# Patient Record
Sex: Male | Born: 1940 | Race: White | Hispanic: No | Marital: Married | Smoking: Former smoker
Health system: Southern US, Community
[De-identification: ages and names within clinical notes are randomized; demographics above are authoritative.]

## PROBLEM LIST (undated history)

## (undated) DIAGNOSIS — M1990 Unspecified osteoarthritis, unspecified site: Secondary | ICD-10-CM

## (undated) DIAGNOSIS — T7840XA Allergy, unspecified, initial encounter: Secondary | ICD-10-CM

## (undated) DIAGNOSIS — R3129 Other microscopic hematuria: Secondary | ICD-10-CM

## (undated) DIAGNOSIS — Z87438 Personal history of other diseases of male genital organs: Secondary | ICD-10-CM

## (undated) DIAGNOSIS — K219 Gastro-esophageal reflux disease without esophagitis: Secondary | ICD-10-CM

## (undated) DIAGNOSIS — Z8719 Personal history of other diseases of the digestive system: Secondary | ICD-10-CM

## (undated) DIAGNOSIS — G4730 Sleep apnea, unspecified: Secondary | ICD-10-CM

## (undated) DIAGNOSIS — I491 Atrial premature depolarization: Secondary | ICD-10-CM

## (undated) DIAGNOSIS — Z83719 Family history of colon polyps, unspecified: Secondary | ICD-10-CM

## (undated) DIAGNOSIS — I493 Ventricular premature depolarization: Secondary | ICD-10-CM

## (undated) DIAGNOSIS — Z87442 Personal history of urinary calculi: Secondary | ICD-10-CM

## (undated) DIAGNOSIS — N400 Enlarged prostate without lower urinary tract symptoms: Secondary | ICD-10-CM

## (undated) DIAGNOSIS — H269 Unspecified cataract: Secondary | ICD-10-CM

## (undated) DIAGNOSIS — Z973 Presence of spectacles and contact lenses: Secondary | ICD-10-CM

## (undated) DIAGNOSIS — I10 Essential (primary) hypertension: Secondary | ICD-10-CM

## (undated) DIAGNOSIS — Z8371 Family history of colonic polyps: Secondary | ICD-10-CM

## (undated) DIAGNOSIS — N200 Calculus of kidney: Secondary | ICD-10-CM

## (undated) DIAGNOSIS — G4733 Obstructive sleep apnea (adult) (pediatric): Secondary | ICD-10-CM

## (undated) HISTORY — PX: BACK SURGERY: SHX140

## (undated) HISTORY — DX: Atrial premature depolarization: I49.1

## (undated) HISTORY — PX: TRACHEOSTOMY: SUR1362

## (undated) HISTORY — DX: Family history of colon polyps, unspecified: Z83.719

## (undated) HISTORY — DX: Family history of colonic polyps: Z83.71

## (undated) HISTORY — DX: Essential (primary) hypertension: I10

## (undated) HISTORY — DX: Allergy, unspecified, initial encounter: T78.40XA

## (undated) HISTORY — DX: Obstructive sleep apnea (adult) (pediatric): G47.33

## (undated) HISTORY — DX: Unspecified cataract: H26.9

## (undated) HISTORY — DX: Ventricular premature depolarization: I49.3

## (undated) HISTORY — DX: Sleep apnea, unspecified: G47.30

## (undated) HISTORY — PX: TONSILLECTOMY: SUR1361

---

## 1994-04-04 HISTORY — PX: ANTERIOR CERVICAL DECOMP/DISCECTOMY FUSION: SHX1161

## 1998-07-17 ENCOUNTER — Ambulatory Visit (HOSPITAL_COMMUNITY): Admission: RE | Admit: 1998-07-17 | Discharge: 1998-07-17 | Payer: Self-pay | Admitting: *Deleted

## 2001-04-04 HISTORY — PX: WRIST GANGLION EXCISION: SUR520

## 2002-09-09 ENCOUNTER — Emergency Department (HOSPITAL_COMMUNITY): Admission: EM | Admit: 2002-09-09 | Discharge: 2002-09-09 | Payer: Self-pay | Admitting: Emergency Medicine

## 2002-09-09 ENCOUNTER — Encounter: Payer: Self-pay | Admitting: Emergency Medicine

## 2002-11-19 ENCOUNTER — Encounter: Admission: RE | Admit: 2002-11-19 | Discharge: 2003-02-17 | Payer: Self-pay | Admitting: Orthopedic Surgery

## 2003-12-30 ENCOUNTER — Encounter: Admission: RE | Admit: 2003-12-30 | Discharge: 2003-12-30 | Payer: Self-pay | Admitting: Family Medicine

## 2004-02-04 ENCOUNTER — Inpatient Hospital Stay (HOSPITAL_COMMUNITY): Admission: AD | Admit: 2004-02-04 | Discharge: 2004-02-05 | Payer: Self-pay | Admitting: Neurosurgery

## 2004-02-04 HISTORY — PX: CERVICAL LAMINECTOMY: SHX94

## 2004-06-07 ENCOUNTER — Ambulatory Visit: Payer: Self-pay | Admitting: Family Medicine

## 2004-10-27 ENCOUNTER — Ambulatory Visit: Payer: Self-pay | Admitting: Family Medicine

## 2004-11-08 ENCOUNTER — Ambulatory Visit: Payer: Self-pay | Admitting: Family Medicine

## 2004-11-17 ENCOUNTER — Encounter: Admission: RE | Admit: 2004-11-17 | Discharge: 2004-11-17 | Payer: Self-pay | Admitting: Family Medicine

## 2004-11-23 ENCOUNTER — Ambulatory Visit: Payer: Self-pay | Admitting: Gastroenterology

## 2004-12-03 ENCOUNTER — Encounter (INDEPENDENT_AMBULATORY_CARE_PROVIDER_SITE_OTHER): Payer: Self-pay | Admitting: *Deleted

## 2004-12-03 ENCOUNTER — Ambulatory Visit: Payer: Self-pay | Admitting: Gastroenterology

## 2005-03-01 ENCOUNTER — Ambulatory Visit: Payer: Self-pay | Admitting: Family Medicine

## 2005-04-04 HISTORY — PX: OTHER SURGICAL HISTORY: SHX169

## 2005-11-09 ENCOUNTER — Ambulatory Visit: Payer: Self-pay | Admitting: Family Medicine

## 2005-12-27 ENCOUNTER — Ambulatory Visit: Payer: Self-pay | Admitting: Family Medicine

## 2006-12-27 ENCOUNTER — Telehealth: Payer: Self-pay | Admitting: Family Medicine

## 2006-12-29 ENCOUNTER — Encounter: Payer: Self-pay | Admitting: Family Medicine

## 2007-08-20 ENCOUNTER — Encounter: Payer: Self-pay | Admitting: Family Medicine

## 2007-08-21 ENCOUNTER — Encounter: Payer: Self-pay | Admitting: Family Medicine

## 2007-08-28 ENCOUNTER — Ambulatory Visit: Payer: Self-pay | Admitting: Family Medicine

## 2007-08-28 DIAGNOSIS — Z86010 Personal history of colonic polyps: Secondary | ICD-10-CM

## 2007-08-28 DIAGNOSIS — N410 Acute prostatitis: Secondary | ICD-10-CM

## 2007-08-28 DIAGNOSIS — Z87442 Personal history of urinary calculi: Secondary | ICD-10-CM

## 2007-08-28 LAB — CONVERTED CEMR LAB
Bilirubin Urine: NEGATIVE
Blood in Urine, dipstick: NEGATIVE
Glucose, Urine, Semiquant: NEGATIVE
Ketones, urine, test strip: NEGATIVE
Nitrite: NEGATIVE
Protein, U semiquant: NEGATIVE
Specific Gravity, Urine: <1.005
Urobilinogen, UA: 0.2
WBC Urine, dipstick: NEGATIVE
pH: 5.5

## 2007-09-03 ENCOUNTER — Ambulatory Visit: Payer: Self-pay | Admitting: Family Medicine

## 2007-09-03 LAB — CONVERTED CEMR LAB
Bilirubin Urine: NEGATIVE
Glucose, Urine, Semiquant: NEGATIVE
Ketones, urine, test strip: NEGATIVE
Nitrite: NEGATIVE
Protein, U semiquant: NEGATIVE
Specific Gravity, Urine: <1.005
Urobilinogen, UA: 0.2
WBC Urine, dipstick: NEGATIVE
pH: 6

## 2007-09-04 ENCOUNTER — Encounter: Payer: Self-pay | Admitting: Family Medicine

## 2008-01-01 ENCOUNTER — Encounter: Payer: Self-pay | Admitting: Family Medicine

## 2009-01-08 ENCOUNTER — Ambulatory Visit: Payer: Self-pay | Admitting: Family Medicine

## 2009-03-02 ENCOUNTER — Ambulatory Visit: Payer: Self-pay | Admitting: Family Medicine

## 2009-03-02 DIAGNOSIS — N401 Enlarged prostate with lower urinary tract symptoms: Secondary | ICD-10-CM

## 2009-03-02 DIAGNOSIS — N138 Other obstructive and reflux uropathy: Secondary | ICD-10-CM | POA: Insufficient documentation

## 2009-03-02 DIAGNOSIS — K625 Hemorrhage of anus and rectum: Secondary | ICD-10-CM

## 2009-03-04 LAB — CONVERTED CEMR LAB: PSA: 1.26 ng/mL (ref 0.10–4.00)

## 2009-03-12 ENCOUNTER — Encounter (INDEPENDENT_AMBULATORY_CARE_PROVIDER_SITE_OTHER): Payer: Self-pay | Admitting: *Deleted

## 2009-04-10 ENCOUNTER — Ambulatory Visit: Payer: Self-pay | Admitting: Gastroenterology

## 2009-04-10 LAB — CONVERTED CEMR LAB
Basophils Absolute: 0 10*3/uL (ref 0.0–0.1)
Basophils Relative: 0.7 % (ref 0.0–3.0)
Eosinophils Absolute: 0.2 10*3/uL (ref 0.0–0.7)
Eosinophils Relative: 5.4 % — ABNORMAL HIGH (ref 0.0–5.0)
HCT: 48.7 % (ref 39.0–52.0)
Hemoglobin: 16 g/dL (ref 13.0–17.0)
Lymphocytes Relative: 21.6 % (ref 12.0–46.0)
Lymphs Abs: 0.9 10*3/uL (ref 0.7–4.0)
MCHC: 32.7 g/dL (ref 30.0–36.0)
MCV: 94.3 fL (ref 78.0–100.0)
Monocytes Absolute: 0.4 10*3/uL (ref 0.1–1.0)
Monocytes Relative: 10.7 % (ref 3.0–12.0)
Neutro Abs: 2.7 10*3/uL (ref 1.4–7.7)
Neutrophils Relative %: 61.6 % (ref 43.0–77.0)
Platelets: 157 10*3/uL (ref 150.0–400.0)
RBC: 5.17 M/uL (ref 4.22–5.81)
RDW: 12.1 % (ref 11.5–14.6)
WBC: 4.2 10*3/uL — ABNORMAL LOW (ref 4.5–10.5)

## 2009-04-15 ENCOUNTER — Ambulatory Visit: Payer: Self-pay | Admitting: Gastroenterology

## 2009-04-15 LAB — HM COLONOSCOPY

## 2009-04-16 ENCOUNTER — Encounter: Payer: Self-pay | Admitting: Gastroenterology

## 2009-04-21 ENCOUNTER — Encounter: Payer: Self-pay | Admitting: Gastroenterology

## 2009-04-24 ENCOUNTER — Ambulatory Visit: Payer: Self-pay | Admitting: Family Medicine

## 2009-04-24 DIAGNOSIS — K5190 Ulcerative colitis, unspecified, without complications: Secondary | ICD-10-CM | POA: Insufficient documentation

## 2009-04-24 DIAGNOSIS — M1990 Unspecified osteoarthritis, unspecified site: Secondary | ICD-10-CM

## 2009-05-22 ENCOUNTER — Ambulatory Visit: Payer: Self-pay | Admitting: Gastroenterology

## 2009-07-09 ENCOUNTER — Encounter (INDEPENDENT_AMBULATORY_CARE_PROVIDER_SITE_OTHER): Payer: Self-pay | Admitting: *Deleted

## 2009-08-10 ENCOUNTER — Ambulatory Visit: Payer: Self-pay | Admitting: Family Medicine

## 2009-08-10 DIAGNOSIS — J019 Acute sinusitis, unspecified: Secondary | ICD-10-CM

## 2009-08-26 ENCOUNTER — Ambulatory Visit: Payer: Self-pay | Admitting: Gastroenterology

## 2010-05-02 LAB — CONVERTED CEMR LAB
ALT: 22 units/L (ref 0–53)
AST: 20 units/L (ref 0–37)
Albumin: 4.3 g/dL (ref 3.5–5.2)
Alkaline Phosphatase: 72 units/L (ref 39–117)
BUN: 22 mg/dL (ref 6–23)
Basophils Absolute: 0 10*3/uL (ref 0.0–0.1)
Basophils Relative: 0.3 % (ref 0.0–3.0)
Bilirubin, Direct: 0.2 mg/dL (ref 0.0–0.3)
CO2: 32 meq/L (ref 19–32)
Calcium: 9.5 mg/dL (ref 8.4–10.5)
Chloride: 105 meq/L (ref 96–112)
Cholesterol: 185 mg/dL (ref 0–200)
Creatinine, Ser: 0.8 mg/dL (ref 0.4–1.5)
Eosinophils Absolute: 0.2 10*3/uL (ref 0.0–0.7)
Eosinophils Relative: 5.2 % — ABNORMAL HIGH (ref 0.0–5.0)
GFR calc non Af Amer: 101.96 mL/min (ref >60)
Glucose, Bld: 85 mg/dL (ref 70–99)
HCT: 47.6 % (ref 39.0–52.0)
HDL: 40.4 mg/dL (ref >39.00)
Hemoglobin: 15.7 g/dL (ref 13.0–17.0)
LDL Cholesterol: 133 mg/dL — ABNORMAL HIGH (ref 0–99)
Lymphocytes Relative: 21.3 % (ref 12.0–46.0)
Lymphs Abs: 0.9 10*3/uL (ref 0.7–4.0)
MCHC: 33.1 g/dL (ref 30.0–36.0)
MCV: 95 fL (ref 78.0–100.0)
Monocytes Absolute: 0.4 10*3/uL (ref 0.1–1.0)
Monocytes Relative: 10.6 % (ref 3.0–12.0)
Neutro Abs: 2.5 10*3/uL (ref 1.4–7.7)
Neutrophils Relative %: 62.6 % (ref 43.0–77.0)
Platelets: 158 10*3/uL (ref 150.0–400.0)
Potassium: 4 meq/L (ref 3.5–5.1)
RBC: 5.01 M/uL (ref 4.22–5.81)
RDW: 12 % (ref 11.5–14.6)
Sodium: 142 meq/L (ref 135–145)
TSH: 0.96 microintl units/mL (ref 0.35–5.50)
Total Bilirubin: 1.1 mg/dL (ref 0.3–1.2)
Total CHOL/HDL Ratio: 5
Total Protein: 7 g/dL (ref 6.0–8.3)
Triglycerides: 59 mg/dL (ref 0.0–149.0)
VLDL: 11.8 mg/dL (ref 0.0–40.0)
WBC: 4 10*3/uL — ABNORMAL LOW (ref 4.5–10.5)

## 2010-05-04 NOTE — Procedures (Signed)
Summary: Colonoscopy  Patient: Alex Weiss Note: All result statuses are Final unless otherwise noted.  Tests: (1) Colonoscopy (COL)   COL Colonoscopy           DONE     [facility]     [address]     [place], [place]  [address]           COLONOSCOPY PROCEDURE REPORT           PATIENT:  Alex Weiss  MR#:  [id number]     BIRTHDATE:  November 27, 1940, 68 yrs. old  GENDER:  male     ENDOSCOPIST:  Rachael Fee, MD           PROCEDURE DATE:  04/15/2009     PROCEDURE:  Colonoscopy with biopsy     ASA CLASS:  Class II     INDICATIONS:  recent change in bowel habits, intermitten bleeding     and urgency; brother had colon cancer; had 2006 colonoscopy     without polyps, cancers           MEDICATIONS:   Fentanyl 50 mcg IV, Versed 7 mg IV           DESCRIPTION OF PROCEDURE:   After the risks benefits and     alternatives of the procedure were thoroughly explained, informed     consent was obtained.  Digital rectal exam was performed and     revealed no rectal masses.   The LB [id number] endoscope     was introduced through the anus and advanced to the terminal ileum     which was intubated for a short distance, without limitations.     The quality of the prep was good, using MoviPrep.  The instrument     was then slowly withdrawn as the colon was fully examined.     <<PROCEDUREIMAGES>>           FINDINGS: There was moderate inflammation from anus to 30cm from     anus at which point there was a fairly distinct transition to     normal appearing mucosa. In the affected area, the mucosa was     erythematous, friable, granula appearing. There were no ulcers     (see image7). Biopsies were taken and sent to pathology  Mild     diverticulosis was found sigmoid to descending colon segments.     The terminal ileum appeared normal (see image5).  This was     otherwise a normal examination of the colon (see image4 and     image3).   Retroflexed views in the rectum revealed  no     abnormalities.    The scope was then withdrawn from the patient     and the procedure completed.           COMPLICATIONS:  None           ENDOSCOPIC IMPRESSION:     1) Moderate appearing colitis from anus to 30cm from anus.     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  Biopsies were taken.     2) Mild diverticulosis in the sigmoid to descending colon     segments.     3) Normal terminal ileum     4) Otherwise normal examination           RECOMMENDATIONS:     Given your significant family history of colon cancer, you     should have a repeat colonoscopy in 5 years  Dr. Christella Hartigan has called in Rowasa enemas, you should use one enema     at bedtime every night.     Dr. Christella Hartigan office will get in touch with you about a return     office visit in 4-5 weeks to see how the new medicine is helping.                 REPEAT EXAM:  5 years           ______________________________     Rachael Fee, MD           cc: Gershon Crane, MD           n.     Rosalie Doctor:   Rachael Fee at 04/15/2009 02:20 PM           [name], [id number]  Note: An exclamation mark (!) indicates a result that was not dispersed into the flowsheet. Document Creation Date: 04/15/2009 2:20 PM _______________________________________________________________________  (1) Order result status: Final Collection or observation date-time: 04/15/2009 14:11 Requested date-time:  Receipt date-time:  Reported date-time:  Referring Physician:   Ordering Physician: Rob Bunting [phone]) Specimen Source:  Source: Launa Grill Order Number: [id number] Lab site:   Appended Document: Colonoscopy     Procedures Next Due Date:    Colonoscopy: 04/2014

## 2010-05-04 NOTE — Assessment & Plan Note (Signed)
Summary: EMP//SLM   Vital Signs:  Patient profile:   70 year old male Height:      73 inches Weight:      218 pounds Temp:     97.8 degrees F oral Pulse rate:   74 / minute BP sitting:   134 / 90  (left arm) Cuff size:   large  Vitals Entered By: Alfred Levins, CMA (April 24, 2009 9:00 AM) CC: cpx, fasting   History of Present Illness: 70 yr old male for cpx. He recently had a colonoscopy for bloody stools which showed chronic colitis with no polyps, and hewas started by Dr. Christella Hartigan on nightly Rowasa enemas. These have been succesful, and he feels fine. He had a normal PSA on 03-02-09. Still walks for exercise.  Current Medications (verified): 1)  Sulindac 200 Mg  Tabs (Sulindac) .... Once Daily 2)  Skelaxin 800 Mg Tabs (Metaxalone) .... Take 1/2 Tablet By Mouth As Needed 3)  Folic Acid 1 Mg Tabs (Folic Acid) .... Once Daily 4)  Multivitamins  Tabs (Multiple Vitamin) .... Once Daily 5)  Aspir-Low 81 Mg Tbec (Aspirin) .... Once Daily 6)  Moviprep 100 Gm  Solr (Peg-Kcl-Nacl-Nasulf-Na Asc-C) .... As Per Prep Instructions. 7)  Rowasa 4 Gm  Kit (Mesalamine-Cleanser) .... Take One Enema Nightly, At Bedtime  Allergies (verified): 1)  ! Pcn 2)  ! * Ivp Dye  Past History:  Past Medical History: Nephrolithiasis, hx of Colonic polyps, hx of Chickenpox ulcerative colitis, sees Dr. Wendall Papa  Past Surgical History: Colonoscopy 04-15-09 per Dr. Christella Hartigan, showed chronic colitis with no polyps, repeat in 5 yrs Tendon repair right  wrist C4 & C5 fusion Tracheostomy Tonsillectomy  Family History: Reviewed history from 04/10/2009 and no changes required. Family History of Arthritis Family History of CAD Male 1st degree relative <50 Family History Depression Family History Hypertension Family History Lung cancer Family History of Stroke M 1st degree relative <50 Son committed suicide brother had colon cancer in his late 84s  Social History: Reviewed history from 04/10/2009 and  no changes required. Married Alcohol use-yes Drug use-no Former smoker   Review of Systems  The patient denies anorexia, fever, weight loss, weight gain, vision loss, decreased hearing, hoarseness, chest pain, syncope, dyspnea on exertion, peripheral edema, prolonged cough, headaches, hemoptysis, abdominal pain, melena, hematochezia, severe indigestion/heartburn, hematuria, incontinence, genital sores, muscle weakness, suspicious skin lesions, transient blindness, difficulty walking, depression, unusual weight change, abnormal bleeding, enlarged lymph nodes, angioedema, breast masses, and testicular masses.    Physical Exam  General:  Well-developed,well-nourished,in no acute distress; alert,appropriate and cooperative throughout examination Head:  Normocephalic and atraumatic without obvious abnormalities. No apparent alopecia or balding. Eyes:  No corneal or conjunctival inflammation noted. EOMI. Perrla. Funduscopic exam benign, without hemorrhages, exudates or papilledema. Vision grossly normal. Ears:  External ear exam shows no significant lesions or deformities.  Otoscopic examination reveals clear canals, tympanic membranes are intact bilaterally without bulging, retraction, inflammation or discharge. Hearing is grossly normal bilaterally. Nose:  External nasal examination shows no deformity or inflammation. Nasal mucosa are pink and moist without lesions or exudates. Mouth:  Oral mucosa and oropharynx without lesions or exudates.  Teeth in good repair. Neck:  No deformities, masses, or tenderness noted. Chest Wall:  No deformities, masses, tenderness or gynecomastia noted. Lungs:  Normal respiratory effort, chest expands symmetrically. Lungs are clear to auscultation, no crackles or wheezes. Heart:  Normal rate and regular rhythm. S1 and S2 normal without gallop, murmur, click, rub or other extra  sounds. EKG normal Abdomen:  Bowel sounds positive,abdomen soft and non-tender without  masses, organomegaly or hernias noted. Genitalia:  Testes bilaterally descended without nodularity, tenderness or masses. No scrotal masses or lesions. No penis lesions or urethral discharge. Msk:  No deformity or scoliosis noted of thoracic or lumbar spine.   Pulses:  R and L carotid,radial,femoral,dorsalis pedis and posterior tibial pulses are full and equal bilaterally Extremities:  No clubbing, cyanosis, edema, or deformity noted with normal full range of motion of all joints.   Neurologic:  No cranial nerve deficits noted. Station and gait are normal. Plantar reflexes are down-going bilaterally. DTRs are symmetrical throughout. Sensory, motor and coordinative functions appear intact. Skin:  Intact without suspicious lesions or rashes Cervical Nodes:  No lymphadenopathy noted Axillary Nodes:  No palpable lymphadenopathy Inguinal Nodes:  No significant adenopathy Psych:  Cognition and judgment appear intact. Alert and cooperative with normal attention span and concentration. No apparent delusions, illusions, hallucinations   Impression & Recommendations:  Problem # 1:  RECTAL BLEEDING (ICD-569.3)  Problem # 2:  NEPHROLITHIASIS, HX OF (ICD-V13.01)  Problem # 3:  COLITIS, ULCERATIVE (ICD-556.9)  Orders: Venipuncture ([address]) TLB-Lipid Panel (80061-LIPID) TLB-BMP (Basic Metabolic Panel-BMET) (80048-METABOL) TLB-CBC Platelet - w/Differential (85025-CBCD) TLB-Hepatic/Liver Function Pnl (80076-HEPATIC) TLB-TSH (Thyroid Stimulating Hormone) (84443-TSH) UA Dipstick w/o Micro (automated)  (81003)  Problem # 4:  DEGENERATIVE JOINT DISEASE (ICD-715.90)  His updated medication list for this problem includes:    Sulindac 200 Mg Tabs (Sulindac) ..... Once daily    Aspir-low 81 Mg Tbec (Aspirin) ..... Once daily  Problem # 5:  FAMILY HISTORY OF CAD MALE 1ST DEGREE RELATIVE <50 (ICD-V17.3)  Orders: EKG w/ Interpretation (93000)  Complete Medication List: 1)  Sulindac 200 Mg Tabs  (Sulindac) .... Once daily 2)  Skelaxin 800 Mg Tabs (Metaxalone) .Marland Kitchen.. 1 q 6 hours as needed spasm 3)  Folic Acid 1 Mg Tabs (Folic acid) .... Once daily 4)  Multivitamins Tabs (Multiple vitamin) .... Once daily 5)  Aspir-low 81 Mg Tbec (Aspirin) .... Once daily 6)  Rowasa 4 Gm Kit (Mesalamine-cleanser) .... Take one enema nightly, at bedtime 7)  Famotidine 20 Mg Tabs (Famotidine) .... Once daily  Patient Instructions: 1)  get labs Prescriptions: SKELAXIN 800 MG TABS (METAXALONE) 1 q 6 hours as needed spasm  #120 x 11   Entered and Authorized by:   Nelwyn Salisbury MD   Signed by:   Nelwyn Salisbury MD on 04/24/2009   Method used:   Print then Give to Patient   RxID:   [id number]    Appended Document: EMP//SLM     Allergies: 1)  ! Pcn 2)  ! * Ivp Dye   Complete Medication List: 1)  Sulindac 200 Mg Tabs (Sulindac) .... Once daily 2)  Skelaxin 800 Mg Tabs (Metaxalone) .Marland Kitchen.. 1 q 6 hours as needed spasm 3)  Folic Acid 1 Mg Tabs (Folic acid) .... Once daily 4)  Multivitamins Tabs (Multiple vitamin) .... Once daily 5)  Aspir-low 81 Mg Tbec (Aspirin) .... Once daily 6)  Rowasa 4 Gm Kit (Mesalamine-cleanser) .... Take one enema nightly, at bedtime 7)  Famotidine 20 Mg Tabs (Famotidine) .... Once daily   Laboratory Results   Urine Tests  Date/Time Received: April 24, 2009   Routine Urinalysis   Color: yellow Appearance: Clear Glucose: negative   (Normal Range: Negative) Bilirubin: negative   (Normal Range: Negative) Ketone: negative   (Normal Range: Negative) Spec. Gravity: 1.020   (Normal Range: 1.003-1.035) Blood: negative   (  Normal Range: Negative) pH: 7.0   (Normal Range: 5.0-8.0) Protein: negative   (Normal Range: Negative) Urobilinogen: 0.2   (Normal Range: 0-1) Nitrite: negative   (Normal Range: Negative) Leukocyte Esterace: negative   (Normal Range: Negative)    Comments: Kern Reap CMA Duncan Dull)  April 24, 2009 12:29 PM

## 2010-05-04 NOTE — Miscellaneous (Signed)
Summary: rx  Clinical Lists Changes  Medications: Added new medication of ROWASA 4 GM  KIT (MESALAMINE-CLEANSER) take one enema nightly, at bedtime - Signed Rx of ROWASA 4 GM  KIT (MESALAMINE-CLEANSER) take one enema nightly, at bedtime;  #30 x 3;  Signed;  Entered by: Rachael Fee MD;  Authorized by: Rachael Fee MD;  Method used: Electronically to [id number]*, [address] [place], [place], [place]  [address], Ph: [phone], Fax: [phone]    Prescriptions: ROWASA 4 GM  KIT (MESALAMINE-CLEANSER) take one enema nightly, at bedtime  #30 x 3   Entered and Authorized by:   Rachael Fee MD   Signed by:   Rachael Fee MD on 04/15/2009   Method used:   Electronically to        [facility] [phone]* (retail)       [address]       [place], [place]  [address]       Ph: [phone]       Fax: [phone]   RxID:   [id number]

## 2010-05-04 NOTE — Assessment & Plan Note (Signed)
  Review of gastrointestinal problems: 1. Left sided colitis (30cm) colonoscopy with Dr. Christella Hartigan 04/2009, Normal terminal ileum (Biopsies confirmed chronic, active disease).  Symptoms of bleeding, mild urgency were well-controlled on mesalamine enemas. 2. Family history of colon cancer, brother in his 64s; No polyps on 2011 colonoscopy, next colonoscopy 2016 3. Diverticulosis    History of Present Illness Visit Type: Follow-up Visit Primary GI MD: Rob Bunting MD Primary Provider: Gershon Crane, MD Requesting Provider: na Chief Complaint: 4-5 week f/u History of Present Illness:     since colonoscopy last month, has been doing mesalmine enemas nightly.  Bleeding has stopped.  NO urgency.  He feels fine.           Current Medications (verified): 1)  Sulindac 200 Mg  Tabs (Sulindac) .... Once Daily 2)  Skelaxin 800 Mg Tabs (Metaxalone) .Marland Kitchen.. 1 Q 6 Hours As Needed Spasm 3)  Folic Acid 1 Mg Tabs (Folic Acid) .... Once Daily 4)  Multivitamins  Tabs (Multiple Vitamin) .... Once Daily 5)  Aspir-Low 81 Mg Tbec (Aspirin) .... Once Daily 6)  Rowasa 4 Gm  Kit (Mesalamine-Cleanser) .... Take One Enema Nightly, At Bedtime 7)  Famotidine 20 Mg Tabs (Famotidine) .... Once Daily  Allergies (verified): 1)  ! Pcn 2)  ! * Ivp Dye  Vital Signs:  Patient profile:   70 year old male Height:      73 inches Weight:      220 pounds BMI:     29.13 Pulse rate:   70 / minute Pulse rhythm:   regular BP sitting:   120 / 64  (left arm)  Vitals Entered By: Chales Abrahams CMA Duncan Dull) (May 22, 2009 1:43 PM)  Physical Exam  Additional Exam:  Constitutional: generally well appearing Psychiatric: alert and oriented times 3 Abdomen: soft, non-tender, non-distended, normal bowel sounds    Impression & Recommendations:  Problem # 1:  Left-sided colitis his colitis symptoms are under good control on mesalamine enemas, once nightly. He will continue nightly enemas for another 2 weeks and then try to  back down to every other night for about 2 months. He will return to see me in 3 months. He knows to call sooner if he is troubles.  Patient Instructions: 1)  Continue nightly enemas for another 2 weeks, then decrease to every other night for 2 months. 2)  Return to see Dr. Christella Hartigan in 3 months, sooner if needed. 3)  A copy of this information will be sent to Dr. Clent Ridges. 4)  The medication list was reviewed and reconciled.  All changed / newly prescribed medications were explained.  A complete medication list was provided to the patient / caregiver.

## 2010-05-04 NOTE — Assessment & Plan Note (Signed)
  Review of gastrointestinal problems: 1. Left sided colitis (30cm) colonoscopy with Dr. Christella Hartigan 04/2009, Normal terminal ileum (Biopsies confirmed chronic, active disease).  Symptoms of bleeding, mild urgency were well-controlled on mesalamine enemas.  May, 2011 stopped enemas after tapering to every other day without any problem. 2. Family history of colon cancer, brother in his 14s; No polyps on 2011 colonoscopy, next colonoscopy 2016 3. Diverticulosis    History of Present Illness Visit Type: Follow-up Visit Primary GI MD: Rob Bunting MD Primary Provider: Gershon Crane, MD Requesting Provider: na Chief Complaint: No bleeding History of Present Illness:     very pleasant 70 year old man whom I last saw about 3 months ago. Since then he did every other day enemas and stopped 2-3 weeks ago and has had NO problems at all.  No bleeding, no urgency, no diarrhea.  Feels very good.           Current Medications (verified): 1)  Sulindac 200 Mg  Tabs (Sulindac) .... Once Daily 2)  Skelaxin 800 Mg Tabs (Metaxalone) .Marland Kitchen.. 1 Q 6 Hours As Needed Spasm 3)  Folic Acid 1 Mg Tabs (Folic Acid) .... Once Daily 4)  Multivitamins  Tabs (Multiple Vitamin) .... Once Daily 5)  Aspir-Low 81 Mg Tbec (Aspirin) .... Once Daily 6)  Famotidine 20 Mg Tabs (Famotidine) .... Once Daily  Allergies (verified): 1)  ! Pcn 2)  ! * Ivp Dye  Vital Signs:  Patient profile:   70 year old male Height:      73 inches Weight:      220 pounds BMI:     29.13 Pulse rate:   64 / minute Pulse rhythm:   regular BP sitting:   146 / 78  (left arm) Cuff size:   regular  Vitals Entered By: June McMurray CMA Duncan Dull) (Aug 26, 2009 8:39 AM)  Physical Exam  Additional Exam:  Constitutional: generally well appearing Psychiatric: alert and oriented times 3 Abdomen: soft, non-tender, non-distended, normal bowel sounds    Impression & Recommendations:  Problem # 1:  mild  proctitis his symptoms have been well-controlled  on mesalamine topically. He was able to taper off of the enemas and has been off for about 2-3 weeks now without any recurrence of symptoms. He will call me if he has any return of symptoms at that point I would likely restart him on nightly mesalamine enemas.  Patient Instructions: 1)  [facility] [place] (ccfa) is a very good resource. 2)  Call Dr. Christella Hartigan' office if you have flare of bleeding, problems.   3)  A copy of this information will be sent to Dr. Clent Ridges. 4)  The medication list was reviewed and reconciled.  All changed / newly prescribed medications were explained.  A complete medication list was provided to the patient / caregiver.

## 2010-05-04 NOTE — Letter (Signed)
Summary: Results Letter  [facility]  [address] [place], [place] [address]   Phone: [phone]  Fax: [phone]        April 21, 2009 MRN: [id number]    [facility] [address] [place], [place]  [address]    Dear [name],   The colon biopsies done on your recent colonoscopy confirmed chronic inflammation in your colon.  I hope that you have noticed an improvement since starting the enemas and look forward to seeing you in the office in follow up in the next few weeks.  Given your family history of colon cancer you will need a repeat colonoscopy in 5 years.  We will put your information in our reminder system and contact you around that time.       Sincerely,  Rachael Fee MD  This letter has been electronically signed by your physician.  Appended Document: Results Letter Letter mailed 1.21.11

## 2010-05-04 NOTE — Letter (Signed)
Summary: [facility] Instructions  [facility]  [address] [place], [place] [address]   Phone: [phone]  Fax: [phone]       QUINTARIUS FERNS    17-Sep-1954    MRN: [id number]        Procedure Day /Date:04/15/09  WED     Arrival Time:1 pm     Procedure Time:2 pm     Location of Procedure:                     X  [facility] ( Outpatient Registration)   PREPARATION FOR COLONOSCOPY WITH MOVIPREP   Starting 5 days prior to your procedure 04/10/09 do not eat nuts, seeds, popcorn, corn, beans, peas,  salads, or any raw vegetables.  Do not take any fiber supplements (e.g. Metamucil, Citrucel, and Benefiber).  THE DAY BEFORE YOUR PROCEDURE         DATE: 04/14/09  DAY: TUE  1.  Drink clear liquids the entire day-NO SOLID FOOD  2.  Do not drink anything colored red or purple.  Avoid juices with pulp.  No orange juice.  3.  Drink at least 64 oz. (8 glasses) of fluid/clear liquids during the day to prevent dehydration and help the prep work efficiently.  CLEAR LIQUIDS INCLUDE: Water Jello Ice Popsicles Tea (sugar ok, no milk/cream) Powdered fruit flavored drinks Coffee (sugar ok, no milk/cream) Gatorade Juice: apple, white grape, white cranberry  Lemonade Clear bullion, consomm, broth Carbonated beverages (any kind) Strained chicken noodle soup Hard Candy                             4.  In the morning, mix first dose of MoviPrep solution:    Empty 1 Pouch A and 1 Pouch B into the disposable container    Add lukewarm drinking water to the top line of the container. Mix to dissolve    Refrigerate (mixed solution should be used within 24 hrs)  5.  Begin drinking the prep at 5:00 p.m. The MoviPrep container is divided by 4 marks.   Every 15 minutes drink the solution down to the next mark (approximately 8 oz) until the full liter is complete.   6.  Follow completed prep with 16 oz of clear liquid of your choice (Nothing red or purple).  Continue to  drink clear liquids until bedtime.  7.  Before going to bed, mix second dose of MoviPrep solution:    Empty 1 Pouch A and 1 Pouch B into the disposable container    Add lukewarm drinking water to the top line of the container. Mix to dissolve    Refrigerate  THE DAY OF YOUR PROCEDURE      DATE: 04/15/09 DAY: WED  Beginning at 9 a.m. (5 hours before procedure):         1. Every 15 minutes, drink the solution down to the next mark (approx 8 oz) until the full liter is complete.  2. Follow completed prep with 16 oz. of clear liquid of your choice.    3. You may drink clear liquids until 12 noon (2 HOURS BEFORE PROCEDURE).   MEDICATION INSTRUCTIONS  Unless otherwise instructed, you should take regular prescription medications with a small sip of water   as early as possible the morning of your procedure.  Diabetic patients - see separate instructions.           OTHER INSTRUCTIONS  You will need a responsible adult at least 70 years of age to accompany you and drive you home.   This person must remain in the waiting room during your procedure.  Wear loose fitting clothing that is easily removed.  Leave jewelry and other valuables at home.  However, you may wish to bring a book to read or  an iPod/MP3 player to listen to music as you wait for your procedure to start.  Remove all body piercing jewelry and leave at home.  Total time from sign-in until discharge is approximately 2-3 hours.  You should go home directly after your procedure and rest.  You can resume normal activities the  day after your procedure.  The day of your procedure you should not:   Drive   Make legal decisions   Operate machinery   Drink alcohol   Return to work  You will receive specific instructions about eating, activities and medications before you leave.    The above instructions have been reviewed and explained to me by   _______________________    I fully understand and can  verbalize these instructions _____________________________ Date _________  Appended Document: Moviprep Instructions instructions were corrected to say [facility] not [facility]

## 2010-05-04 NOTE — Assessment & Plan Note (Signed)
History of Present Illness Visit Type: Initial Consult Primary GI MD: Rob Bunting MD Primary Provider: Gershon Crane, MD Requesting Provider: Gershon Crane, MD Chief Complaint: rectal bleed History of Present Illness:     very pleasant 70 year old man who is brother had colon cancer in his late 78s. I performed a colonoscopy for this patient for half years ago, internal hemorrhoids, diverticulosis was noted but no polyps or cancers. He was scheduled for a recall colonoscopy to be done at this summer.  he Will have intrmittent rectal bleeding, uses Prep H with good relief.  Since october (was lifting alot for moving his family)  for a while was having blood in stools, blood on TP, urgency to move bowels.  Went on for 2 months.   Symptoms resolved but came back slightly this past week.he has not had blood counts checked, does not appear to be anemic clinically however.             Current Medications (verified): 1)  Sulindac 200 Mg  Tabs (Sulindac) .... Once Daily 2)  Skelaxin 800 Mg Tabs (Metaxalone) .... Take 1/2 Tablet By Mouth As Needed 3)  Folic Acid 1 Mg Tabs (Folic Acid) .... Once Daily 4)  Multivitamins  Tabs (Multiple Vitamin) .... Once Daily 5)  Aspir-Low 81 Mg Tbec (Aspirin) .... Once Daily  Allergies: 1)  ! Pcn 2)  ! * Ivp Dye  Family History: Family History of Arthritis Family History of CAD Male 1st degree relative <50 Family History Depression Family History Hypertension Family History Lung cancer Family History of Stroke M 1st degree relative <50 Son committed suicide brother had colon cancer in his late 88s  Social History: Married Alcohol use-yes Drug use-no Former smoker   Vital Signs:  Patient profile:   70 year old male Height:      75 inches Weight:      219.25 pounds BMI:     27.50 Pulse rate:   80 / minute Pulse rhythm:   regular BP sitting:   142 / 86  (left arm) Cuff size:   regular  Vitals Entered By: June McMurray CMA Duncan Dull)  (April 10, 2009 9:39 AM)  Physical Exam  Additional Exam:  Constitutional: generally well appearing Psychiatric: alert and oriented times 3 Abdomen: soft, non-tender, non-distended, normal bowel sounds     Impression & Recommendations:  Problem # 1:  Intermittent rectal bleeding, family history of colon cancer his bleeding this past October occurred around the time a lot of heavy lifting and I suspect was anorectal, probably related to known internal hemorrhoids. His symptoms of deftly quieted down. We will get a CBC today to make sure he is not anemic however he does not appear to be so clinically. He was due for recall colonoscopy for family history of colon cancer in the next 6-7 months and we will do that for him now instead.  Other Orders: TLB-CBC Platelet - w/Differential (85025-[id number])  Patient Instructions: 1)  You will be scheduled to have a colonoscopy. 2)  You will get lab test(s) done today (cbc). 3)  A copy of this information will be sent to Dr. Clent Ridges. 4)  The medication list was reviewed and reconciled.  All changed / newly prescribed medications were explained.  A complete medication list was provided to the patient / caregiver.  Appended Document: Orders Update/colon    Clinical Lists Changes  Medications: Added new medication of MOVIPREP 100 GM  SOLR (PEG-KCL-NACL-NASULF-NA ASC-C) As per prep instructions. - Signed  Rx of MOVIPREP 100 GM  SOLR (PEG-KCL-NACL-NASULF-NA ASC-C) As per prep instructions.;  #1 x 0;  Signed;  Entered by: Chales Abrahams CMA (AAMA);  Authorized by: Rachael Fee MD;  Method used: Electronically to [id number]*, [address], [place], [place], [place]  [address], Ph: [phone], Fax: [phone] Orders: Added new Test order of Colonoscopy (Colon) - Signed    Prescriptions: MOVIPREP 100 GM  SOLR (PEG-KCL-NACL-NASULF-NA ASC-C) As per prep instructions.  #1 x 0   Entered by:   Chales Abrahams CMA (AAMA)   Authorized by:    Rachael Fee MD   Signed by:   Chales Abrahams CMA (AAMA) on 04/10/2009   Method used:   Electronically to        [facility] [phone]* (retail)       [address]       [place], [place]  [address]       Ph: [phone]       Fax: [phone]   RxID:   [id number]

## 2010-05-04 NOTE — Assessment & Plan Note (Signed)
Summary: head and chest congestion/sore throat/cjr   Vital Signs:  Patient profile:   70 year old male Weight:      216 pounds BMI:     28.60 Temp:     98.7 degrees F oral BP sitting:   126 / 86  (left arm) Cuff size:   regular  Vitals Entered By: Raechel Ache, RN (Aug 10, 2009 10:58 AM) CC: C/o head and chest congestion, sore throat, cough x 5 days   History of Present Illness: Here for 5 days of sinus pressure, HA, PND, ST, and a dry cough. No fever.   Allergies: 1)  ! Pcn 2)  ! * Ivp Dye  Past History:  Past Medical History: Reviewed history from 04/24/2009 and no changes required. Nephrolithiasis, hx of Colonic polyps, hx of Chickenpox ulcerative colitis, sees Dr. Wendall Papa  Review of Systems  The patient denies anorexia, fever, weight loss, weight gain, vision loss, decreased hearing, hoarseness, chest pain, syncope, dyspnea on exertion, peripheral edema, hemoptysis, abdominal pain, melena, hematochezia, severe indigestion/heartburn, hematuria, incontinence, genital sores, muscle weakness, suspicious skin lesions, transient blindness, difficulty walking, depression, unusual weight change, abnormal bleeding, enlarged lymph nodes, angioedema, breast masses, and testicular masses.    Physical Exam  General:  Well-developed,well-nourished,in no acute distress; alert,appropriate and cooperative throughout examination Head:  Normocephalic and atraumatic without obvious abnormalities. No apparent alopecia or balding. Eyes:  No corneal or conjunctival inflammation noted. EOMI. Perrla. Funduscopic exam benign, without hemorrhages, exudates or papilledema. Vision grossly normal. Ears:  External ear exam shows no significant lesions or deformities.  Otoscopic examination reveals clear canals, tympanic membranes are intact bilaterally without bulging, retraction, inflammation or discharge. Hearing is grossly normal bilaterally. Nose:  External nasal examination shows no  deformity or inflammation. Nasal mucosa are pink and moist without lesions or exudates. Mouth:  Oral mucosa and oropharynx without lesions or exudates.  Teeth in good repair. Neck:  No deformities, masses, or tenderness noted. Lungs:  Normal respiratory effort, chest expands symmetrically. Lungs are clear to auscultation, no crackles or wheezes.   Impression & Recommendations:  Problem # 1:  ACUTE SINUSITIS, UNSPECIFIED (ICD-461.9)  His updated medication list for this problem includes:    Zithromax Z-pak 250 Mg Tabs (Azithromycin) .Marland Kitchen... As directed  Orders: Prescription Created Electronically [phone])  Complete Medication List: 1)  Sulindac 200 Mg Tabs (Sulindac) .... Once daily 2)  Skelaxin 800 Mg Tabs (Metaxalone) .Marland Kitchen.. 1 q 6 hours as needed spasm 3)  Folic Acid 1 Mg Tabs (Folic acid) .... Once daily 4)  Multivitamins Tabs (Multiple vitamin) .... Once daily 5)  Aspir-low 81 Mg Tbec (Aspirin) .... Once daily 6)  Rowasa 4 Gm Kit (Mesalamine-cleanser) .... Take one enema nightly, at bedtime 7)  Famotidine 20 Mg Tabs (Famotidine) .... Once daily 8)  Zithromax Z-pak 250 Mg Tabs (Azithromycin) .... As directed  Patient Instructions: 1)  Please schedule a follow-up appointment as needed .  Prescriptions: ZITHROMAX Z-PAK 250 MG TABS (AZITHROMYCIN) as directed  #1 x 0   Entered and Authorized by:   Nelwyn Salisbury MD   Signed by:   Nelwyn Salisbury MD on 08/10/2009   Method used:   Electronically to        [facility] [phone]* (retail)       [address]       [place], [place]  [address]       Ph: [phone]  Fax: [phone]   RxID:   [id number]

## 2010-05-04 NOTE — Letter (Signed)
Summary: Appt Reminder 2  [facility]  [address] [place], [place] [address]   Phone: [phone]  Fax: [phone]        April 16, 2009 MRN: [id number]    [facility] [address] [place], [place]  [address]    Dear Mr. Alex Weiss,   You have a return appointment with Dr. Christella Hartigan on 05/22/09 at 1:45pm.  Please remember to bring a complete list of the medicines you are taking, your insurance card and your co-pay.  If you have to cancel or reschedule this appointment, please call before 5:00 pm the evening before to avoid a cancellation fee.  If you have any questions or concerns, please call [phone].    Sincerely,    Chales Abrahams CMA (AAMA)  Appended Document: Appt Reminder 2 letter mailed

## 2010-05-04 NOTE — Letter (Signed)
Summary: Office Visit Letter  [facility]  [address] [place], [place] [address]   Phone: [phone]  Fax: [phone]      July 09, 2009 MRN: [id number]   Alex Weiss [address] [place], [place]  [address]   Dear Mr. MAGALLANES,   According to our records, it is time for you to schedule a follow-up office visit with [place] in the month of May 2011.   At your convenience, please call [phone] (option #2)to schedule an office visit. If you have any questions, concerns, or feel that this letter is in error, we would appreciate your call.   Sincerely,  Rachael Fee, M.D.  [facility] [phone]

## 2010-08-20 NOTE — Op Note (Signed)
Alex Weiss, Alex Weiss NO.:  [id number]   MEDICAL RECORD NO.:  [id number]          PATIENT TYPE:  OIB   LOCATION:  2899                         FACILITY:  [facility]   PHYSICIAN:  Donalee Citrin, M.D.        DATE OF BIRTH:  27-Dec-1940   DATE OF PROCEDURE:  02/04/2004  DATE OF DISCHARGE:                                 OPERATIVE REPORT   PREOPERATIVE DIAGNOSIS:  C8 and T1 radiculopathy, right.   POSTOPERATIVE DIAGNOSIS:  C8 and T1 radiculopathy, right.   OPERATION PERFORMED:  C7-T1 laminectomy and foraminotomy with right C8 nerve  root and T1-2 laminotomy and foraminotomy, T1 nerve root.   SURGEON:  Donalee Citrin, M.D.   ASSISTANT:  Tia Alert, MD   ANESTHESIA:  General endotracheal.   INDICATIONS FOR PROCEDURE:  The patient is a very pleasant 70 year old  gentleman who has had longstanding progressive neck pain with right hand  weakness and numbness and progressive loss of dexterity.  Preoperative  imaging showed previous spinal fusion from C4 down to C7 with broad based  disk bulge and severe foraminal stenosis, C8 nerve root on the right as well  as soft tissue density and possible disk material with compression of the  right T1 nerve root.  These were all felt to be consistent with his clinical  syndrome, progressive worsening weakness and loss of dexterity of the right  hand and based on failure of conservative treatment and progressive  weakness, the patient was recommended decompression.  I extensively went  over the risks and benefits of surgery with him.  He understands and agreed  to proceed forward.   DESCRIPTION OF PROCEDURE:  The patient was brought to the operating room was  induced under general anesthesia, positioned prone in pins with the neck in  slight flexion.  The back side of the neck was prepped and draped in the  usual sterile fashion.  A preoperative x-ray localized the C7 spinous  process.  After infiltration of 10 mL of lidocaine with  epinephrine, a  midline incision was made subperiosteal dissection was carried out lamina of  C6, C7 and T1.  Intraoperative x-ray confirmed localization of C6-7 disk  space.  Laminotomy was begun inferior to this and one space below the  inferior aspect of the lamina of C7, superior aspect of lamina of T1, medial  aspect of facet complex was removed. The C8 nerve root was identified.  Then  the operating microscope was draped and brought onto the field for  microscopic illumination.  The remainder of the C8 nerve root was identified  and there was noted to be a marked amount of soft tissue compression with  degenerative facet complex compressing the proximal aspects of the C8 nerve  root.  This was all underbitten with a 1 mm Kerrison punch and the C8 nerve  root was decompressed out its neural foramen.  The titanium nerve hook was  used to explore underneath the C8 nerve root.  There was no palpable  ruptured disk material.  In the middle of the soft tissue overlying  the  proximal aspect, superior aspect of the C8 nerve root was all underbitten  creating a complete decompression of the C8 nerve root which was explored  with a __________ dissector and noted to be free and clear.  Then dissection  of  the T1 laminectomy was continued inferiorly to just below the T1 pedicle  and T1 nerve root was identified.  This was again neurovascularly  decompressed out its neural foramen.  The T1 nerve root was noted to be  markedly inflamed and stenotic.  The nerve hook was used to explore  underneath the T1 nerve root.  There was no disk material appreciated.  The  neural foramina was widely patent and the nerve root had been unroofed, so  it was felt this was adequate.  Again, both neural foramina re-explored and  noted to be adequately decompressed.  Then the wound was copiously irrigated  and meticulous hemostasis was maintained.  Gelfoam was overlaid atop the  dura.  The muscle and fascia were  reapproximated with 0 interrupted Vicryl.  Subcutaneous tissue closed with 2-0 interrupted Vicryl and the skin was  closed with a running 4-0 subcuticular.  Benzoin and Steri-Strips applied.  The patient was then transferred to the recovery room in stable condition.  At the end of the case, sponge, needle and instrument counts were correct.       GC/MEDQ  D:  02/04/2004  T:  02/04/2004  Job:  [id number]

## 2010-08-20 NOTE — Assessment & Plan Note (Signed)
[facility]                              [place] OFFICE NOTE   NAME:Alex Weiss, Alex Weiss                        MRN:          [id number]  DATE:11/09/2005                            DOB:          1940/12/15    This is a 70 year old gentleman here for complete physical exam.  He feels  fine and has no complaints at all.  His arthritis remains under good  control.  He remains active.  We did a physical about a year ago and noted  an elevated blood pressure. At that time I intended to start him on Lotrel  but, in fact, he never took it at all.  Instead he continued to watch his  pressures frequently at home and says that his blood pressures are quite  stable generally with systolics in the 120s and diastolics in the 70s.  Of  note, several years ago, we referred him to urology for work-up for  hematuria.  After normal CT scan, normal cystoscopy, etc., his problem was  felt to be benign.  He has had no signs of it since.  He also had a  colonoscopy no December 03, 2004 per Dr. Wendall Papa for rectal bleeding.  This was remarkable only for some hemorrhoids and some diverticulosis.  Again the patient has no bowel complaints and has seen no blood in his stool  for the past year.  Otherwise for details of his past medical history,  family history, social history, habits, etc., I refer you to our last  physical note dated November 08, 2004.   ALLERGIES:  PENICILLIN AND IVP DYE.   CURRENT MEDICATIONS:  1.  Clinoril 200 mg per day.  2.  Aspirin 81 mg per day.  3.  Multivitamins daily.  4.  Skelaxin 400 mg b.i.d. as needed for muscle stiffness.   OBJECTIVE:  VITAL SIGNS:  Height 6 feet 3 inches.  Weight 220. Blood  pressure 138/80, pulse 60 and regular.  GENERAL:  Appears to be healthy.  SKIN:  Free of significant lesions.  HEENT:  Eyes clear.  Ears clear.  Oropharynx normal.  NECK:  Supple without lymphadenopathy or masses.  LUNGS:  Clear.  CARDIAC:  Rate and  rhythm regular without gallops, murmurs, rubs.  Distal  pulses are full.  EKG within normal limits.  ABDOMEN:  Soft.  Normal bowel sounds, nontender, no masses.  GENITALIA:  Normal male.  RECTAL:  No mass or tenderness.  Prostate is smooth and mildly enlarged.  Stool Hemoccult negative.  EXTREMITIES:  No clubbing, cyanosis or edema.  NEUROLOGIC:  Grossly intact.   ASSESSMENT/PLAN:  Problem #1.  Complete physical.  He is fasting so we will  check the usual laboratories.  Problem #2.  History of rectal bleeding apparently stable.  Problem #3.  History of hematuria.  Will check a urinalysis today.  Problem #4.  Hypertension, stable off medications.  Problem #5.  Degenerative joint disease, stable.  Tera Mater. Clent Ridges, MD   SAF/MedQ  DD:  11/09/2005  DT:  11/09/2005  Job #:  [id number]

## 2011-02-09 ENCOUNTER — Encounter: Payer: Self-pay | Admitting: Family Medicine

## 2011-02-09 ENCOUNTER — Ambulatory Visit (INDEPENDENT_AMBULATORY_CARE_PROVIDER_SITE_OTHER): Payer: Medicare Other | Admitting: Family Medicine

## 2011-02-09 VITALS — BP 128/80 | HR 83 | Temp 98.7°F | Wt 217.0 lb

## 2011-02-09 DIAGNOSIS — K5289 Other specified noninfective gastroenteritis and colitis: Secondary | ICD-10-CM

## 2011-02-09 DIAGNOSIS — K529 Noninfective gastroenteritis and colitis, unspecified: Secondary | ICD-10-CM

## 2011-02-09 DIAGNOSIS — M542 Cervicalgia: Secondary | ICD-10-CM

## 2011-02-09 DIAGNOSIS — M1990 Unspecified osteoarthritis, unspecified site: Secondary | ICD-10-CM

## 2011-02-09 DIAGNOSIS — K219 Gastro-esophageal reflux disease without esophagitis: Secondary | ICD-10-CM

## 2011-02-09 DIAGNOSIS — Z23 Encounter for immunization: Secondary | ICD-10-CM

## 2011-02-09 MED ORDER — METAXALONE 800 MG PO TABS: 800.0000 mg | ORAL_TABLET | Freq: Four times a day (QID) | ORAL | Status: DC

## 2011-02-09 MED ORDER — SULINDAC 200 MG PO TABS: 200.0000 mg | ORAL_TABLET | Freq: Every day | ORAL | Status: DC

## 2011-02-09 NOTE — Progress Notes (Signed)
Addended by: Aniceto Boss A on: 02/09/2011 05:07 PM   Modules accepted: Orders

## 2011-02-09 NOTE — Progress Notes (Signed)
  Subjective:    Patient ID: Alex Weiss, male    DOB: February 19, 1941, 70 y.o.   MRN: [id number]  HPI Here for refills for his DJD and chronic neck pain. This still bothers him daily but he deals with it fairly well. His GERD and his colitis are well controlled.    Review of Systems  Constitutional: Negative.   Respiratory: Negative.   Cardiovascular: Negative.   Musculoskeletal: Positive for arthralgias.       Objective:   Physical Exam  Constitutional: He appears well-developed and well-nourished.  Neck: Normal range of motion. Neck supple. No thyromegaly present.  Cardiovascular: Normal rate, regular rhythm, normal heart sounds and intact distal pulses.   Pulmonary/Chest: Effort normal and breath sounds normal.  Lymphadenopathy:    He has no cervical adenopathy.          Assessment & Plan:  Refilled meds. He will set up a cpx soon

## 2011-02-21 ENCOUNTER — Ambulatory Visit (INDEPENDENT_AMBULATORY_CARE_PROVIDER_SITE_OTHER): Payer: Medicare Other | Admitting: Family Medicine

## 2011-02-21 ENCOUNTER — Encounter: Payer: Self-pay | Admitting: Family Medicine

## 2011-02-21 VITALS — BP 132/88 | HR 87 | Temp 98.0°F | Ht 73.0 in | Wt 218.0 lb

## 2011-02-21 DIAGNOSIS — Z136 Encounter for screening for cardiovascular disorders: Secondary | ICD-10-CM

## 2011-02-21 DIAGNOSIS — Z0000 Encounter for general adult medical examination without abnormal findings: Secondary | ICD-10-CM

## 2011-02-21 DIAGNOSIS — M1990 Unspecified osteoarthritis, unspecified site: Secondary | ICD-10-CM

## 2011-02-21 DIAGNOSIS — E785 Hyperlipidemia, unspecified: Secondary | ICD-10-CM

## 2011-02-21 DIAGNOSIS — N401 Enlarged prostate with lower urinary tract symptoms: Secondary | ICD-10-CM

## 2011-02-21 DIAGNOSIS — R635 Abnormal weight gain: Secondary | ICD-10-CM

## 2011-02-21 DIAGNOSIS — N138 Other obstructive and reflux uropathy: Secondary | ICD-10-CM

## 2011-02-21 LAB — CBC WITH DIFFERENTIAL/PLATELET
Basophils Absolute: 0 10*3/uL (ref 0.0–0.1)
Basophils Relative: 0.5 % (ref 0.0–3.0)
Eosinophils Absolute: 0.2 10*3/uL (ref 0.0–0.7)
Eosinophils Relative: 4.8 % (ref 0.0–5.0)
HCT: 49.5 % (ref 39.0–52.0)
Hemoglobin: 17 g/dL (ref 13.0–17.0)
Lymphocytes Relative: 20.4 % (ref 12.0–46.0)
Lymphs Abs: 0.9 10*3/uL (ref 0.7–4.0)
MCHC: 34.3 g/dL (ref 30.0–36.0)
MCV: 95.3 fl (ref 78.0–100.0)
Monocytes Absolute: 0.5 10*3/uL (ref 0.1–1.0)
Monocytes Relative: 11.1 % (ref 3.0–12.0)
Neutro Abs: 2.9 10*3/uL (ref 1.4–7.7)
Neutrophils Relative %: 63.2 % (ref 43.0–77.0)
Platelets: 146 10*3/uL — ABNORMAL LOW (ref 150.0–400.0)
RBC: 5.19 Mil/uL (ref 4.22–5.81)
RDW: 12.7 % (ref 11.5–14.6)
WBC: 4.5 10*3/uL (ref 4.5–10.5)

## 2011-02-21 LAB — POCT URINALYSIS DIPSTICK
Bilirubin, UA: NEGATIVE
Glucose, UA: NEGATIVE
Ketones, UA: NEGATIVE
Leukocytes, UA: NEGATIVE
Nitrite, UA: NEGATIVE
Protein, UA: NEGATIVE
Spec Grav, UA: 1.02
Urobilinogen, UA: 0.2
pH, UA: 6

## 2011-02-21 LAB — BASIC METABOLIC PANEL
BUN: 28 mg/dL — ABNORMAL HIGH (ref 6–23)
CO2: 30 mEq/L (ref 19–32)
Calcium: 9.7 mg/dL (ref 8.4–10.5)
Chloride: 103 mEq/L (ref 96–112)
Creatinine, Ser: 1 mg/dL (ref 0.4–1.5)
GFR: 82.18 mL/min (ref >60.00)
Glucose, Bld: 104 mg/dL — ABNORMAL HIGH (ref 70–99)
Potassium: 5.4 mEq/L — ABNORMAL HIGH (ref 3.5–5.1)
Sodium: 141 mEq/L (ref 135–145)

## 2011-02-21 LAB — PSA: PSA: 1.33 ng/mL (ref 0.10–4.00)

## 2011-02-21 LAB — LIPID PANEL
Cholesterol: 199 mg/dL (ref 0–200)
HDL: 49.1 mg/dL (ref >39.00)
LDL Cholesterol: 133 mg/dL — ABNORMAL HIGH (ref 0–99)
Total CHOL/HDL Ratio: 4
Triglycerides: 87 mg/dL (ref 0.0–149.0)
VLDL: 17.4 mg/dL (ref 0.0–40.0)

## 2011-02-21 LAB — HEPATIC FUNCTION PANEL
ALT: 26 U/L (ref 0–53)
AST: 24 U/L (ref 0–37)
Albumin: 4.4 g/dL (ref 3.5–5.2)
Alkaline Phosphatase: 70 U/L (ref 39–117)
Bilirubin, Direct: 0.2 mg/dL (ref 0.0–0.3)
Total Bilirubin: 1.1 mg/dL (ref 0.3–1.2)
Total Protein: 7.1 g/dL (ref 6.0–8.3)

## 2011-02-21 LAB — TSH: TSH: 0.95 u[IU]/mL (ref 0.35–5.50)

## 2011-02-21 NOTE — Progress Notes (Signed)
  Subjective:    Patient ID: Alex Weiss, male    DOB: 1940/07/18, 70 y.o.   MRN: [id number]  HPI 70 yr old male for a cpx. He feels well and has no concerns. He walks for exercise. No SOB or chest pains. BMs and urinations are normal.   Review of Systems  Constitutional: Negative.   HENT: Negative.   Eyes: Negative.   Respiratory: Negative.   Cardiovascular: Negative.   Gastrointestinal: Negative.   Genitourinary: Negative.   Musculoskeletal: Negative.   Skin: Negative.   Neurological: Negative.   Hematological: Negative.   Psychiatric/Behavioral: Negative.        Objective:   Physical Exam  Constitutional: He is oriented to person, place, and time. He appears well-developed and well-nourished. No distress.  HENT:  Head: Normocephalic and atraumatic.  Right Ear: External ear normal.  Left Ear: External ear normal.  Nose: Nose normal.  Mouth/Throat: Oropharynx is clear and moist. No oropharyngeal exudate.  Eyes: Conjunctivae and EOM are normal. Pupils are equal, round, and reactive to light. Right eye exhibits no discharge. Left eye exhibits no discharge. No scleral icterus.  Neck: Neck supple. No JVD present. No tracheal deviation present. No thyromegaly present.  Cardiovascular: Normal rate, regular rhythm, normal heart sounds and intact distal pulses.  Exam reveals no gallop and no friction rub.   No murmur heard.      EKG normal   Pulmonary/Chest: Effort normal and breath sounds normal. No respiratory distress. He has no wheezes. He has no rales. He exhibits no tenderness.  Abdominal: Soft. Bowel sounds are normal. He exhibits no distension and no mass. There is no tenderness. There is no rebound and no guarding.  Genitourinary: Rectum normal, prostate normal and penis normal. Guaiac negative stool. No penile tenderness.  Musculoskeletal: Normal range of motion. He exhibits no edema and no tenderness.  Lymphadenopathy:    He has no cervical adenopathy.  Neurological:  He is alert and oriented to person, place, and time. He has normal reflexes. No cranial nerve deficit. He exhibits normal muscle tone. Coordination normal.  Skin: Skin is warm and dry. No rash noted. He is not diaphoretic. No erythema. No pallor.  Psychiatric: He has a normal mood and affect. His behavior is normal. Judgment and thought content normal.          Assessment & Plan:  Well exam. Get fasting labs today

## 2011-02-23 NOTE — Progress Notes (Signed)
Quick Note:  Spoke with pt- informed lab results and dr. Claris Che instruction's for fat restrictions ______

## 2011-03-03 ENCOUNTER — Telehealth: Payer: Self-pay | Admitting: Family Medicine

## 2011-03-03 NOTE — Telephone Encounter (Signed)
Pt called and is req the lab results from cpx, be mailed to his home. Pls call pt and notify when this is done.

## 2011-03-04 NOTE — Telephone Encounter (Signed)
I put a copy of labs in mail today and pt is aware.

## 2011-05-25 ENCOUNTER — Ambulatory Visit (INDEPENDENT_AMBULATORY_CARE_PROVIDER_SITE_OTHER): Payer: Medicare Other | Admitting: Family Medicine

## 2011-05-25 ENCOUNTER — Encounter: Payer: Self-pay | Admitting: Family Medicine

## 2011-05-25 VITALS — BP 148/90 | HR 81 | Temp 98.4°F | Wt 224.0 lb

## 2011-05-25 DIAGNOSIS — J40 Bronchitis, not specified as acute or chronic: Secondary | ICD-10-CM | POA: Diagnosis not present

## 2011-05-25 MED ORDER — AZITHROMYCIN 250 MG PO TABS: ORAL_TABLET | ORAL | Status: AC

## 2011-05-25 NOTE — Progress Notes (Signed)
  Subjective:    Patient ID: Alex Weiss, male    DOB: 15-Feb-1941, 71 y.o.   MRN: [id number]  HPI Here with 5 days of stuffy head, PND, ST, chest congestion and a dry cough. On Delsym.   Review of Systems  Constitutional: Negative.   HENT: Positive for congestion, postnasal drip and sinus pressure.   Eyes: Negative.   Respiratory: Positive for cough.        Objective:   Physical Exam  Constitutional: He appears well-developed and well-nourished.  HENT:  Right Ear: External ear normal.  Left Ear: External ear normal.  Nose: Nose normal.  Mouth/Throat: Oropharynx is clear and moist. No oropharyngeal exudate.  Eyes: Conjunctivae are normal.  Pulmonary/Chest: Effort normal and breath sounds normal. No respiratory distress. He has no wheezes. He has no rales.  Lymphadenopathy:    He has no cervical adenopathy.          Assessment & Plan:  Add Mucinex prn

## 2011-07-18 DIAGNOSIS — H5200 Hypermetropia, unspecified eye: Secondary | ICD-10-CM | POA: Diagnosis not present

## 2011-07-18 DIAGNOSIS — H52229 Regular astigmatism, unspecified eye: Secondary | ICD-10-CM | POA: Diagnosis not present

## 2011-07-18 DIAGNOSIS — H2510 Age-related nuclear cataract, unspecified eye: Secondary | ICD-10-CM | POA: Diagnosis not present

## 2011-07-18 DIAGNOSIS — H524 Presbyopia: Secondary | ICD-10-CM | POA: Diagnosis not present

## 2011-08-02 DIAGNOSIS — L738 Other specified follicular disorders: Secondary | ICD-10-CM | POA: Diagnosis not present

## 2011-08-02 DIAGNOSIS — L821 Other seborrheic keratosis: Secondary | ICD-10-CM | POA: Diagnosis not present

## 2011-09-08 ENCOUNTER — Telehealth: Payer: Self-pay | Admitting: Family Medicine

## 2011-09-08 MED ORDER — SULINDAC 200 MG PO TABS: 200.0000 mg | ORAL_TABLET | Freq: Every day | ORAL | Status: DC

## 2011-09-08 NOTE — Telephone Encounter (Signed)
Pt requested a 90 day supply of Sulindac and send to [facility]. I sent script e[facility]scribe.

## 2011-10-28 ENCOUNTER — Encounter: Payer: Self-pay | Admitting: Family Medicine

## 2011-10-28 ENCOUNTER — Ambulatory Visit (INDEPENDENT_AMBULATORY_CARE_PROVIDER_SITE_OTHER): Payer: Medicare Other | Admitting: Family Medicine

## 2011-10-28 VITALS — BP 140/90 | HR 65 | Temp 97.9°F | Wt 215.0 lb

## 2011-10-28 DIAGNOSIS — R079 Chest pain, unspecified: Secondary | ICD-10-CM | POA: Diagnosis not present

## 2011-10-28 DIAGNOSIS — R350 Frequency of micturition: Secondary | ICD-10-CM | POA: Diagnosis not present

## 2011-10-28 LAB — POCT URINALYSIS DIPSTICK
Bilirubin, UA: NEGATIVE
Glucose, UA: NEGATIVE
Ketones, UA: NEGATIVE
Nitrite, UA: NEGATIVE
Protein, UA: NEGATIVE
Spec Grav, UA: 1.01
Urobilinogen, UA: 0.2
pH, UA: 7

## 2011-10-28 MED ORDER — CIPROFLOXACIN HCL 500 MG PO TABS: 500.0000 mg | ORAL_TABLET | Freq: Two times a day (BID) | ORAL | Status: AC

## 2011-10-28 NOTE — Progress Notes (Signed)
  Subjective:    Patient ID: Alex Weiss, male    DOB: March 15, 1941, 71 y.o.   MRN: [id number]  HPI Here for 2 things. First he has had 2 weeks of pressure to urinate and a very slow stream. No fever or pain. Also he mentions 2 months of occasional chest pains when he exerts himself, such as when he mows his grass. This is mild and there are no associated symptoms (no SOB or sweats or nausea). Then this discomfort resolves quickly when he rests. He feels fine today.    Review of Systems  Constitutional: Negative.   Respiratory: Negative.   Cardiovascular: Positive for chest pain. Negative for palpitations and leg swelling.  Genitourinary: Positive for urgency, frequency and difficulty urinating. Negative for dysuria, hematuria, flank pain and testicular pain.       Objective:   Physical Exam  Constitutional: He appears well-developed and well-nourished.  Cardiovascular: Normal rate, regular rhythm, normal heart sounds and intact distal pulses.        EKG normal   Pulmonary/Chest: Effort normal and breath sounds normal.  Abdominal: Soft. Bowel sounds are normal. He exhibits no distension and no mass. There is no tenderness. There is no rebound and no guarding.          Assessment & Plan:  Treat the UTI with Cipro and drinking plenty of water. His exertional chest pain is worrisome for angina, so we will set up a stress test soon.

## 2011-11-03 ENCOUNTER — Ambulatory Visit (HOSPITAL_COMMUNITY): Payer: Medicare Other | Attending: Internal Medicine | Admitting: Radiology

## 2011-11-03 VITALS — BP 160/87 | Ht 75.0 in | Wt 213.0 lb

## 2011-11-03 DIAGNOSIS — R079 Chest pain, unspecified: Secondary | ICD-10-CM

## 2011-11-03 MED ORDER — TECHNETIUM TC 99M TETROFOSMIN IV KIT
11.0000 | PACK | Freq: Once | INTRAVENOUS | Status: AC | PRN
  Administered 2011-11-03: 11 via INTRAVENOUS

## 2011-11-03 MED ORDER — TECHNETIUM TC 99M TETROFOSMIN IV KIT
33.0000 | PACK | Freq: Once | INTRAVENOUS | Status: AC | PRN
  Administered 2011-11-03: 33 via INTRAVENOUS

## 2011-11-03 NOTE — Progress Notes (Signed)
[facility] SITE 3 NUCLEAR MED [address] [place] [address] [phone]  Cardiology Nuclear Med Study  Alex Weiss is a 71 y.o. male     MRN : [id number]     DOB: 28-May-1940  Procedure Date: 11/03/2011  Nuclear Med Background Indication for Stress Test:  Evaluation for Ischemia History:  Possible MPS at [facility] 10-12 yrs ago Cardiac Risk Factors: Lipids  Symptoms:  Chest Pain   Nuclear Pre-Procedure Caffeine/Decaff Intake:  None > 12 hrs NPO After: 10:00pm   Lungs:  clear O2 Sat: 96% on room air. IV 0.9% NS with Angio Cath:  20g  IV Site: R Antecubital x 1, tolerated well IV Started by:  Irean Hong, RN  Chest Size (in):  44XL Cup Size: n/a  Height: 6\' 3"  (1.905 m)  Weight:  213 lb (96.616 kg)  BMI:  Body mass index is 26.62 kg/(m^2). Tech Comments:  n/a    Nuclear Med Study 1 or 2 day study: 1 day  Stress Test Type:  Stress  Reading MD: Dietrich Pates, MD  Order Authorizing Provider:  Gershon Crane, MD  Resting Radionuclide: Technetium 34m Tetrofosmin  Resting Radionuclide Dose: 11.0 mCi   Stress Radionuclide:  Technetium 40m Tetrofosmin  Stress Radionuclide Dose: 33.0 mCi           Stress Protocol Rest HR: 52 Stress HR: 131  Rest BP: 160/87 Stress BP: 206/85  Exercise Time (min): 10:15 METS: 12.10   Predicted Max HR: 149 bpm % Max HR: 87.92 bpm Rate Pressure Product: 34742   Dose of Adenosine (mg):  n/a Dose of Lexiscan: n/a mg  Dose of Atropine (mg): n/a Dose of Dobutamine: n/a mcg/kg/min (at max HR)  Stress Test Technologist: Milana Na, EMT-P  Nuclear Technologist:  Domenic Polite, CNMT     Rest Procedure:  Myocardial perfusion imaging was performed at rest 45 minutes following the intravenous administration of Technetium 61m Tetrofosmin. Rest ECG: NSR - Normal EKG  Stress Procedure:  The patient performed treadmill exercise using a Bruce  Protocol for 10:15 minutes. The patient stopped due to fatigue and chest tightness.   There were non specific ST-T wave changes.  The patient had 5/10 chest tightness with exercise. Technetium 28m Tetrofosmin was injected at peak exercise and myocardial perfusion imaging was performed after a brief delay. Stress ECG: Non Specific changes  QPS Raw Data Images:  Images were motion corrected.  SOft tissue (subcutaneous fat, diaphragm) surround heart. Stress Images:  Normal homogeneous uptake in all areas of the myocardium. Rest Images:  Normal homogeneous uptake in all areas of the myocardium. Subtraction (SDS):  No evidence of ischemia. Transient Ischemic Dilatation (Normal <1.22):  0.89 Lung/Heart Ratio (Normal <0.45):  0.29  Quantitative Gated Spect Images QGS EDV:  106 ml QGS ESV:  43 ml  Impression Exercise Capacity:  Excellent exercise capacity. BP Response:  Normal blood pressure response. Clinical Symptoms:  Mild chest pain/dyspnea. ECG Impression:  No significant ST segment change suggestive of ischemia. Comparison with Prior Nuclear Study: no report to compare  Overall Impression:  Normal stress nuclear study.  LV Ejection Fraction: 59%.  LV Wall Motion:  NL LV Function; NL Wall Motion  Dietrich Pates

## 2011-11-07 ENCOUNTER — Telehealth: Payer: Self-pay | Admitting: Family Medicine

## 2011-11-07 NOTE — Telephone Encounter (Signed)
I spoke with pt and gave results.  

## 2011-11-07 NOTE — Progress Notes (Signed)
Quick Note:  I spoke with pt ______ 

## 2011-11-07 NOTE — Telephone Encounter (Signed)
See report

## 2011-11-07 NOTE — Telephone Encounter (Signed)
Pt called req results for stress test and test done re: heart. Pls call.

## 2011-11-11 NOTE — Progress Notes (Signed)
Quick Note:    noted  ______

## 2012-01-20 DIAGNOSIS — Z23 Encounter for immunization: Secondary | ICD-10-CM | POA: Diagnosis not present

## 2012-01-24 DIAGNOSIS — L723 Sebaceous cyst: Secondary | ICD-10-CM | POA: Diagnosis not present

## 2012-01-24 DIAGNOSIS — L738 Other specified follicular disorders: Secondary | ICD-10-CM | POA: Diagnosis not present

## 2012-04-12 DIAGNOSIS — H6120 Impacted cerumen, unspecified ear: Secondary | ICD-10-CM | POA: Diagnosis not present

## 2012-04-12 DIAGNOSIS — J019 Acute sinusitis, unspecified: Secondary | ICD-10-CM | POA: Diagnosis not present

## 2012-04-12 DIAGNOSIS — J45909 Unspecified asthma, uncomplicated: Secondary | ICD-10-CM | POA: Diagnosis not present

## 2012-04-13 ENCOUNTER — Ambulatory Visit (INDEPENDENT_AMBULATORY_CARE_PROVIDER_SITE_OTHER): Payer: Medicare Other | Admitting: Family Medicine

## 2012-04-13 ENCOUNTER — Encounter: Payer: Self-pay | Admitting: Family Medicine

## 2012-04-13 VITALS — BP 132/90 | HR 75 | Temp 97.6°F | Wt 224.0 lb

## 2012-04-13 DIAGNOSIS — H6690 Otitis media, unspecified, unspecified ear: Secondary | ICD-10-CM

## 2012-04-13 DIAGNOSIS — J019 Acute sinusitis, unspecified: Secondary | ICD-10-CM

## 2012-04-13 MED ORDER — LEVOFLOXACIN 500 MG PO TABS: 500.0000 mg | ORAL_TABLET | Freq: Every day | ORAL | Status: AC

## 2012-04-13 NOTE — Progress Notes (Signed)
  Subjective:    Patient ID: Alex Weiss, male    DOB: [birth year], 72 y.o.   MRN: [id number]  HPI Here for 10 days of sinus pressure, PND, a dry cough, and now pain in the right ear. No fevers. He was seen in an Urgent Care yesterday in [facility] and they told him he had a sinus infection and a wax buildup in the ear. They flushed the ear out with water and put him on a Zpack with a Prednisone taper. Today he feels worse with more ear pain.    Review of Systems  Constitutional: Negative.   HENT: Positive for ear pain and congestion. Negative for hearing loss and tinnitus.   Eyes: Negative.   Respiratory: Positive for cough.        Objective:   Physical Exam  Constitutional: He appears well-developed and well-nourished.  HENT:  Left Ear: External ear normal.  Nose: Nose normal.  Mouth/Throat: Oropharynx is clear and moist. No oropharyngeal exudate.  Eyes: Conjunctivae normal are normal.  Neck: No thyromegaly present.       The right TM is red and bulging   Pulmonary/Chest: Effort normal and breath sounds normal.  Lymphadenopathy:    He has no cervical adenopathy.          Assessment & Plan:  Switch to Levaquin for 10 days. Finish out the steroid taper. Recheck prn

## 2012-04-24 ENCOUNTER — Encounter: Payer: Self-pay | Admitting: Family Medicine

## 2012-04-24 ENCOUNTER — Ambulatory Visit (INDEPENDENT_AMBULATORY_CARE_PROVIDER_SITE_OTHER): Payer: Medicare Other | Admitting: Family Medicine

## 2012-04-24 VITALS — BP 136/88 | HR 77 | Temp 97.9°F | Wt 222.0 lb

## 2012-04-24 DIAGNOSIS — H6690 Otitis media, unspecified, unspecified ear: Secondary | ICD-10-CM

## 2012-04-24 NOTE — Progress Notes (Signed)
  Subjective:    Patient ID: Alex Weiss, male    DOB: 26-Aug-1940, 72 y.o.   MRN: [id number]  HPI Here to recheck the right ear. He was here 10 days ago for an otitis media, with the TM looking red and bulging that day. He finished a course of Levaquin yesterday, and the pain has resolved. However his hearing is not yet back to normal.    Review of Systems  Constitutional: Negative.   HENT: Positive for hearing loss. Negative for ear pain, congestion and tinnitus.   Eyes: Negative.   Respiratory: Negative.        Objective:   Physical Exam  Constitutional: He appears well-developed and well-nourished.  HENT:  Left Ear: External ear normal.  Nose: Nose normal.  Mouth/Throat: Oropharynx is clear and moist.       Right TM is dull and has a small effusion behind it. No erythema   Eyes: Conjunctivae normal are normal. Pupils are equal, round, and reactive to light.  Lymphadenopathy:    He has no cervical adenopathy.          Assessment & Plan:  Partially resolved OM. The infection has been taken care of but he still has a small effusion. This will resolve over time. I suggested he try Mucinex D in the meantime.

## 2012-04-26 DIAGNOSIS — L0291 Cutaneous abscess, unspecified: Secondary | ICD-10-CM | POA: Diagnosis not present

## 2012-04-26 DIAGNOSIS — L738 Other specified follicular disorders: Secondary | ICD-10-CM | POA: Diagnosis not present

## 2012-04-26 DIAGNOSIS — L723 Sebaceous cyst: Secondary | ICD-10-CM | POA: Diagnosis not present

## 2012-04-26 DIAGNOSIS — L0390 Cellulitis, unspecified: Secondary | ICD-10-CM | POA: Diagnosis not present

## 2012-04-30 DIAGNOSIS — H9190 Unspecified hearing loss, unspecified ear: Secondary | ICD-10-CM | POA: Diagnosis not present

## 2012-04-30 DIAGNOSIS — H6980 Other specified disorders of Eustachian tube, unspecified ear: Secondary | ICD-10-CM | POA: Diagnosis not present

## 2012-04-30 DIAGNOSIS — H6500 Acute serous otitis media, unspecified ear: Secondary | ICD-10-CM | POA: Diagnosis not present

## 2012-05-02 DIAGNOSIS — H6980 Other specified disorders of Eustachian tube, unspecified ear: Secondary | ICD-10-CM | POA: Diagnosis not present

## 2012-05-02 DIAGNOSIS — H6500 Acute serous otitis media, unspecified ear: Secondary | ICD-10-CM | POA: Diagnosis not present

## 2012-05-28 DIAGNOSIS — J3089 Other allergic rhinitis: Secondary | ICD-10-CM | POA: Diagnosis not present

## 2012-05-28 DIAGNOSIS — H6980 Other specified disorders of Eustachian tube, unspecified ear: Secondary | ICD-10-CM | POA: Diagnosis not present

## 2012-06-25 ENCOUNTER — Other Ambulatory Visit: Payer: Self-pay | Admitting: Family Medicine

## 2012-07-13 ENCOUNTER — Ambulatory Visit (INDEPENDENT_AMBULATORY_CARE_PROVIDER_SITE_OTHER): Payer: Medicare Other | Admitting: Internal Medicine

## 2012-07-13 ENCOUNTER — Ambulatory Visit (INDEPENDENT_AMBULATORY_CARE_PROVIDER_SITE_OTHER)
Admission: RE | Admit: 2012-07-13 | Discharge: 2012-07-13 | Disposition: A | Discharge status: Home / Self Care | Payer: Medicare Other | Source: Ambulatory Visit | Attending: Internal Medicine | Admitting: Internal Medicine

## 2012-07-13 ENCOUNTER — Encounter: Payer: Self-pay | Admitting: Internal Medicine

## 2012-07-13 VITALS — BP 156/98 | Temp 100.6°F | Wt 218.0 lb

## 2012-07-13 DIAGNOSIS — R509 Fever, unspecified: Secondary | ICD-10-CM

## 2012-07-13 DIAGNOSIS — R5381 Other malaise: Secondary | ICD-10-CM | POA: Diagnosis not present

## 2012-07-13 DIAGNOSIS — R5383 Other fatigue: Secondary | ICD-10-CM | POA: Diagnosis not present

## 2012-07-13 LAB — BASIC METABOLIC PANEL
Chloride: 98 mEq/L (ref 96–112)
Creatinine, Ser: 0.9 mg/dL (ref 0.4–1.5)
Potassium: 3.7 mEq/L (ref 3.5–5.1)
Sodium: 132 mEq/L — ABNORMAL LOW (ref 135–145)

## 2012-07-13 LAB — CBC WITH DIFFERENTIAL/PLATELET
Basophils Relative: 0.4 % (ref 0.0–3.0)
Eosinophils Relative: 2.2 % (ref 0.0–5.0)
Hemoglobin: 14.4 g/dL (ref 13.0–17.0)
MCV: 92.5 fl (ref 78.0–100.0)
Monocytes Absolute: 0.7 10*3/uL (ref 0.1–1.0)
Neutro Abs: 6.9 10*3/uL (ref 1.4–7.7)
Neutrophils Relative %: 80.6 % — ABNORMAL HIGH (ref 43.0–77.0)
RBC: 4.53 Mil/uL (ref 4.22–5.81)
WBC: 8.5 10*3/uL (ref 4.5–10.5)

## 2012-07-13 LAB — POCT URINALYSIS DIPSTICK
Glucose, UA: NEGATIVE
Ketones, UA: NEGATIVE
Spec Grav, UA: 1.025

## 2012-07-13 MED ORDER — CEFUROXIME AXETIL 500 MG PO TABS: 500.0000 mg | ORAL_TABLET | Freq: Two times a day (BID) | ORAL | Status: AC

## 2012-07-13 NOTE — Progress Notes (Signed)
Subjective:    Patient ID: Alex Weiss, male    DOB: 08-27-1940, 72 y.o.   MRN: [id number]  HPI  72 year old white male with history of ulcerative colitis and BPH complains of not feeling well over last 1 week. Symptoms started approximately week ago on Monday. He started experiencing chills especially in the evening. This past weekend patient started feeling worse and stated that all day. He describes low-grade fever. He reports fevers somewhat higher in the afternoon. He denies any urinary symptoms. He has chronic issues with week stream and sense of incomplete bladder emptying. He denies dysuria. Patient also denies any abdominal complaints. No nausea vomiting constipation or diarrhea.  He had issues with ear infection in January. He required 2 rounds of antibiotics and he was later seen by ENT and had to undergo myringotomy (right ear).  He denies any recent procedures.  No travel history  Review of Systems Chills, low grade fever, negative for cough or sore throat    Past Medical History  Diagnosis Date  . Nephrolithiasis   . FH: colonic polyps   . Chicken pox   . Ulcerative colitis     sees Dr. Wendall Papa    History   Social History  . Marital Status: Married    Spouse Name: N/A    Number of Children: N/A  . Years of Education: N/A   Occupational History  . Not on file.   Social History Main Topics  . Smoking status: Never Smoker   . Smokeless tobacco: Never Used  . Alcohol Use: 2.5 oz/week    5 drink(s) per week  . Drug Use: No  . Sexually Active: Not on file   Other Topics Concern  . Not on file   Social History Narrative  . No narrative on file    Past Surgical History  Procedure Laterality Date  . Colonoscopy  04-15-09    per Dr. 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 Tendon repair right wrist    . C4-c5 fusion    . Tracheostomy    . Tonsillectomy      Family History  Problem Relation Age of Onset  . Arthritis Other    . Coronary artery disease Other   . Depression Other   . Hypertension Other   . Cancer Other     lung, colon  . Stroke Other   . Aortic dissection Brother   . Aortic stenosis Sister     Allergies  Allergen Reactions  . Ivp Dye (Iodinated Diagnostic Agents)     hives  . Penicillins     REACTION: hives    Current Outpatient Prescriptions on File Prior to Visit  Medication Sig Dispense Refill  . aspirin 81 MG tablet Take 81 mg by mouth daily.        . famotidine (PEPCID) 20 MG tablet Take 20 mg by mouth 2 (two) times daily.       . folic acid (FOLVITE) 1 MG tablet Take 1 mg by mouth daily.        . metaxalone (SKELAXIN) 800 MG tablet Take 400 mg by mouth daily as needed.       . Multiple Vitamin (MULTIVITAMIN) tablet Take 1 tablet by mouth daily.        . sulindac (CLINORIL) 200 MG tablet TAKE 1 TABLET BY MOUTH DAILY  90 tablet  1   No current facility-administered medications on file prior to visit.    BP  156/98  Temp(Src) 100.6 F (38.1 C) (Oral)  Wt 218 lb (98.884 kg)  BMI 27.25 kg/m2    Objective:   Physical Exam  Constitutional: He is oriented to person, place, and time. He appears well-developed and well-nourished.  HENT:  Head: Normocephalic and atraumatic.  Right Ear: External ear normal.  Mouth/Throat: Oropharynx is clear and moist.  Eyes: Conjunctivae and EOM are normal. Pupils are equal, round, and reactive to light.  No splinter hemorrhages  Neck: Neck supple.  No carotid bruit  Cardiovascular: Normal rate, regular rhythm and normal heart sounds.   No murmur heard. Pulmonary/Chest: Effort normal.  Slight coarse breath sounds left base  Abdominal: Soft. Bowel sounds are normal. He exhibits no mass. There is no tenderness.  Genitourinary: Rectum normal.  Enlarged prostate but no asymmetry or nodules, no prostate tenderness  Musculoskeletal: He exhibits no edema.  Lymphadenopathy:    He has no cervical adenopathy.  Neurological: He is alert and  oriented to person, place, and time. No cranial nerve deficit.  Skin: Skin is dry. No rash noted.  Psychiatric: He has a normal mood and affect. His behavior is normal.          Assessment & Plan:

## 2012-07-13 NOTE — Patient Instructions (Addendum)
Our office will contact you re: blood test and chest x ray results

## 2012-07-13 NOTE — Assessment & Plan Note (Addendum)
72 year old white male with fever of unknown origin. His chest x-ray is normal. His CBCD shows mild left shift. His UA is only notable microscopic hematuria. He does have history of BPH with incomplete bladder emptying. Treat empirically with cefuroxime 500 mg twice daily for 10 days. Followup in one week.  Patient advised to call office if symptoms persist or worsen.

## 2012-07-15 LAB — URINE CULTURE
Colony Count: NO GROWTH
Organism ID, Bacteria: NO GROWTH

## 2012-07-30 ENCOUNTER — Ambulatory Visit (INDEPENDENT_AMBULATORY_CARE_PROVIDER_SITE_OTHER): Payer: Medicare Other | Admitting: Family Medicine

## 2012-07-30 ENCOUNTER — Encounter: Payer: Self-pay | Admitting: Family Medicine

## 2012-07-30 VITALS — BP 140/80 | HR 74 | Temp 97.7°F | Wt 219.0 lb

## 2012-07-30 DIAGNOSIS — R319 Hematuria, unspecified: Secondary | ICD-10-CM | POA: Diagnosis not present

## 2012-07-30 LAB — POCT URINALYSIS DIPSTICK
Glucose, UA: NEGATIVE
Leukocytes, UA: NEGATIVE
Nitrite, UA: NEGATIVE
Urobilinogen, UA: 1

## 2012-07-30 NOTE — Progress Notes (Signed)
  Subjective:    Patient ID: Alex Weiss, male    DOB: 06-09-1940, 72 y.o.   MRN: [id number]  HPI Here to follow up after an apparent prostatitis. He was seen on 07-13-12 for chills and fevers to 100 degrees with no other obvious symptoms. His UA that day showed 1+ blood but no bacteria. His culture result was no growth. He was given 10 day of Ceftin and his symptoms went away. He has felt fine since then. Of note he had a workup for hematuria about 9 or 10 years ago that was unrevealing.    Review of Systems  Constitutional: Negative.   Respiratory: Negative.   Cardiovascular: Negative.   Gastrointestinal: Negative.   Genitourinary: Negative.         Objective:   Physical Exam  Constitutional: He appears well-developed and well-nourished.  Abdominal: Soft. Bowel sounds are normal. He exhibits no distension and no mass. There is no tenderness. There is no rebound and no guarding.          Assessment & Plan:  His prostatitis seems to have resolved. He will give another urine sample today to check for blood. If still present we will refer him back to Urology to evaluate. Of note he was a smoker for many years before he quit.

## 2012-07-31 NOTE — Addendum Note (Signed)
Addended by: Gershon Crane A on: 07/31/2012 01:08 PM   Modules accepted: Orders

## 2012-08-01 DIAGNOSIS — H2510 Age-related nuclear cataract, unspecified eye: Secondary | ICD-10-CM | POA: Diagnosis not present

## 2012-08-02 DIAGNOSIS — D2370 Other benign neoplasm of skin of unspecified lower limb, including hip: Secondary | ICD-10-CM | POA: Diagnosis not present

## 2012-08-02 DIAGNOSIS — L819 Disorder of pigmentation, unspecified: Secondary | ICD-10-CM | POA: Diagnosis not present

## 2012-08-02 DIAGNOSIS — D239 Other benign neoplasm of skin, unspecified: Secondary | ICD-10-CM | POA: Diagnosis not present

## 2012-08-02 DIAGNOSIS — L821 Other seborrheic keratosis: Secondary | ICD-10-CM | POA: Diagnosis not present

## 2012-08-02 DIAGNOSIS — L570 Actinic keratosis: Secondary | ICD-10-CM | POA: Diagnosis not present

## 2012-08-02 DIAGNOSIS — L738 Other specified follicular disorders: Secondary | ICD-10-CM | POA: Diagnosis not present

## 2012-08-02 DIAGNOSIS — D1801 Hemangioma of skin and subcutaneous tissue: Secondary | ICD-10-CM | POA: Diagnosis not present

## 2012-08-02 DIAGNOSIS — D2360 Other benign neoplasm of skin of unspecified upper limb, including shoulder: Secondary | ICD-10-CM | POA: Diagnosis not present

## 2012-08-16 DIAGNOSIS — R3129 Other microscopic hematuria: Secondary | ICD-10-CM | POA: Diagnosis not present

## 2012-08-28 DIAGNOSIS — N200 Calculus of kidney: Secondary | ICD-10-CM | POA: Diagnosis not present

## 2012-08-28 DIAGNOSIS — N400 Enlarged prostate without lower urinary tract symptoms: Secondary | ICD-10-CM | POA: Diagnosis not present

## 2012-08-28 DIAGNOSIS — R3129 Other microscopic hematuria: Secondary | ICD-10-CM | POA: Diagnosis not present

## 2012-08-29 DIAGNOSIS — R3129 Other microscopic hematuria: Secondary | ICD-10-CM | POA: Diagnosis not present

## 2012-08-31 ENCOUNTER — Other Ambulatory Visit: Payer: Self-pay | Admitting: Urology

## 2012-08-31 ENCOUNTER — Encounter (HOSPITAL_BASED_OUTPATIENT_CLINIC_OR_DEPARTMENT_OTHER): Payer: Self-pay | Admitting: *Deleted

## 2012-08-31 NOTE — Progress Notes (Signed)
NPO AFTER MN. ARRIVES AT 0830. NEEDS HG. WILL TAKE PEPCID AM OF SURG W/ SIP OF WATER.

## 2012-09-05 ENCOUNTER — Encounter (HOSPITAL_BASED_OUTPATIENT_CLINIC_OR_DEPARTMENT_OTHER): Payer: Self-pay | Admitting: Anesthesiology

## 2012-09-05 ENCOUNTER — Encounter (HOSPITAL_BASED_OUTPATIENT_CLINIC_OR_DEPARTMENT_OTHER)
Admission: RE | Disposition: A | Discharge status: Home / Self Care | Payer: Self-pay | Source: Ambulatory Visit | Attending: Urology

## 2012-09-05 ENCOUNTER — Ambulatory Visit (HOSPITAL_BASED_OUTPATIENT_CLINIC_OR_DEPARTMENT_OTHER): Payer: Medicare Other | Admitting: Anesthesiology

## 2012-09-05 ENCOUNTER — Ambulatory Visit (HOSPITAL_BASED_OUTPATIENT_CLINIC_OR_DEPARTMENT_OTHER)
Admission: RE | Admit: 2012-09-05 | Discharge: 2012-09-05 | Disposition: A | Discharge status: Home / Self Care | Payer: Medicare Other | Source: Ambulatory Visit | Attending: Urology | Admitting: Urology

## 2012-09-05 ENCOUNTER — Encounter (HOSPITAL_BASED_OUTPATIENT_CLINIC_OR_DEPARTMENT_OTHER): Payer: Self-pay | Admitting: *Deleted

## 2012-09-05 DIAGNOSIS — Z79899 Other long term (current) drug therapy: Secondary | ICD-10-CM | POA: Diagnosis not present

## 2012-09-05 DIAGNOSIS — Z7982 Long term (current) use of aspirin: Secondary | ICD-10-CM | POA: Diagnosis not present

## 2012-09-05 DIAGNOSIS — N362 Urethral caruncle: Secondary | ICD-10-CM | POA: Diagnosis not present

## 2012-09-05 DIAGNOSIS — K219 Gastro-esophageal reflux disease without esophagitis: Secondary | ICD-10-CM | POA: Diagnosis not present

## 2012-09-05 DIAGNOSIS — R3129 Other microscopic hematuria: Secondary | ICD-10-CM | POA: Diagnosis not present

## 2012-09-05 DIAGNOSIS — D4959 Neoplasm of unspecified behavior of other genitourinary organ: Secondary | ICD-10-CM

## 2012-09-05 HISTORY — DX: Personal history of other diseases of the digestive system: Z87.19

## 2012-09-05 HISTORY — PX: CYSTOSCOPY WITH BIOPSY: SHX5122

## 2012-09-05 HISTORY — DX: Gastro-esophageal reflux disease without esophagitis: K21.9

## 2012-09-05 HISTORY — DX: Presence of spectacles and contact lenses: Z97.3

## 2012-09-05 HISTORY — DX: Personal history of other diseases of male genital organs: Z87.438

## 2012-09-05 HISTORY — DX: Calculus of kidney: N20.0

## 2012-09-05 HISTORY — DX: Personal history of urinary calculi: Z87.442

## 2012-09-05 HISTORY — DX: Benign prostatic hyperplasia without lower urinary tract symptoms: N40.0

## 2012-09-05 HISTORY — DX: Other microscopic hematuria: R31.29

## 2012-09-05 HISTORY — DX: Unspecified osteoarthritis, unspecified site: M19.90

## 2012-09-05 LAB — POCT HEMOGLOBIN-HEMACUE: Hemoglobin: 16 g/dL (ref 13.0–17.0)

## 2012-09-05 SURGERY — CYSTOSCOPY, WITH BIOPSY
Anesthesia: General | Site: Bladder | Wound class: Clean Contaminated

## 2012-09-05 MED ORDER — LIDOCAINE HCL 2 % EX GEL
CUTANEOUS | Status: DC | PRN
  Administered 2012-09-05: 1

## 2012-09-05 MED ORDER — FENTANYL CITRATE 0.05 MG/ML IJ SOLN
INTRAMUSCULAR | Status: DC | PRN
  Administered 2012-09-05: 25 ug via INTRAVENOUS
  Administered 2012-09-05: 50 ug via INTRAVENOUS

## 2012-09-05 MED ORDER — STERILE WATER FOR IRRIGATION IR SOLN
Status: DC | PRN
  Administered 2012-09-05: 3000 mL

## 2012-09-05 MED ORDER — CIPROFLOXACIN IN D5W 400 MG/200ML IV SOLN
400.0000 mg | INTRAVENOUS | Status: AC
  Administered 2012-09-05: 400 mg via INTRAVENOUS
  Filled 2012-09-05: qty 200

## 2012-09-05 MED ORDER — HYDROCODONE-ACETAMINOPHEN 5-325 MG PO TABS: 1.0000 | ORAL_TABLET | ORAL | Status: DC | PRN

## 2012-09-05 MED ORDER — FENTANYL CITRATE 0.05 MG/ML IJ SOLN
25.0000 ug | INTRAMUSCULAR | Status: DC | PRN
  Filled 2012-09-05: qty 1

## 2012-09-05 MED ORDER — ONDANSETRON HCL 4 MG/2ML IJ SOLN
INTRAMUSCULAR | Status: DC | PRN
  Administered 2012-09-05: 4 mg via INTRAVENOUS

## 2012-09-05 MED ORDER — SENNOSIDES-DOCUSATE SODIUM 8.6-50 MG PO TABS: 1.0000 | ORAL_TABLET | Freq: Two times a day (BID) | ORAL | Status: DC

## 2012-09-05 MED ORDER — DEXAMETHASONE SODIUM PHOSPHATE 4 MG/ML IJ SOLN
INTRAMUSCULAR | Status: DC | PRN
  Administered 2012-09-05: 8 mg via INTRAVENOUS

## 2012-09-05 MED ORDER — PROMETHAZINE HCL 25 MG/ML IJ SOLN
6.2500 mg | INTRAMUSCULAR | Status: DC | PRN
  Filled 2012-09-05: qty 1

## 2012-09-05 MED ORDER — LACTATED RINGERS IV SOLN
INTRAVENOUS | Status: DC
  Filled 2012-09-05: qty 1000

## 2012-09-05 MED ORDER — LIDOCAINE HCL (CARDIAC) 20 MG/ML IV SOLN
INTRAVENOUS | Status: DC | PRN
  Administered 2012-09-05: 100 mg via INTRAVENOUS

## 2012-09-05 MED ORDER — LACTATED RINGERS IV SOLN
INTRAVENOUS | Status: DC
  Administered 2012-09-05 (×2): via INTRAVENOUS
  Filled 2012-09-05: qty 1000

## 2012-09-05 MED ORDER — PROPOFOL 10 MG/ML IV BOLUS
INTRAVENOUS | Status: DC | PRN
  Administered 2012-09-05: 300 mg via INTRAVENOUS

## 2012-09-05 MED ORDER — PHENAZOPYRIDINE HCL 100 MG PO TABS: 200.0000 mg | ORAL_TABLET | Freq: Three times a day (TID) | ORAL | Status: DC | PRN

## 2012-09-05 SURGICAL SUPPLY — 16 items
BAG DRAIN URO-CYSTO SKYTR STRL (DRAIN) ×2 IMPLANT
CANISTER SUCT LVC 12 LTR MEDI- (MISCELLANEOUS) IMPLANT
CATH FOLEY 2WAY SLVR  5CC 22FR (CATHETERS) ×1
CATH FOLEY 2WAY SLVR 5CC 22FR (CATHETERS) ×1 IMPLANT
CLOTH BEACON ORANGE TIMEOUT ST (SAFETY) ×2 IMPLANT
DRAPE CAMERA CLOSED 9X96 (DRAPES) ×2 IMPLANT
ELECT REM PT RETURN 9FT ADLT (ELECTROSURGICAL) ×2
ELECTRODE REM PT RTRN 9FT ADLT (ELECTROSURGICAL) ×1 IMPLANT
GLOVE BIO SURGEON STRL SZ7 (GLOVE) ×4 IMPLANT
GLOVE INDICATOR 7.5 STRL GRN (GLOVE) ×2 IMPLANT
GOWN STRL NON-REIN LRG LVL3 (GOWN DISPOSABLE) ×2 IMPLANT
GOWN STRL REIN XL XLG (GOWN DISPOSABLE) ×2 IMPLANT
NEEDLE HYPO 22GX1.5 SAFETY (NEEDLE) IMPLANT
NS IRRIG 500ML POUR BTL (IV SOLUTION) IMPLANT
PACK CYSTOSCOPY (CUSTOM PROCEDURE TRAY) ×2 IMPLANT
WATER STERILE IRR 3000ML UROMA (IV SOLUTION) ×2 IMPLANT

## 2012-09-05 NOTE — Op Note (Signed)
Urology Operative Report  Date of Procedure: 09/05/12  Surgeon: Natalia Leatherwood, MD Assistant:  None  Preoperative Diagnosis: Microscopic hematuria. Urethral lesion. Postoperative Diagnosis:  Same.   Procedure(s): Cystourethroscopy Prostatic urethral biopsy   Estimated blood loss: Minimal  Specimen: Prostatic urethral biopsy.  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 Negative bladder lesions.  History of present illness: 72 year old male presents today for prosthetic urethral lesion. This was discovered on cystoscopy for microscopic hematuria. He presents for biopsy today.   Procedure in detail: After informed consent was obtained, the patient was taken to the operating room. They were placed in the supine position. SCDs were turned on and in place. IV antibiotics were infused, and general anesthesia was induced. A timeout was performed in which the correct patient, surgical site, and procedure were identified and agreed upon by the team.  The patient was placed in a dorsolithotomy position, making sure to pad all pertinent neurovascular pressure points. The genitals were prepped and draped in the usual sterile fashion.  A rigid cystoscope was advanced through the urethra and into the bladder. The bladder was drained and then fully distended and evaluated in a systematic fashion with a 12 and 70 lens. The entire surface of the bladder was visualized and there were no bladder tumors noted. The scope was then withdrawn to the prostate urethra. There was very small frondular architecture to the mucosa of the prosthetic urethra closer to the verumontanum. A cold cup biopsy forcep was used to biopsy these sites. I made sure to stay proximal to the external urethral sphincter. I then gently cauterize the biopsy sites with the Bugbee electrode with the setting of coagulation at 20. Once again, I  was sure to maintain a proximal position to the external sphincter. I did note that the patient had a white ring of scar tissue in the bulbar urethra which was also present at the time of the cystoscopy. This is consistent with a stricture, but there was no significant narrowing.  I elected to place a Foley catheter while the patient woke up and in 10 to remove this in the recovery room help tamponade any bleeding from the prostate. I placed 10 cc of lidocaine jelly into the patient's urethra and then placed a 20 French Foley catheter with 10 cc of sterile water into the balloon.  This completed the procedure. He was placed back in a supine position, anesthesia was reversed, and he was taken to the PACU in a stable condition.  All counts were correct at the end of the case.

## 2012-09-05 NOTE — Anesthesia Preprocedure Evaluation (Signed)
Anesthesia Evaluation  Patient identified by MRN, date of birth, ID band Patient awake    Reviewed: Allergy & Precautions, H&P , NPO status , Patient's Chart, lab work & pertinent test results  Airway Mallampati: II TM Distance: >3 FB Neck ROM: Full    Dental  (+) Teeth Intact, Dental Advisory Given and Caps   Pulmonary neg pulmonary ROS, former smoker,  breath sounds clear to auscultation  Pulmonary exam normal       Cardiovascular negative cardio ROS  Rhythm:Regular Rate:Normal     Neuro/Psych negative neurological ROS  negative psych ROS   GI/Hepatic Neg liver ROS, PUD, GERD-  Medicated,  Endo/Other  negative endocrine ROS  Renal/GU Renal disease  negative genitourinary   Musculoskeletal negative musculoskeletal ROS (+)   Abdominal   Peds  Hematology negative hematology ROS (+)   Anesthesia Other Findings   Reproductive/Obstetrics                           Anesthesia Physical Anesthesia Plan  ASA: II  Anesthesia Plan: General   Post-op Pain Management:    Induction: Intravenous  Airway Management Planned: LMA  Additional Equipment:   Intra-op Plan:   Post-operative Plan: Extubation in OR  Informed Consent: I have reviewed the patients History and Physical, chart, labs and discussed the procedure including the risks, benefits and alternatives for the proposed anesthesia with the patient or authorized representative who has indicated his/her understanding and acceptance.   Dental advisory given  Plan Discussed with: CRNA  Anesthesia Plan Comments:         Anesthesia Quick Evaluation

## 2012-09-05 NOTE — H&P (Signed)
Urology History and Physical Exam  CC: Microscopic hematuria. Prostatic urethral lesion.  HPI:  72 year old male presents today for a prostate urethral lesion. This was associated with microscopic hematuria. It was discovered on office cystoscopy on 08/29/12. This is located in the prostatic urethra. It is papillary in nature. It was small, less than 1 cm in size. It was not actively bleeding. His CT of the abdomen and pelvis with and without IV contrast on 08/28/12. This was negative for any filling defects in his ureter or hydronephrosis or hydroureter bilaterally. We discussed management options of the area in his prostatic urethra. He is elected to proceed with cystoscopy, and prostatic urethral biopsy. We have discussed the risks, benefits, alternatives, and likelihood of achieving goals. UA 08/16/12 was negative for signs of infection.    PMH: Past Medical History  Diagnosis Date  . FH: colonic polyps   . History of nephrolithiasis     1962  . Hematuria, microscopic   . Arthritis   . GERD (gastroesophageal reflux disease)   . BPH (benign prostatic hypertrophy)   . Renal calculus, left     NON-OBSTRUCTING  . History of ulcerative colitis   . History of prostatitis   . Wears glasses     PSH: Past Surgical History  Procedure Laterality Date  . Colonoscopy  04-15-09    per Dr. 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 Tracheostomy  AGE 45 MONTHS OLD    STREP THROAT  . Cervical laminectomy  02-04-2004    C7 -- T2  . Tonsillectomy  AGE 7  . Anterior cervical decomp/discectomy fusion  1996    C3 --  C5  . Left quad tendon repair  2007  . Wrist ganglion excision Right 2003    Allergies: Allergies  Allergen Reactions  . Ivp Dye (Iodinated Diagnostic Agents) Hives  . Penicillins Hives    Medications: Prescriptions prior to admission  Medication Sig Dispense Refill  . aspirin 81 MG tablet Take 81 mg by mouth daily.        . famotidine (PEPCID) 20  MG tablet Take 20 mg by mouth 2 (two) times daily.       . folic acid (FOLVITE) 1 MG tablet Take 1 mg by mouth daily.        . Multiple Vitamin (MULTIVITAMIN) tablet Take 1 tablet by mouth daily.       . sulindac (CLINORIL) 200 MG tablet TAKE 1 TABLET BY MOUTH DAILY  90 tablet  1     Social History: History   Social History  . Marital Status: Married    Spouse Name: N/A    Number of Children: N/A  . Years of Education: N/A   Occupational History  . Not on file.   Social History Main Topics  . Smoking status: Former Smoker -- 2.00 packs/day for 17 years    Types: Cigarettes  . Smokeless tobacco: Never Used  . Alcohol Use: 12.6 oz/week    7 Glasses of wine, 14 Cans of beer per week     Comment: occ  . Drug Use: No  . Sexually Active: Not on file   Other Topics Concern  . Not on file   Social History Narrative  . No narrative on file    Family History: Family History  Problem Relation Age of Onset  . Arthritis Other   . Coronary artery disease Other   . Depression Other   . Hypertension Other   .  Cancer Other     lung, colon  . Stroke Other   . Aortic dissection Brother   . Aortic stenosis Sister     Review of Systems: Positive: None Negative: Fever, chest pain, or SOB.  A further 10 point review of systems was negative except what is listed in the HPI.  Physical Exam: Filed Vitals:   09/05/12 0813  BP: 186/91  Pulse: 62  Temp: 97.4 F (36.3 C)  Resp: 18    General: No acute distress.  Awake. Head:  Normocephalic.  Atraumatic. ENT:  EOMI.  Mucous membranes moist Neck:  Supple.  No lymphadenopathy. CV:  S1 present. S2 present. Regular rate. Pulmonary: Equal effort bilaterally.  Clear to auscultation bilaterally. Abdomen: Soft.  Non- tender to palpation. Skin:  Normal turgor.  No visible rash. Extremity: No gross deformity of bilateral upper extremities.  No gross deformity of    bilateral lower extremities. Neurologic: Alert. Appropriate  mood.    Studies:  No results found for this basename: HGB, WBC, PLT,  in the last 72 hours  No results found for this basename: NA, K, CL, CO2, BUN, CREATININE, CALCIUM, MAGNESIUM, GFRNONAA, GFRAA,  in the last 72 hours   No results found for this basename: PT, INR, APTT,  in the last 72 hours   No components found with this basename: ABG,     Assessment:  Microscopic hematuria. Prostatic urethral lesion.  Plan:  To OR for with cystoscopy, and prostatic urethral biopsy.

## 2012-09-05 NOTE — Transfer of Care (Signed)
Immediate Anesthesia Transfer of Care Note  Patient: Alex Weiss  Procedure(s) Performed: Procedure(s) (LRB): CYSTOSCOPY WITH BIOPSY (N/A)  Patient Location: PACU  Anesthesia Type: General  Level of Consciousness: awake, alert  and oriented  Airway & Oxygen Therapy: Patient Spontanous Breathing and Patient connected to face mask oxygen  Post-op Assessment: Report given to PACU RN and Post -op Vital signs reviewed and stable  Post vital signs: Reviewed and stable  Complications: No apparent anesthesia complications

## 2012-09-05 NOTE — Anesthesia Procedure Notes (Signed)
Procedure Name: LMA Insertion Date/Time: 09/05/2012 10:07 AM Performed by: Norva Pavlov Pre-anesthesia Checklist: Patient identified, Emergency Drugs available, Suction available and Patient being monitored Patient Re-evaluated:Patient Re-evaluated prior to inductionOxygen Delivery Method: Circle System Utilized Preoxygenation: Pre-oxygenation with 100% oxygen Intubation Type: IV induction Ventilation: Mask ventilation without difficulty LMA: LMA inserted LMA Size: 5.0 Number of attempts: 1 Airway Equipment and Method: bite block Placement Confirmation: positive ETCO2 Tube secured with: Tape Dental Injury: Teeth and Oropharynx as per pre-operative assessment

## 2012-09-05 NOTE — Anesthesia Postprocedure Evaluation (Signed)
Anesthesia Post Note  Patient: Alex Weiss  Procedure(s) Performed: Procedure(s) (LRB): CYSTOSCOPY WITH BIOPSY (N/A)  Anesthesia type: General  Patient location: PACU  Post pain: Pain level controlled  Post assessment: Post-op Vital signs reviewed  Last Vitals:  Filed Vitals:   09/05/12 1130  BP: 143/79  Pulse: 56  Temp:   Resp: 16    Post vital signs: Reviewed  Level of consciousness: sedated  Complications: No apparent anesthesia complications

## 2012-09-10 ENCOUNTER — Encounter (HOSPITAL_BASED_OUTPATIENT_CLINIC_OR_DEPARTMENT_OTHER): Payer: Self-pay | Admitting: Urology

## 2012-09-10 DIAGNOSIS — R3129 Other microscopic hematuria: Secondary | ICD-10-CM | POA: Diagnosis not present

## 2012-11-07 ENCOUNTER — Other Ambulatory Visit: Payer: Self-pay

## 2012-11-08 ENCOUNTER — Encounter: Payer: Self-pay | Admitting: Family Medicine

## 2012-11-16 ENCOUNTER — Ambulatory Visit (INDEPENDENT_AMBULATORY_CARE_PROVIDER_SITE_OTHER): Payer: Medicare Other | Admitting: Internal Medicine

## 2012-11-16 ENCOUNTER — Encounter: Payer: Self-pay | Admitting: Internal Medicine

## 2012-11-16 VITALS — BP 150/90 | HR 69 | Temp 98.1°F | Resp 20 | Wt 222.0 lb

## 2012-11-16 DIAGNOSIS — S2239XA Fracture of one rib, unspecified side, initial encounter for closed fracture: Secondary | ICD-10-CM | POA: Diagnosis not present

## 2012-11-16 DIAGNOSIS — S2231XA Fracture of one rib, right side, initial encounter for closed fracture: Secondary | ICD-10-CM

## 2012-11-16 MED ORDER — HYDROCODONE-ACETAMINOPHEN 7.5-300 MG PO TABS: 1.0000 | ORAL_TABLET | Freq: Four times a day (QID) | ORAL | Status: DC | PRN

## 2012-11-16 NOTE — Patient Instructions (Signed)
Call or return to clinic prn if these symptoms worsen or fail to improve as anticipated.   Take Aleve 200 mg twice daily for pain or swelling Rib Fracture Your caregiver has diagnosed you as having a rib fracture (a break). This can occur by a blow to the chest, by a fall against a hard object, or by violent coughing or sneezing. There may be one or many breaks. Rib fractures may heal on their own within 3 to 8 weeks. The longer healing period is usually associated with a continued cough or other aggravating activities. HOME CARE INSTRUCTIONS   Avoid strenuous activity. Be careful during activities and avoid bumping the injured rib. Activities that cause pain pull on the fracture site(s) and are best avoided if possible.  Eat a normal, well-balanced diet. Drink plenty of fluids to avoid constipation.  Take deep breaths several times a day to keep lungs free of infection. Try to cough several times a day, splinting the injured area with a pillow. This will help prevent pneumonia.  Do not wear a rib belt or binder. These restrict breathing which can lead to pneumonia.  Only take over-the-counter or prescription medicines for pain, discomfort, or fever as directed by your caregiver. SEEK MEDICAL CARE IF:  You develop a continual cough, associated with thick or bloody sputum. SEEK IMMEDIATE MEDICAL CARE IF:   You have a fever.  You have difficulty breathing.  You have nausea (feeling sick to your stomach), vomiting, or abdominal (belly) pain.  You have worsening pain, not controlled with medications. Document Released: 03/21/2005 Document Revised: 06/13/2011 Document Reviewed: 08/23/2006 [facility] Patient Information 2014 [place], [place].

## 2012-11-16 NOTE — Progress Notes (Signed)
Subjective:    Patient ID: Alex Weiss, male    DOB: Aug 29, 1940, 72 y.o.   MRN: [id number]  HPI  72 year old patient who was stable until 3 days ago. At that time he bent over a trash can to retrieve items and traumatized his right anterolateral chest wall. At that time he felt mild pain and a pop. Over the past 24-36 hours she has had increasing pain. Pain is quite severe and aggravated by deep inspiration and movement. No pulmonary symptoms. Last night he was forced to sleep in a recliner and was unable to comfortably sleep supine  Past Medical History  Diagnosis Date  . FH: colonic polyps   . History of nephrolithiasis     1962  . Hematuria, microscopic   . Arthritis   . GERD (gastroesophageal reflux disease)   . BPH (benign prostatic hypertrophy)   . Renal calculus, left     NON-OBSTRUCTING  . History of ulcerative colitis   . History of prostatitis   . Wears glasses     History   Social History  . Marital Status: Married    Spouse Name: N/A    Number of Children: N/A  . Years of Education: N/A   Occupational History  . Not on file.   Social History Main Topics  . Smoking status: Former Smoker -- 2.00 packs/day for 17 years    Types: Cigarettes  . Smokeless tobacco: Never Used  . Alcohol Use: 12.6 oz/week    7 Glasses of wine, 14 Cans of beer per week     Comment: occ  . Drug Use: No  . Sexual Activity: Not on file   Other Topics Concern  . Not on file   Social History Narrative  . No narrative on file    Past Surgical History  Procedure Laterality Date  . Colonoscopy  04-15-09    per Dr. 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 Tracheostomy  AGE 89 MONTHS OLD    STREP THROAT  . Cervical laminectomy  02-04-2004    C7 -- T2  . Tonsillectomy  AGE 27  . Anterior cervical decomp/discectomy fusion  1996    C3 --  C5  . Left quad tendon repair  2007  . Wrist ganglion excision Right 2003  . Cystoscopy with biopsy N/A 09/05/2012   Procedure: CYSTOSCOPY WITH BIOPSY;  Surgeon: Milford Cage, MD;  Location: [facility];  Service: Urology;  Laterality: N/A;    Family History  Problem Relation Age of Onset  . Arthritis Other   . Coronary artery disease Other   . Depression Other   . Hypertension Other   . Cancer Other     lung, colon  . Stroke Other   . Aortic dissection Brother   . Aortic stenosis Sister     Allergies  Allergen Reactions  . Ivp Dye [Iodinated Diagnostic Agents] Hives  . Penicillins Hives    Current Outpatient Prescriptions on File Prior to Visit  Medication Sig Dispense Refill  . famotidine (PEPCID) 20 MG tablet Take 20 mg by mouth 2 (two) times daily.       . folic acid (FOLVITE) 1 MG tablet Take 1 mg by mouth daily.        . sulindac (CLINORIL) 200 MG tablet TAKE 1 TABLET BY MOUTH DAILY  90 tablet  1   No current facility-administered medications on file prior to visit.    BP 150/90  Pulse  69  Temp(Src) 98.1 F (36.7 C) (Oral)  Resp 20  Wt 222 lb (100.699 kg)  BMI 27.75 kg/m2  SpO2 97%       Review of Systems  Constitutional: Negative for fever, chills, appetite change and fatigue.  HENT: Negative for hearing loss, ear pain, congestion, sore throat, trouble swallowing, neck stiffness, dental problem, voice change and tinnitus.   Eyes: Negative for pain, discharge and visual disturbance.  Respiratory: Negative for cough, chest tightness, wheezing and stridor.   Cardiovascular: Positive for chest pain. Negative for palpitations and leg swelling.  Gastrointestinal: Negative for nausea, vomiting, abdominal pain, diarrhea, constipation, blood in stool and abdominal distention.  Genitourinary: Negative for urgency, hematuria, flank pain, discharge, difficulty urinating and genital sores.  Musculoskeletal: Negative for myalgias, back pain, joint swelling, arthralgias and gait problem.  Skin: Negative for rash.  Neurological: Negative for dizziness, syncope,  speech difficulty, weakness, numbness and headaches.  Hematological: Negative for adenopathy. Does not bruise/bleed easily.  Psychiatric/Behavioral: Negative for behavioral problems and dysphoric mood. The patient is not nervous/anxious.        Objective:   Physical Exam  Constitutional: He is oriented to person, place, and time. He appears well-developed.  HENT:  Head: Normocephalic.  Right Ear: External ear normal.  Left Ear: External ear normal.  Eyes: Conjunctivae and EOM are normal.  Neck: Normal range of motion.  Cardiovascular: Normal rate and normal heart sounds.   Pulmonary/Chest: Breath sounds normal. He exhibits tenderness.  Point tenderness over the right lateral chest wall Symmetrical breath sounds No distress at rest  Abdominal: Bowel sounds are normal.  Musculoskeletal: Normal range of motion. He exhibits no edema and no tenderness.  Neurological: He is alert and oriented to person, place, and time.  Psychiatric: He has a normal mood and affect. His behavior is normal.          Assessment & Plan:   Probable fracture rib. We'll moderate his activities and treat with analgesics. Will call if unimproved or if he develops any shortness of breath Patient information concerning fracture to the ribs dispensed

## 2012-12-08 ENCOUNTER — Other Ambulatory Visit: Payer: Self-pay | Admitting: Family Medicine

## 2013-02-07 ENCOUNTER — Other Ambulatory Visit: Payer: Self-pay

## 2013-02-18 ENCOUNTER — Encounter: Payer: Self-pay | Admitting: Family Medicine

## 2013-02-18 ENCOUNTER — Ambulatory Visit (INDEPENDENT_AMBULATORY_CARE_PROVIDER_SITE_OTHER): Payer: Medicare Other | Admitting: Family Medicine

## 2013-02-18 VITALS — BP 134/80 | HR 73 | Temp 98.2°F | Wt 220.0 lb

## 2013-02-18 DIAGNOSIS — J209 Acute bronchitis, unspecified: Secondary | ICD-10-CM | POA: Diagnosis not present

## 2013-02-18 MED ORDER — TRIAMCINOLONE ACETONIDE 0.1 % EX CREA: 1.0000 "application " | TOPICAL_CREAM | Freq: Two times a day (BID) | CUTANEOUS | Status: DC

## 2013-02-18 MED ORDER — AZITHROMYCIN 250 MG PO TABS: ORAL_TABLET | ORAL | Status: DC

## 2013-02-18 NOTE — Progress Notes (Signed)
Pre visit review using our clinic review tool, if applicable. No additional management support is needed unless otherwise documented below in the visit note. 

## 2013-02-18 NOTE — Progress Notes (Signed)
  Subjective:    Patient ID: Alex Weiss, male    DOB: 1940/09/04, 72 y.o.   MRN: [id number]  HPI Here for 10 days of chest tightness and coughing up yellow sputum. No fever. On Tylenol Sinus and Robitussin DM.    Review of Systems  Constitutional: Negative.   HENT: Positive for congestion. Negative for sinus pressure.   Eyes: Negative.   Respiratory: Positive for cough and chest tightness. Negative for shortness of breath and wheezing.        Objective:   Physical Exam  Constitutional: He appears well-developed and well-nourished.  HENT:  Right Ear: External ear normal.  Left Ear: External ear normal.  Nose: Nose normal.  Mouth/Throat: Oropharynx is clear and moist.  Eyes: Conjunctivae are normal.  Pulmonary/Chest: Effort normal. No respiratory distress. He has no rales.  Soft wheezes and rhonchi   Lymphadenopathy:    He has no cervical adenopathy.          Assessment & Plan:  Drink fluids.

## 2013-03-04 DIAGNOSIS — N281 Cyst of kidney, acquired: Secondary | ICD-10-CM | POA: Diagnosis not present

## 2013-03-04 DIAGNOSIS — N400 Enlarged prostate without lower urinary tract symptoms: Secondary | ICD-10-CM | POA: Diagnosis not present

## 2013-03-04 DIAGNOSIS — N200 Calculus of kidney: Secondary | ICD-10-CM | POA: Diagnosis not present

## 2013-03-04 DIAGNOSIS — R3915 Urgency of urination: Secondary | ICD-10-CM | POA: Diagnosis not present

## 2013-05-01 ENCOUNTER — Encounter: Payer: Self-pay | Admitting: Family Medicine

## 2013-05-01 ENCOUNTER — Ambulatory Visit (INDEPENDENT_AMBULATORY_CARE_PROVIDER_SITE_OTHER): Payer: Medicare Other | Admitting: Family Medicine

## 2013-05-01 VITALS — BP 154/86 | HR 66 | Temp 97.9°F | Ht 75.0 in | Wt 222.0 lb

## 2013-05-01 DIAGNOSIS — Z9109 Other allergy status, other than to drugs and biological substances: Secondary | ICD-10-CM

## 2013-05-01 NOTE — Progress Notes (Signed)
Pre visit review using our clinic review tool, if applicable. No additional management support is needed unless otherwise documented below in the visit note. 

## 2013-05-01 NOTE — Progress Notes (Signed)
   Subjective:    Patient ID: Alex Weiss, male    DOB: July 18, 1940, 73 y.o.   MRN: [id number]  HPI Here to check his ears. He has frequent sinus congestion and he uses Mucinex D and Nasomex daily. [place] and he wants to make sure he does not have an ear infection. He has no pain or fever.    Review of Systems  Constitutional: Negative.   HENT: Positive for congestion and sinus pressure. Negative for ear discharge, ear pain, postnasal drip and rhinorrhea.   Eyes: Negative.   Respiratory: Negative.        Objective:   Physical Exam  Constitutional: He appears well-developed and well-nourished.  HENT:  Right Ear: External ear normal.  Left Ear: External ear normal.  Nose: Nose normal.  Mouth/Throat: Oropharynx is clear and moist.  Eyes: Conjunctivae are normal.  Lymphadenopathy:    He has no cervical adenopathy.          Assessment & Plan:  No signs of infection. Suggested he add an antihistamine to his daily regimen.

## 2013-05-03 ENCOUNTER — Other Ambulatory Visit: Payer: Self-pay | Admitting: Family Medicine

## 2013-05-27 ENCOUNTER — Ambulatory Visit (INDEPENDENT_AMBULATORY_CARE_PROVIDER_SITE_OTHER): Payer: Medicare Other | Admitting: Family Medicine

## 2013-05-27 ENCOUNTER — Encounter: Payer: Self-pay | Admitting: Family Medicine

## 2013-05-27 VITALS — BP 152/76 | HR 86 | Temp 98.4°F | Ht 75.0 in | Wt 222.0 lb

## 2013-05-27 DIAGNOSIS — J069 Acute upper respiratory infection, unspecified: Secondary | ICD-10-CM

## 2013-05-27 MED ORDER — OSELTAMIVIR PHOSPHATE 75 MG PO CAPS: 75.0000 mg | ORAL_CAPSULE | Freq: Two times a day (BID) | ORAL | Status: DC

## 2013-05-27 NOTE — Progress Notes (Signed)
Pre visit review using our clinic review tool, if applicable. No additional management support is needed unless otherwise documented below in the visit note. 

## 2013-05-27 NOTE — Progress Notes (Signed)
   Subjective:    Patient ID: Alex Weiss, male    DOB: 1940-10-18, 73 y.o.   MRN: [id number]  HPI Here with his wife for 5 days of stuffy head, PND, ST, aches, and a dry cough. His wife has had a fever but he has not.    Review of Systems  Constitutional: Negative.   HENT: Positive for congestion and postnasal drip. Negative for sinus pressure.   Eyes: Negative.   Respiratory: Positive for cough.        Objective:   Physical Exam  Constitutional: He appears well-developed and well-nourished.  HENT:  Right Ear: External ear normal.  Left Ear: External ear normal.  Nose: Nose normal.  Mouth/Throat: Oropharynx is clear and moist.  Eyes: Conjunctivae are normal.  Pulmonary/Chest: Effort normal and breath sounds normal.  Lymphadenopathy:    He has no cervical adenopathy.          Assessment & Plan:  Cover with Tamiflu. Recheck prn

## 2013-06-20 DIAGNOSIS — J069 Acute upper respiratory infection, unspecified: Secondary | ICD-10-CM | POA: Diagnosis not present

## 2013-07-09 ENCOUNTER — Encounter: Payer: Self-pay | Admitting: Family Medicine

## 2013-07-14 ENCOUNTER — Other Ambulatory Visit: Payer: Self-pay | Admitting: Family Medicine

## 2013-07-16 ENCOUNTER — Telehealth: Payer: Self-pay | Admitting: Family Medicine

## 2013-07-16 NOTE — Telephone Encounter (Signed)
Would like to know if I can use SDA slot for a patient with a sinus infection and tomorrow is his only day off

## 2013-07-16 NOTE — Telephone Encounter (Signed)
Okay to schedule

## 2013-07-17 ENCOUNTER — Ambulatory Visit (INDEPENDENT_AMBULATORY_CARE_PROVIDER_SITE_OTHER): Payer: Medicare Other | Admitting: Family Medicine

## 2013-07-17 ENCOUNTER — Encounter: Payer: Self-pay | Admitting: Family Medicine

## 2013-07-17 VITALS — BP 160/76 | HR 78 | Temp 98.0°F | Ht 75.0 in | Wt 222.0 lb

## 2013-07-17 DIAGNOSIS — J019 Acute sinusitis, unspecified: Secondary | ICD-10-CM

## 2013-07-17 MED ORDER — LEVOFLOXACIN 500 MG PO TABS: 500.0000 mg | ORAL_TABLET | Freq: Every day | ORAL | Status: AC

## 2013-07-17 NOTE — Progress Notes (Signed)
   Subjective:    Patient ID: Alex Weiss, male    DOB: 04-26-1940, 73 y.o.   MRN: [id number]  HPI Here for one week of sinus pressure, HA, PND, and ST. No cough. He was treated in March with a Zpack for a sinus infection. He seemed to get better for awhile but is having the same sx again.   Review of Systems  Constitutional: Negative.   HENT: Positive for congestion, postnasal drip and sinus pressure.   Eyes: Negative.   Respiratory: Negative.        Objective:   Physical Exam  Constitutional: He appears well-developed and well-nourished.  HENT:  Right Ear: External ear normal.  Left Ear: External ear normal.  Nose: Nose normal.  Mouth/Throat: Oropharynx is clear and moist.  Eyes: Conjunctivae are normal.  Pulmonary/Chest: Effort normal and breath sounds normal.  Lymphadenopathy:    He has no cervical adenopathy.          Assessment & Plan:  Treat with Levaquin.

## 2013-07-17 NOTE — Progress Notes (Signed)
Pre visit review using our clinic review tool, if applicable. No additional management support is needed unless otherwise documented below in the visit note. 

## 2013-07-18 NOTE — Telephone Encounter (Signed)
Pt has been scheduled.  °

## 2013-08-05 DIAGNOSIS — H18519 Endothelial corneal dystrophy, unspecified eye: Secondary | ICD-10-CM | POA: Diagnosis not present

## 2013-08-05 DIAGNOSIS — H5200 Hypermetropia, unspecified eye: Secondary | ICD-10-CM | POA: Diagnosis not present

## 2013-08-05 DIAGNOSIS — H2510 Age-related nuclear cataract, unspecified eye: Secondary | ICD-10-CM | POA: Diagnosis not present

## 2013-08-06 DIAGNOSIS — D1801 Hemangioma of skin and subcutaneous tissue: Secondary | ICD-10-CM | POA: Diagnosis not present

## 2013-08-06 DIAGNOSIS — D2370 Other benign neoplasm of skin of unspecified lower limb, including hip: Secondary | ICD-10-CM | POA: Diagnosis not present

## 2013-08-06 DIAGNOSIS — D239 Other benign neoplasm of skin, unspecified: Secondary | ICD-10-CM | POA: Diagnosis not present

## 2013-08-06 DIAGNOSIS — D2360 Other benign neoplasm of skin of unspecified upper limb, including shoulder: Secondary | ICD-10-CM | POA: Diagnosis not present

## 2013-08-06 DIAGNOSIS — L909 Atrophic disorder of skin, unspecified: Secondary | ICD-10-CM | POA: Diagnosis not present

## 2013-08-06 DIAGNOSIS — L821 Other seborrheic keratosis: Secondary | ICD-10-CM | POA: Diagnosis not present

## 2013-08-06 DIAGNOSIS — L819 Disorder of pigmentation, unspecified: Secondary | ICD-10-CM | POA: Diagnosis not present

## 2013-08-06 DIAGNOSIS — L570 Actinic keratosis: Secondary | ICD-10-CM | POA: Diagnosis not present

## 2013-08-06 DIAGNOSIS — L919 Hypertrophic disorder of the skin, unspecified: Secondary | ICD-10-CM | POA: Diagnosis not present

## 2013-08-15 ENCOUNTER — Ambulatory Visit (INDEPENDENT_AMBULATORY_CARE_PROVIDER_SITE_OTHER): Payer: Medicare Other | Admitting: Family Medicine

## 2013-08-15 ENCOUNTER — Encounter: Payer: Self-pay | Admitting: Family Medicine

## 2013-08-15 VITALS — BP 149/98 | HR 68 | Temp 98.4°F | Ht 75.0 in | Wt 213.0 lb

## 2013-08-15 DIAGNOSIS — Z23 Encounter for immunization: Secondary | ICD-10-CM | POA: Diagnosis not present

## 2013-08-15 DIAGNOSIS — R030 Elevated blood-pressure reading, without diagnosis of hypertension: Secondary | ICD-10-CM

## 2013-08-15 DIAGNOSIS — N401 Enlarged prostate with lower urinary tract symptoms: Secondary | ICD-10-CM | POA: Diagnosis not present

## 2013-08-15 DIAGNOSIS — N138 Other obstructive and reflux uropathy: Secondary | ICD-10-CM

## 2013-08-15 DIAGNOSIS — I10 Essential (primary) hypertension: Secondary | ICD-10-CM

## 2013-08-15 DIAGNOSIS — M1990 Unspecified osteoarthritis, unspecified site: Secondary | ICD-10-CM

## 2013-08-15 DIAGNOSIS — Z87442 Personal history of urinary calculi: Secondary | ICD-10-CM

## 2013-08-15 DIAGNOSIS — Z860100 Personal history of colon polyps, unspecified: Secondary | ICD-10-CM

## 2013-08-15 DIAGNOSIS — IMO0001 Reserved for inherently not codable concepts without codable children: Secondary | ICD-10-CM

## 2013-08-15 LAB — CBC WITH DIFFERENTIAL/PLATELET
BASOS PCT: 0.5 % (ref 0.0–3.0)
Basophils Absolute: 0 10*3/uL (ref 0.0–0.1)
EOS ABS: 0.2 10*3/uL (ref 0.0–0.7)
EOS PCT: 5.4 % — AB (ref 0.0–5.0)
HEMATOCRIT: 48.6 % (ref 39.0–52.0)
HEMOGLOBIN: 16.6 g/dL (ref 13.0–17.0)
Lymphocytes Relative: 25.6 % (ref 12.0–46.0)
Lymphs Abs: 1 10*3/uL (ref 0.7–4.0)
MCHC: 34.2 g/dL (ref 30.0–36.0)
MCV: 94.9 fl (ref 78.0–100.0)
MONO ABS: 0.4 10*3/uL (ref 0.1–1.0)
Monocytes Relative: 10.5 % (ref 3.0–12.0)
NEUTROS ABS: 2.3 10*3/uL (ref 1.4–7.7)
NEUTROS PCT: 58 % (ref 43.0–77.0)
Platelets: 141 10*3/uL — ABNORMAL LOW (ref 150.0–400.0)
RBC: 5.12 Mil/uL (ref 4.22–5.81)
RDW: 12.6 % (ref 11.5–15.5)
WBC: 4 10*3/uL (ref 4.0–10.5)

## 2013-08-15 LAB — HEPATIC FUNCTION PANEL
ALBUMIN: 4.4 g/dL (ref 3.5–5.2)
ALK PHOS: 64 U/L (ref 39–117)
ALT: 25 U/L (ref 0–53)
AST: 27 U/L (ref 0–37)
BILIRUBIN DIRECT: 0.3 mg/dL (ref 0.0–0.3)
Total Bilirubin: 1.5 mg/dL — ABNORMAL HIGH (ref 0.2–1.2)
Total Protein: 6.7 g/dL (ref 6.0–8.3)

## 2013-08-15 LAB — POCT URINALYSIS DIPSTICK
BILIRUBIN UA: NEGATIVE
GLUCOSE UA: NEGATIVE
KETONES UA: NEGATIVE
Leukocytes, UA: NEGATIVE
NITRITE UA: NEGATIVE
PH UA: 6
Protein, UA: NEGATIVE
SPEC GRAV UA: 1.02
Urobilinogen, UA: 0.2

## 2013-08-15 LAB — BASIC METABOLIC PANEL
BUN: 26 mg/dL — AB (ref 6–23)
CHLORIDE: 103 meq/L (ref 96–112)
CO2: 29 mEq/L (ref 19–32)
Calcium: 9.5 mg/dL (ref 8.4–10.5)
Creatinine, Ser: 0.8 mg/dL (ref 0.4–1.5)
GFR: 95.2 mL/min (ref >60.00)
Glucose, Bld: 81 mg/dL (ref 70–99)
POTASSIUM: 4.6 meq/L (ref 3.5–5.1)
SODIUM: 138 meq/L (ref 135–145)

## 2013-08-15 LAB — LIPID PANEL
CHOLESTEROL: 178 mg/dL (ref 0–200)
HDL: 53.1 mg/dL (ref >39.00)
LDL CALC: 117 mg/dL — AB (ref 0–99)
Total CHOL/HDL Ratio: 3
Triglycerides: 41 mg/dL (ref 0.0–149.0)
VLDL: 8.2 mg/dL (ref 0.0–40.0)

## 2013-08-15 LAB — PSA: PSA: 1.28 ng/mL (ref 0.10–4.00)

## 2013-08-15 LAB — TSH: TSH: 0.74 u[IU]/mL (ref 0.35–4.50)

## 2013-08-15 NOTE — Addendum Note (Signed)
Addended by: Aggie Hacker A on: 08/15/2013 12:02 PM   Modules accepted: Orders

## 2013-08-15 NOTE — Progress Notes (Signed)
Pre visit review using our clinic review tool, if applicable. No additional management support is needed unless otherwise documented below in the visit note. 

## 2013-08-15 NOTE — Progress Notes (Signed)
   Subjective:    Patient ID: Alex Weiss, male    DOB: 08/15/1940, 73 y.o.   MRN: [id number]  HPI 73 yr old male for a cpx. He feels well. He saw Dr. Jasmine December last December for a urologic evaluation since he had some hematuria. This showed only a few kidney stones and the hematuria was felt to be benign. He was told to follow up with them in one year. I noted that he has had elevated BPs every visit to our clinic for the past year or so. He does not check this at home, but his wife has a cuff they can use.    Review of Systems  Constitutional: Negative.   HENT: Negative.   Eyes: Negative.   Respiratory: Negative.   Cardiovascular: Negative.   Gastrointestinal: Negative.   Genitourinary: Negative.   Musculoskeletal: Negative.   Skin: Negative.   Neurological: Negative.   Psychiatric/Behavioral: Negative.        Objective:   Physical Exam  Constitutional: He is oriented to person, place, and time. He appears well-developed and well-nourished. No distress.  HENT:  Head: Normocephalic and atraumatic.  Right Ear: External ear normal.  Left Ear: External ear normal.  Nose: Nose normal.  Mouth/Throat: Oropharynx is clear and moist. No oropharyngeal exudate.  Eyes: Conjunctivae and EOM are normal. Pupils are equal, round, and reactive to light. Right eye exhibits no discharge. Left eye exhibits no discharge. No scleral icterus.  Neck: Neck supple. No JVD present. No tracheal deviation present. No thyromegaly present.  Cardiovascular: Normal rate, regular rhythm, normal heart sounds and intact distal pulses.  Exam reveals no gallop and no friction rub.   No murmur heard. EKG normal  Pulmonary/Chest: Effort normal and breath sounds normal. No respiratory distress. He has no wheezes. He has no rales. He exhibits no tenderness.  Abdominal: Soft. Bowel sounds are normal. He exhibits no distension and no mass. There is no tenderness. There is no rebound and no guarding.  Musculoskeletal:  Normal range of motion. He exhibits no edema and no tenderness.  Lymphadenopathy:    He has no cervical adenopathy.  Neurological: He is alert and oriented to person, place, and time. He has normal reflexes. No cranial nerve deficit. He exhibits normal muscle tone. Coordination normal.  Skin: Skin is warm and dry. No rash noted. He is not diaphoretic. No erythema. No pallor.  Psychiatric: He has a normal mood and affect. His behavior is normal. Judgment and thought content normal.          Assessment & Plan:  Well exam. Get fasting labs. He will check his BP at home every day for the next 2 weeks, and he will report the readings back to [place].

## 2013-08-15 NOTE — Addendum Note (Signed)
Addended by: Alysia Penna A on: 08/15/2013 11:13 AM   Modules accepted: Orders

## 2013-08-15 NOTE — Addendum Note (Signed)
Addended by: Aggie Hacker A on: 08/15/2013 12:05 PM   Modules accepted: Orders

## 2013-08-16 ENCOUNTER — Telehealth: Payer: Self-pay | Admitting: Family Medicine

## 2013-08-16 NOTE — Telephone Encounter (Signed)
Relevant patient education assigned to patient using Emmi. ° °

## 2013-09-25 ENCOUNTER — Other Ambulatory Visit: Payer: Self-pay | Admitting: Family Medicine

## 2013-10-31 ENCOUNTER — Telehealth: Payer: Self-pay | Admitting: Family Medicine

## 2013-10-31 NOTE — Telephone Encounter (Signed)
I left a voice message, Dr. [name] did review all recorded blood pressure readings and he noted that pt's readings are fine and no medication is needed. I sent paperwork to be scanned in computer and left the above message for pt.

## 2013-12-06 ENCOUNTER — Other Ambulatory Visit: Payer: Self-pay | Admitting: Family Medicine

## 2014-01-15 DIAGNOSIS — Z23 Encounter for immunization: Secondary | ICD-10-CM | POA: Diagnosis not present

## 2014-02-19 ENCOUNTER — Encounter: Payer: Self-pay | Admitting: Family Medicine

## 2014-02-19 ENCOUNTER — Ambulatory Visit (INDEPENDENT_AMBULATORY_CARE_PROVIDER_SITE_OTHER): Payer: Medicare Other | Admitting: Family Medicine

## 2014-02-19 VITALS — BP 147/79 | HR 63 | Temp 98.2°F | Ht 75.0 in | Wt 200.0 lb

## 2014-02-19 DIAGNOSIS — H8110 Benign paroxysmal vertigo, unspecified ear: Secondary | ICD-10-CM

## 2014-02-19 MED ORDER — MECLIZINE HCL 25 MG PO TABS: 25.0000 mg | ORAL_TABLET | ORAL | Status: DC | PRN

## 2014-02-19 NOTE — Progress Notes (Signed)
   Subjective:    Patient ID: Alex Weiss, male    DOB: Jun 17, 1940, 73 y.o.   MRN: [id number]  HPI Here for 3-4 weeks of intermittent vertigo with mild dizziness with moving the head around. No HA or sinus congestion. He takes Alavert daily.    Review of Systems  Constitutional: Negative.   HENT: Negative.   Eyes: Negative.   Respiratory: Negative.   Neurological: Positive for dizziness. Negative for tremors, seizures, syncope, facial asymmetry, speech difficulty, weakness, light-headedness, numbness and headaches.       Objective:   Physical Exam  Constitutional: He is oriented to person, place, and time. He appears well-developed and well-nourished.  HENT:  Right Ear: External ear normal.  Left Ear: External ear normal.  Nose: Nose normal.  Mouth/Throat: Oropharynx is clear and moist.  Eyes: Conjunctivae are normal. Pupils are equal, round, and reactive to light.  Neck: Neck supple. No thyromegaly present.  Lymphadenopathy:    He has no cervical adenopathy.  Neurological: He is alert and oriented to person, place, and time. No cranial nerve deficit. Coordination normal.          Assessment & Plan:  Continue the daily antihistamine. Add Mucinex 1200 mg bid. Use Meclizine prn. Recheck if not better in 2 weeks

## 2014-02-19 NOTE — Progress Notes (Signed)
Pre visit review using our clinic review tool, if applicable. No additional management support is needed unless otherwise documented below in the visit note. 

## 2014-03-06 DIAGNOSIS — R312 Other microscopic hematuria: Secondary | ICD-10-CM | POA: Diagnosis not present

## 2014-03-06 DIAGNOSIS — N200 Calculus of kidney: Secondary | ICD-10-CM | POA: Diagnosis not present

## 2014-03-06 DIAGNOSIS — N281 Cyst of kidney, acquired: Secondary | ICD-10-CM | POA: Diagnosis not present

## 2014-03-06 DIAGNOSIS — R3915 Urgency of urination: Secondary | ICD-10-CM | POA: Diagnosis not present

## 2014-03-17 DIAGNOSIS — R896 Abnormal cytological findings in specimens from other organs, systems and tissues: Secondary | ICD-10-CM | POA: Diagnosis not present

## 2014-03-17 DIAGNOSIS — N200 Calculus of kidney: Secondary | ICD-10-CM | POA: Diagnosis not present

## 2014-03-17 DIAGNOSIS — R312 Other microscopic hematuria: Secondary | ICD-10-CM | POA: Diagnosis not present

## 2014-03-17 DIAGNOSIS — N400 Enlarged prostate without lower urinary tract symptoms: Secondary | ICD-10-CM | POA: Diagnosis not present

## 2014-03-17 DIAGNOSIS — N401 Enlarged prostate with lower urinary tract symptoms: Secondary | ICD-10-CM | POA: Diagnosis not present

## 2014-03-25 ENCOUNTER — Telehealth: Payer: Self-pay | Admitting: Family Medicine

## 2014-03-25 NOTE — Telephone Encounter (Signed)
Pt saw dr Jeffie Pollock and had abnormal findings on CT SCAN . Pt states dr Jeffie Pollock sent a copy to dr fry to review. Pt said they saw something in lungs

## 2014-03-25 NOTE — Telephone Encounter (Signed)
I did receive a faxed report of the CT showing opacities in the right lower lobe. We will plan on getting a repeat non-contrasted chest CT in 3 months, so tell Lyle to call us back in early March 2016 to set this up

## 2014-03-26 NOTE — Telephone Encounter (Signed)
I spoke with pt  

## 2014-05-19 ENCOUNTER — Emergency Department (HOSPITAL_BASED_OUTPATIENT_CLINIC_OR_DEPARTMENT_OTHER)
Admission: EM | Admit: 2014-05-19 | Discharge: 2014-05-19 | Disposition: A | Discharge status: Home / Self Care | Payer: Medicare Other | Attending: Emergency Medicine | Admitting: Emergency Medicine

## 2014-05-19 ENCOUNTER — Telehealth: Payer: Self-pay | Admitting: Family Medicine

## 2014-05-19 ENCOUNTER — Encounter (HOSPITAL_BASED_OUTPATIENT_CLINIC_OR_DEPARTMENT_OTHER): Payer: Self-pay | Admitting: *Deleted

## 2014-05-19 ENCOUNTER — Emergency Department (HOSPITAL_BASED_OUTPATIENT_CLINIC_OR_DEPARTMENT_OTHER): Payer: Medicare Other

## 2014-05-19 DIAGNOSIS — R002 Palpitations: Secondary | ICD-10-CM | POA: Diagnosis not present

## 2014-05-19 DIAGNOSIS — R55 Syncope and collapse: Secondary | ICD-10-CM | POA: Diagnosis not present

## 2014-05-19 DIAGNOSIS — R079 Chest pain, unspecified: Secondary | ICD-10-CM | POA: Diagnosis present

## 2014-05-19 DIAGNOSIS — Z880 Allergy status to penicillin: Secondary | ICD-10-CM | POA: Insufficient documentation

## 2014-05-19 DIAGNOSIS — R0600 Dyspnea, unspecified: Secondary | ICD-10-CM | POA: Diagnosis not present

## 2014-05-19 DIAGNOSIS — Z87891 Personal history of nicotine dependence: Secondary | ICD-10-CM | POA: Insufficient documentation

## 2014-05-19 LAB — BASIC METABOLIC PANEL
ANION GAP: 2 — AB (ref 5–15)
BUN: 21 mg/dL (ref 6–23)
CALCIUM: 9 mg/dL (ref 8.4–10.5)
CHLORIDE: 106 mmol/L (ref 96–112)
CO2: 30 mmol/L (ref 19–32)
Creatinine, Ser: 0.76 mg/dL (ref 0.50–1.35)
GFR, EST NON AFRICAN AMERICAN: 88 mL/min — AB (ref >90)
Glucose, Bld: 106 mg/dL — ABNORMAL HIGH (ref 70–99)
Potassium: 4.7 mmol/L (ref 3.5–5.1)
Sodium: 138 mmol/L (ref 135–145)

## 2014-05-19 LAB — CBC
HEMATOCRIT: 46.8 % (ref 39.0–52.0)
Hemoglobin: 16.4 g/dL (ref 13.0–17.0)
MCH: 31.9 pg (ref 26.0–34.0)
MCHC: 35 g/dL (ref 30.0–36.0)
MCV: 91.1 fL (ref 78.0–100.0)
PLATELETS: 132 10*3/uL — AB (ref 150–400)
RBC: 5.14 MIL/uL (ref 4.22–5.81)
RDW: 12.8 % (ref 11.5–15.5)
WBC: 4.4 10*3/uL (ref 4.0–10.5)

## 2014-05-19 LAB — TROPONIN I
Troponin I: <0.03 ng/mL (ref <0.031)
Troponin I: <0.03 ng/mL (ref <0.031)

## 2014-05-19 NOTE — ED Notes (Signed)
Fluttering in his chest for 45 minutes. New onset. States last week he had a similar episode that lasted a minute. He feels sob today.

## 2014-05-19 NOTE — ED Notes (Signed)
Pt resting. No complaints at this time. Will continue to monitor.

## 2014-05-19 NOTE — Discharge Instructions (Signed)

## 2014-05-19 NOTE — Telephone Encounter (Signed)
FYI

## 2014-05-19 NOTE — ED Provider Notes (Signed)
Care assumed from Dr. Colin Rhein at shift change. Patient awaiting a second troponin which has returned negative. He will be discharged to home with when necessary return.  Veryl Speak, MD 05/19/14 [phone]

## 2014-05-19 NOTE — ED Notes (Signed)
2nd troponin drawn

## 2014-05-19 NOTE — ED Provider Notes (Signed)
CSN: [id number]     Arrival date & time 05/19/14  1243 History   First MD Initiated Contact with Patient 05/19/14 1251     Chief Complaint  Patient presents with  . Chest Pain     (Consider location/radiation/quality/duration/timing/severity/associated sxs/prior Treatment) Patient is a 74 y.o. male presenting with palpitations.  Palpitations Palpitations quality:  Irregular Onset quality:  Sudden Duration:  15 minutes Timing:  Constant Progression:  Resolved Chronicity:  Recurrent Context: not anxiety and not hyperventilation   Context comment:  Standing Relieved by:  Nothing Worsened by:  Nothing Ineffective treatments:  None tried Associated symptoms: near-syncope   Associated symptoms: no back pain, no chest pain, no chest pressure, no cough and no shortness of breath     History reviewed. No pertinent past medical history. Past Surgical History  Procedure Laterality Date  . Back surgery     No family history on file. History  Substance Use Topics  . Smoking status: Former Research scientist (life sciences)  . Smokeless tobacco: Not on file  . Alcohol Use: Yes    Review of Systems  Respiratory: Negative for cough and shortness of breath.   Cardiovascular: Positive for palpitations and near-syncope. Negative for chest pain.  Musculoskeletal: Negative for back pain.  All other systems reviewed and are negative.     Allergies  Ivp dye and Penicillins  Home Medications   Prior to Admission medications   Not on File   BP 135/75 mmHg  Pulse 54  Temp(Src) 97.8 F (36.6 C) (Oral)  Resp 17  Ht 6\' 3"  (1.905 m)  Wt 195 lb (88.451 kg)  BMI 24.37 kg/m2  SpO2 92% Physical Exam  Constitutional: He is oriented to person, place, and time. He appears well-developed and well-nourished.  HENT:  Head: Normocephalic and atraumatic.  Eyes: Conjunctivae and EOM are normal.  Neck: Normal range of motion. Neck supple.  Cardiovascular: Normal rate, regular rhythm and normal heart sounds.    Pulmonary/Chest: Effort normal and breath sounds normal. No respiratory distress.  Abdominal: He exhibits no distension. There is no tenderness. There is no rebound and no guarding.  Musculoskeletal: Normal range of motion.  Neurological: He is alert and oriented to person, place, and time.  Skin: Skin is warm and dry.  Vitals reviewed.   ED Course  Procedures (including critical care time) Labs Review Labs Reviewed  BASIC METABOLIC PANEL - Abnormal; Notable for the following:    Glucose, Bld 106 (*)    GFR calc non Af Amer 88 (*)    Anion gap 2 (*)    All other components within normal limits  CBC - Abnormal; Notable for the following:    Platelets 132 (*)    All other components within normal limits  TROPONIN I  TROPONIN I    Imaging Review Dg Chest Port 1 View  05/19/2014   CLINICAL DATA:  Difficulty breathing with fluttering sensation in chest  EXAM: PORTABLE CHEST - 1 VIEW  COMPARISON:  CT abdomen including lung bases March 17, 2014  FINDINGS: Currently lungs are clear. The ground-glass type opacity seen in the right base on recent chest CT is not appreciable on radiographic examination. Heart size and pulmonary vascularity are normal. No adenopathy. No bone lesions.  IMPRESSION: No edema or consolidation. The ground-glass type opacity seen in the right base on recent chest CT is not appreciable by radiography. As CT is more sensitive for such opacities than is radiography, the followup chest CT recommendation made in the report of the  prior CT examination remains in effect.   Electronically Signed   By: Lowella Grip III M.D.   On: 05/19/2014 13:23     EKG Interpretation   Date/Time:  Monday May 19 2014 12:46:08 EST Ventricular Rate:  59 PR Interval:  174 QRS Duration: 96 QT Interval:  374 QTC Calculation: 370 R Axis:   12 Text Interpretation:  Sinus bradycardia Otherwise normal ECG No old  tracing to compare Confirmed by Debby Freiberg [phone]) on  05/19/2014  12:51:53 PM      MDM   Final diagnoses:  Heart palpitations    74 y.o. male without pertinent PMH presents with palpitations and near syncope as above.  Physical exam on arrival benign, no symptoms.  HEART score 2 (isolated for age).  Initial wu unremarkable.  Plan for delta trop.  Pt care to Dr. Stark Jock pending delta.  I have reviewed all laboratory and imaging studies if ordered as above  1. Heart palpitations         Debby Freiberg, MD 05/20/14 [phone]

## 2014-05-19 NOTE — ED Notes (Signed)
Pt resting quietly.  Updated on plan of care and drawing 2nd troponin at 1600.  Family and patient verbalize understanding. No complaints at this time.  Will continue to monitor.

## 2014-05-19 NOTE — Telephone Encounter (Signed)
PLEASE NOTE: All timestamps contained within this report are represented as [place] Standard Time. CONFIDENTIALTY NOTICE: This fax transmission is intended only for the addressee. It contains information that is legally privileged, confidential or otherwise protected from use or disclosure. If you are not the intended recipient, you are strictly prohibited from reviewing, disclosing, copying using or disseminating any of this information or taking any action in reliance on or regarding this information. If you have received this fax in error, please notify us immediately by telephone so that we can arrange for its return to [place]. Phone: [phone], Toll-Free: [phone], Fax: [phone] Page: 1 of 1 Call Id: [id number] [facility] - Client [place] Patient Name: Alex Weiss DOB: Jan 19, 1941 Initial Comment Caller states [name], pt of [name], Dec 19, 1940; having heart flutters, lightheaded and dizzy; Nurse Assessment Nurse: Loletta Specter, RN, [name] Date/Time (Eastern Time): 05/19/2014 12:16:08 PM Confirm and document reason for call. If symptomatic, describe symptoms. ---Caller states having heart flutters, lightheaded and dizzy; for 15-20 seconds. Pt took aspirin, still feels dizzy Has the patient traveled out of the country within the last 30 days? ---Not Applicable Does the patient require triage? ---Yes Related visit to physician within the last 2 weeks? ---No Does the PT have any chronic conditions? (i.e. diabetes, asthma, etc.) ---No Guidelines Guideline Title Affirmed Question Affirmed Notes Heart Rate and Heartbeat Questions Dizziness, lightheadedness, or weakness Final Disposition User Go to ED Now Loletta Specter, RN, [name]

## 2014-05-19 NOTE — Telephone Encounter (Signed)
Noted  

## 2014-05-23 ENCOUNTER — Encounter: Payer: Self-pay | Admitting: Gastroenterology

## 2014-05-28 ENCOUNTER — Encounter: Payer: Self-pay | Admitting: Family Medicine

## 2014-05-28 ENCOUNTER — Ambulatory Visit (INDEPENDENT_AMBULATORY_CARE_PROVIDER_SITE_OTHER): Payer: Medicare Other | Admitting: Family Medicine

## 2014-05-28 VITALS — BP 132/72 | HR 61 | Temp 98.0°F | Ht 75.0 in | Wt 201.0 lb

## 2014-05-28 DIAGNOSIS — R002 Palpitations: Secondary | ICD-10-CM

## 2014-05-28 DIAGNOSIS — R9389 Abnormal findings on diagnostic imaging of other specified body structures: Secondary | ICD-10-CM

## 2014-05-28 DIAGNOSIS — R938 Abnormal findings on diagnostic imaging of other specified body structures: Secondary | ICD-10-CM | POA: Diagnosis not present

## 2014-05-28 NOTE — Progress Notes (Signed)
   Subjective:    Patient ID: Alex Weiss, male    DOB: 28-Dec-1940, 74 y.o.   MRN: [id number]  HPI Here to follow up on a visit to the ED on 05-19-14 for a bout of chest palpitations that lasted about 20 seconds that caused some lightheadedness. No chest pain or SOB. He was at home looking at a photo album. At the ED his workup was totally normal, including labs and an EKG showing his usual sinus bradycardia. Since then he has felt fine with only an occasional flutter that lasts only a second or two. He still walks 2-3 miles every day with no difficulty. He did have a normal nuclear stress test in August 2013.    Review of Systems  Constitutional: Negative.   Respiratory: Negative.   Cardiovascular: Positive for palpitations. Negative for chest pain and leg swelling.  Neurological: Negative.        Objective:   Physical Exam  Constitutional: He is oriented to person, place, and time. He appears well-developed and well-nourished. No distress.  Cardiovascular: Normal rate, regular rhythm, normal heart sounds and intact distal pulses.   Pulmonary/Chest: Effort normal and breath sounds normal.  Neurological: He is alert and oriented to person, place, and time.          Assessment & Plan:  His palpitations seem to be benign but we will refer him to Cardiology further evaluation. Advised him to avoid excessive caffeine use.

## 2014-05-28 NOTE — Progress Notes (Signed)
Pre visit review using our clinic review tool, if applicable. No additional management support is needed unless otherwise documented below in the visit note. 

## 2014-05-30 ENCOUNTER — Encounter: Payer: Self-pay | Admitting: Family Medicine

## 2014-06-02 ENCOUNTER — Ambulatory Visit (INDEPENDENT_AMBULATORY_CARE_PROVIDER_SITE_OTHER)
Admission: RE | Admit: 2014-06-02 | Discharge: 2014-06-02 | Disposition: A | Discharge status: Home / Self Care | Payer: Medicare Other | Source: Ambulatory Visit | Attending: Family Medicine | Admitting: Family Medicine

## 2014-06-02 DIAGNOSIS — R938 Abnormal findings on diagnostic imaging of other specified body structures: Secondary | ICD-10-CM

## 2014-06-02 DIAGNOSIS — R918 Other nonspecific abnormal finding of lung field: Secondary | ICD-10-CM | POA: Diagnosis not present

## 2014-06-02 DIAGNOSIS — R9389 Abnormal findings on diagnostic imaging of other specified body structures: Secondary | ICD-10-CM

## 2014-06-02 DIAGNOSIS — I2510 Atherosclerotic heart disease of native coronary artery without angina pectoris: Secondary | ICD-10-CM | POA: Diagnosis not present

## 2014-06-02 IMAGING — CR DG CHEST 2V
2 series · 2 of 2 positions shown · non-contrast
Comparison: CT chest 11/17/2004.

CLINICAL DATA: Fatigue, low grade fever.

CHEST - 2 VIEW

[view not recorded (1 of 2)]
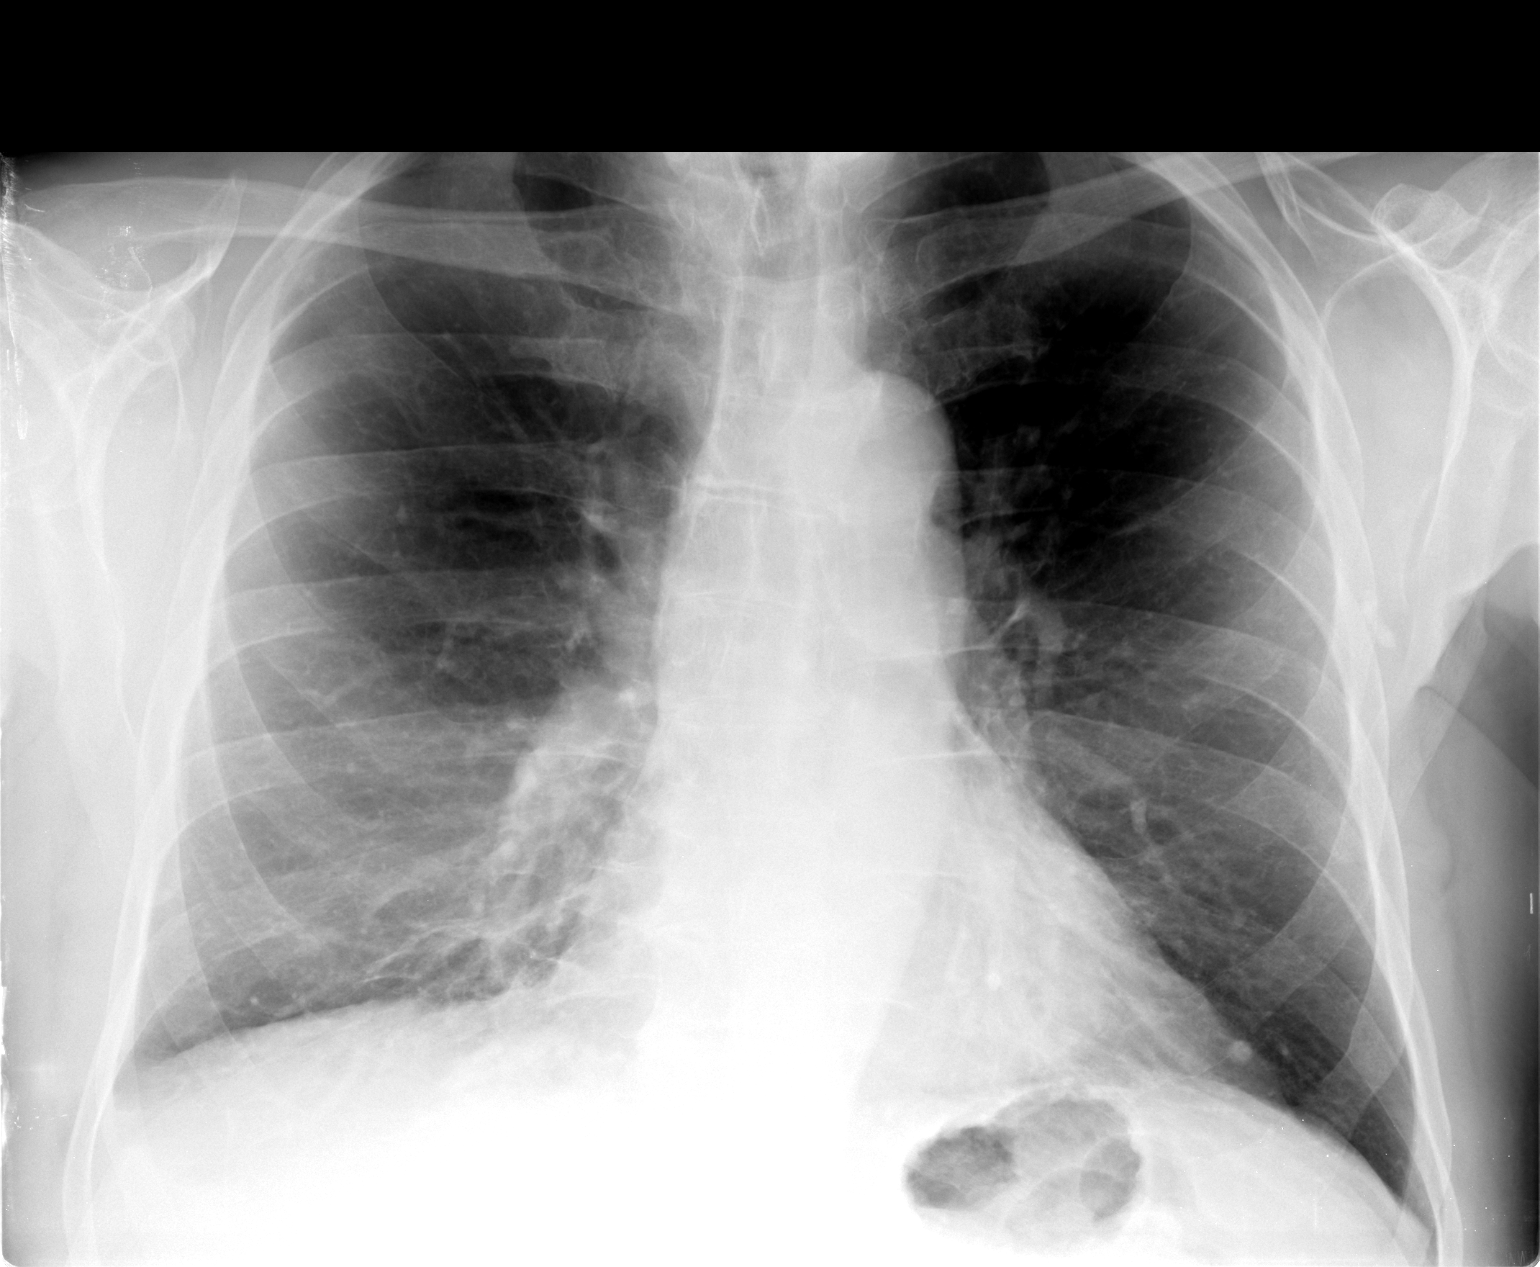

[view not recorded (2 of 2)]
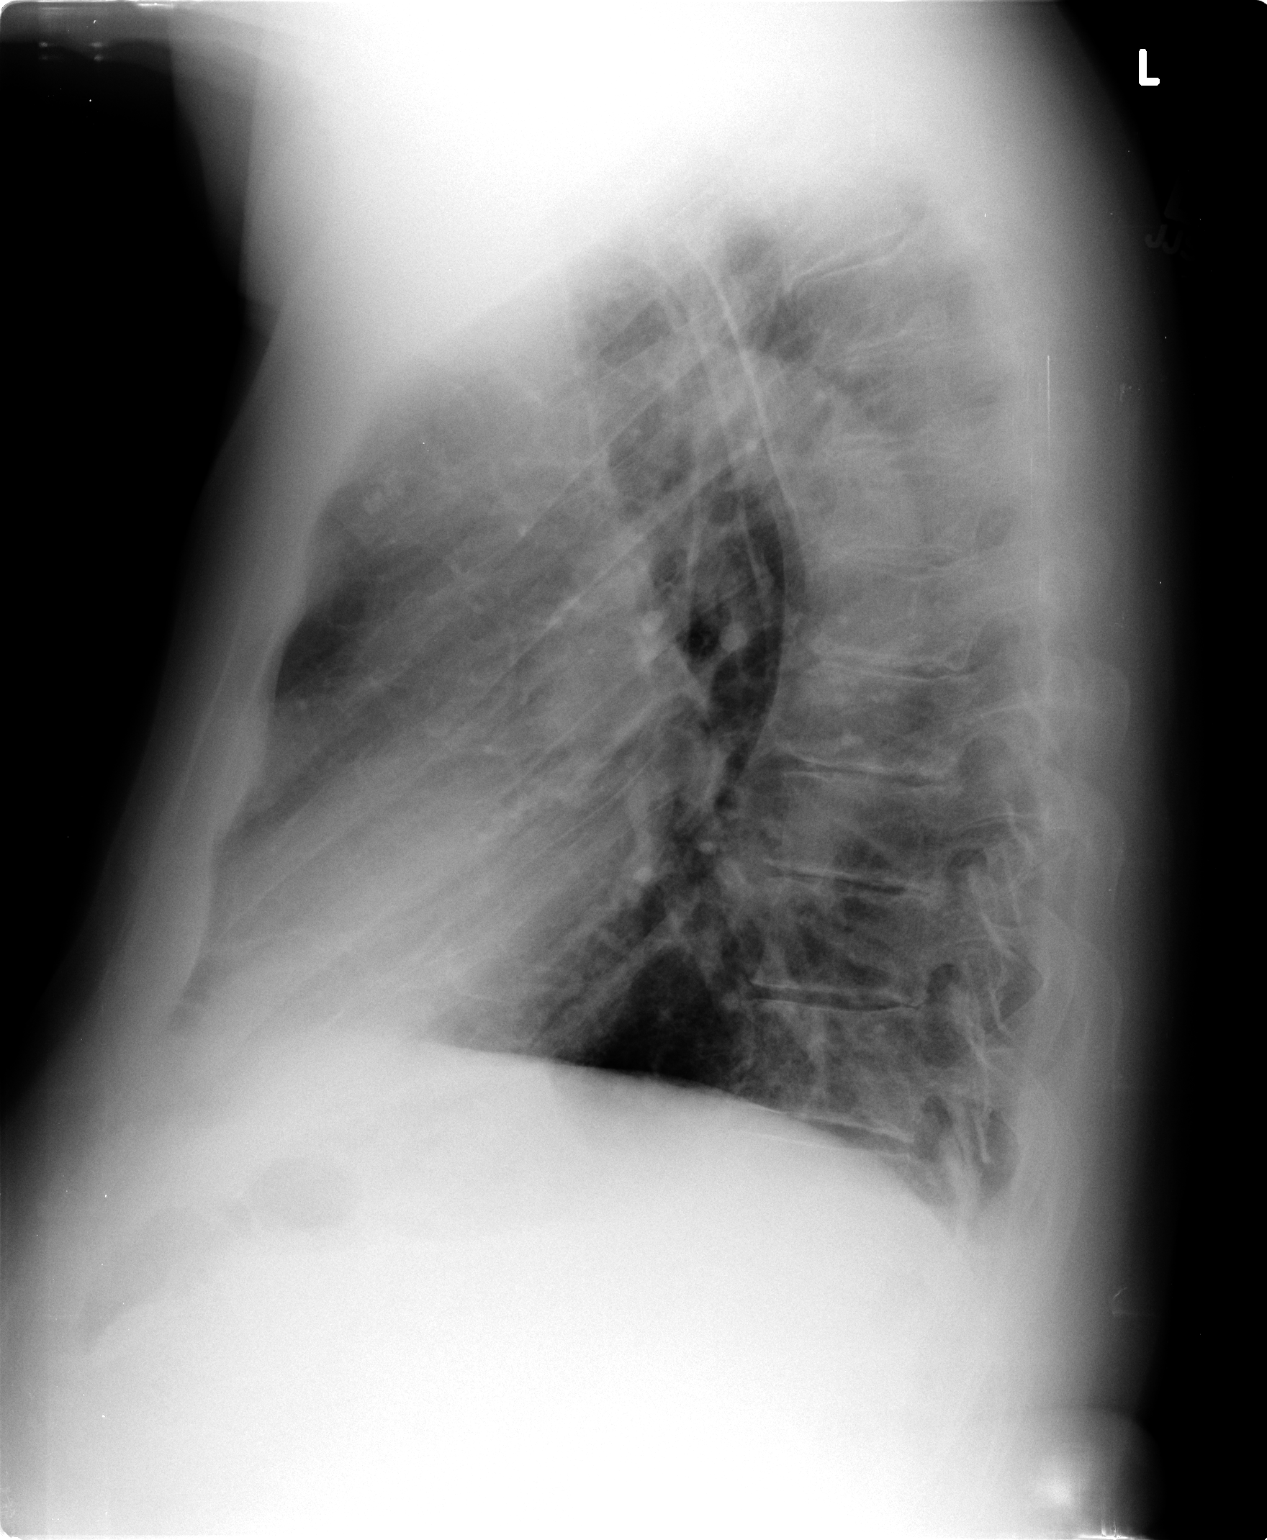

[2 of 2 positions shown; findings below may reference images not displayed]

FINDINGS: A few calcified granulomata are seen as on prior CT.
Lungs otherwise clear.  No pneumothorax or pleural effusion.  Heart
size is normal.
IMPRESSION: No acute finding.

## 2014-06-12 DIAGNOSIS — R896 Abnormal cytological findings in specimens from other organs, systems and tissues: Secondary | ICD-10-CM | POA: Diagnosis not present

## 2014-06-12 DIAGNOSIS — R312 Other microscopic hematuria: Secondary | ICD-10-CM | POA: Diagnosis not present

## 2014-06-25 ENCOUNTER — Ambulatory Visit (INDEPENDENT_AMBULATORY_CARE_PROVIDER_SITE_OTHER): Payer: Medicare Other | Admitting: Cardiology

## 2014-06-25 ENCOUNTER — Encounter: Payer: Self-pay | Admitting: Cardiology

## 2014-06-25 VITALS — BP 146/80 | HR 73 | Ht 75.0 in | Wt 198.0 lb

## 2014-06-25 DIAGNOSIS — E785 Hyperlipidemia, unspecified: Secondary | ICD-10-CM | POA: Diagnosis not present

## 2014-06-25 DIAGNOSIS — R002 Palpitations: Secondary | ICD-10-CM

## 2014-06-25 DIAGNOSIS — Z8249 Family history of ischemic heart disease and other diseases of the circulatory system: Secondary | ICD-10-CM

## 2014-06-25 DIAGNOSIS — I10 Essential (primary) hypertension: Secondary | ICD-10-CM | POA: Diagnosis not present

## 2014-06-25 NOTE — Patient Instructions (Addendum)
Your physician has recommended you make the following change in your medication:    DR Grainfield TO TAKE OTC RED YEAST RICE AS INDICATED ON THE BOTTLE--YOU CAN PURCHASE THIS AT [place] physician has recommended that you wear a 48 HOUR holter monitor. Holter monitors are medical devices that record the heart's electrical activity. Doctors most often use these monitors to diagnose arrhythmias. Arrhythmias are problems with the speed or rhythm of the heartbeat. The monitor is a small, portable device. You can wear one while you do your normal daily activities. This is usually used to diagnose what is causing palpitations/syncope (passing out).   Your physician has requested that you have an echocardiogram. Echocardiography is a painless test that uses sound waves to create images of your heart. It provides your doctor with information about the size and shape of your heart and how well your heart's chambers and valves are working. This procedure takes approximately one hour. There are no restrictions for this procedure.   Your physician recommends that you schedule a follow-up appointment in: [place]

## 2014-06-25 NOTE — Progress Notes (Signed)
Patient ID: ZYGMUNT MCGLINN, male   DOB: 1940-07-16, 74 y.o.   MRN: [id number]      Cardiology Office Note   Date:  06/25/2014   ID:  Alex Weiss, DOB [birth year], MRN [id number]  PCP:  Laurey Morale, MD  Cardiologist:  Dorothy Spark, MD   Chief complain: chest pain, palpitations   History of Present Illness: Alex Weiss is a 74 y.o. male who presents for evaluation of palpitations. The patient is a 74 year old gentleman with no significant past medical history. The patient states that about 3 years ago he was experiencing the pedicle exertional chest pain and saw his primary care physician who on request that exercise nuclear stress test. Test was performed on 11/03/2011 and showed excellent exercise capacity, normal blood pressure response, there was mild chest pain, normal EKG at rest and at stress and no scar or ischemia seen on perfusion images. LVEF was 59% of the time. The patient has history of a obstructive sleep apnea, never treated. About a year ago he and his wife decided to start walking 2 miles daily and on to lose some weight. He lost overall 25 pounds and he is on exertional chest pain has resolved. On 2 occasions in the last few months he experienced episode of palpitations one was walking uphill and lasted for a few seconds was associated with dizziness. The other occasion he was leaning on about the table and undeveloped 15 seconds lasting palpitations associated with dizziness on, dizziness persisted for about 20 minutes and he went to the ER. Troponin was negative 2, EKG showed just sinus bradycardia otherwise normal EKG. Patient was sent home and referred to [place] for further evaluation. Today he states that he eats really healthy, he is last cholesterol was checked by his primary care physician in May of last year showed HDL 53, triglycerides 41 and LDL of 117. Patient's wife states that his snoring improved significantly since he lost his weight. Patient has concern because  his father had 5 myocardial infarctions, the first one was at age 40 and he passed away at age of 103.   Past Medical History  Diagnosis Date  . FH: colonic polyps   . History of nephrolithiasis     1962  . Hematuria, microscopic   . Arthritis   . GERD (gastroesophageal reflux disease)   . BPH (benign prostatic hypertrophy)   . Renal calculus, left     NON-OBSTRUCTING  . History of ulcerative colitis   . History of prostatitis   . Wears glasses     Past Surgical History  Procedure Laterality Date  . Back surgery    . Colonoscopy  04-15-09    per Dr. 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 Tracheostomy  AGE 61 MONTHS OLD    STREP THROAT  . Cervical laminectomy  02-04-2004    C7 -- T2  . Tonsillectomy  AGE 24  . Anterior cervical decomp/discectomy fusion  1996    C3 --  C5  . Left quad tendon repair  2007  . Wrist ganglion excision Right 2003  . Cystoscopy with biopsy N/A 09/05/2012    Procedure: CYSTOSCOPY WITH BIOPSY;  Surgeon: Molli Hazard, MD;  Location: [facility];  Service: Urology;  Laterality: N/A;     Current Outpatient Prescriptions  Medication Sig Dispense Refill  . aspirin 81 MG tablet Take 81 mg by mouth daily.    . famotidine (PEPCID) 20 MG  tablet Take 20 mg by mouth 2 (two) times daily.     . folic acid (FOLVITE) 1 MG tablet Take 1 mg by mouth daily.      . metaxalone (SKELAXIN) 400 MG tablet Take by mouth as needed for pain.     . Multiple Vitamin (MULTIVITAMIN) tablet Take 1 tablet by mouth daily.    . sulindac (CLINORIL) 200 MG tablet TAKE 1 TABLET DAILY 90 tablet 3  . triamcinolone cream (KENALOG) 0.1 % Apply 1 application topically 2 (two) times daily. 30 g 5  . meclizine (ANTIVERT) 25 MG tablet Take 1 tablet (25 mg total) by mouth every 4 (four) hours as needed for dizziness. (Patient not taking: Reported on 05/28/2014) 60 tablet 5   No current facility-administered medications for this visit.     Allergies:   Ivp dye; Ivp dye; Penicillins; and Penicillins    Social History:  The patient  reports that he has quit smoking. His smoking use included Cigarettes. He has a 34 pack-year smoking history. He has never used smokeless tobacco. He reports that he drinks alcohol. He reports that he does not use illicit drugs.   Family History:  The patient's family history includes Aortic dissection in his brother; Aortic stenosis in his sister; Arthritis in his other; Cancer in his other; Coronary artery disease in his other; Depression in his other; Hypertension in his other; Stroke in his other.    ROS:  Please see the history of present illness.   All other systems are reviewed and negative.    PHYSICAL EXAM: VS:  BP 146/80 mmHg  Pulse 73  Ht 6\' 3"  (1.905 m)  Wt 198 lb (89.812 kg)  BMI 24.75 kg/m2  SpO2 97% , BMI Body mass index is 24.75 kg/(m^2). GEN: Well nourished, well developed, in no acute distress HEENT: normal Neck: no JVD, carotid bruits, or masses Cardiac: RRR; no murmurs, rubs, or gallops,no edema  Respiratory:  clear to auscultation bilaterally, normal work of breathing GI: soft, nontender, nondistended, + BS MS: no deformity or atrophy Skin: warm and dry, no rash Neuro:  Strength and sensation are intact Psych: euthymic mood, full affect   EKG:  EKG is not ordered today. I have reviewed EKG from ER on 05/19/2014 and he chose sinus bradycardia and otherwise normal EKG.   Recent Labs: 08/15/2013: ALT 25; TSH 0.74 05/19/2014: BUN 21; Creatinine 0.76; Hemoglobin 16.4; Platelets 132*; Potassium 4.7; Sodium 138    Lipid Panel    Component Value Date/Time   CHOL 178 08/15/2013 1118   TRIG 41.0 08/15/2013 1118   HDL 53.10 08/15/2013 1118   CHOLHDL 3 08/15/2013 1118   VLDL 8.2 08/15/2013 1118   LDLCALC 117* 08/15/2013 1118      Wt Readings from Last 3 Encounters:  06/25/14 198 lb (89.812 kg)  05/28/14 201 lb (91.173 kg)  05/19/14 195 lb (88.451 kg)       Other studies Reviewed: Additional studies/ records that were reviewed today include: Exercise nuclear stress test from August 2013 that was normal. I reviewed his labs including electrolytes, kidney or liver function and lipid profile from May 2015 and February 2016.   ASSESSMENT AND PLAN:  74 year old gentleman  1. Palpitations - we'll start 48 hour monitor for to evaluate for possible asymptomatic atrial fibrillation, since his episodes are so infrequent for now will just observe in case we don't find any arrhythmia. His TSH was normal last year. We will also order an echocardiogram to evaluate for chamber size  and function.  2. 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 Echocardiogram will help [place] distinguish if he has any LVH and needs to be treated.  3. Hyperlipidemia - his LDL was 117 one year ago however since then he lost 25 pounds, he is advised to start using red yeast rice and follow blood work in May, I believe by now he might be at goal.  Follow-up in one month.   Current medicines are reviewed at length with the patient today.  The patient does not have concerns regarding medicines.  The following changes have been made:  no change  Labs/ tests ordered today include: 48 hour holter and echocardiogram.  No orders of the defined types were placed in this encounter.     Disposition:   FU with Ena Dawley H  in 1 month   Signed, Dorothy Spark, MD  06/25/2014 10:24 AM    [facility] [place], [place], [place]  [address] Phone: [phone]; Fax: [phone]

## 2014-06-27 ENCOUNTER — Encounter: Payer: Self-pay | Admitting: Radiology

## 2014-06-27 ENCOUNTER — Telehealth: Payer: Self-pay | Admitting: Cardiology

## 2014-06-27 ENCOUNTER — Encounter (INDEPENDENT_AMBULATORY_CARE_PROVIDER_SITE_OTHER): Payer: Medicare Other

## 2014-06-27 DIAGNOSIS — R002 Palpitations: Secondary | ICD-10-CM

## 2014-06-27 DIAGNOSIS — E785 Hyperlipidemia, unspecified: Secondary | ICD-10-CM

## 2014-06-27 NOTE — Progress Notes (Signed)
Patient ID: Alex Weiss, male   DOB: 10/29/1940, 74 y.o.   MRN: [id number] E cardio 48hr holter applied.

## 2014-06-27 NOTE — Telephone Encounter (Signed)
Spoke to patient and explained all he needs to do is wear the monitor and write down if he feels palpitations.

## 2014-06-27 NOTE — Telephone Encounter (Signed)
New problem    Pt has question about his monitor and what he is suppose to be monitoring,

## 2014-06-30 ENCOUNTER — Ambulatory Visit (HOSPITAL_COMMUNITY): Payer: Medicare Other | Attending: Internal Medicine | Admitting: Radiology

## 2014-06-30 DIAGNOSIS — E785 Hyperlipidemia, unspecified: Secondary | ICD-10-CM | POA: Diagnosis not present

## 2014-06-30 DIAGNOSIS — Z79899 Other long term (current) drug therapy: Secondary | ICD-10-CM

## 2014-06-30 DIAGNOSIS — R002 Palpitations: Secondary | ICD-10-CM

## 2014-06-30 NOTE — Progress Notes (Signed)
Echocardiogram performed.  

## 2014-07-07 ENCOUNTER — Telehealth: Payer: Self-pay | Admitting: *Deleted

## 2014-07-07 NOTE — Telephone Encounter (Signed)
Informed the pt of infrequent PVCs and PACs, no runs, no afib on 24 hour holter monitor per Dr Meda Coffee.  Pt states he feels great, and is asymptomatic.  Pt verbalized understanding of results.

## 2014-07-08 ENCOUNTER — Other Ambulatory Visit (HOSPITAL_COMMUNITY): Payer: Self-pay

## 2014-07-31 ENCOUNTER — Encounter: Payer: Self-pay | Admitting: Cardiology

## 2014-07-31 ENCOUNTER — Ambulatory Visit (INDEPENDENT_AMBULATORY_CARE_PROVIDER_SITE_OTHER): Payer: Medicare Other | Admitting: Cardiology

## 2014-07-31 VITALS — BP 128/70 | HR 65 | Ht 75.0 in | Wt 198.8 lb

## 2014-07-31 DIAGNOSIS — R002 Palpitations: Secondary | ICD-10-CM

## 2014-07-31 DIAGNOSIS — E785 Hyperlipidemia, unspecified: Secondary | ICD-10-CM

## 2014-07-31 DIAGNOSIS — I491 Atrial premature depolarization: Secondary | ICD-10-CM

## 2014-07-31 DIAGNOSIS — I493 Ventricular premature depolarization: Secondary | ICD-10-CM | POA: Diagnosis not present

## 2014-07-31 NOTE — Patient Instructions (Signed)
Your physician recommends that you continue on your current medications as directed. Please refer to the Current Medication list given to you today.   Your physician wants you to follow-up in: ONE YEAR WITH DR NELSON You will receive a reminder letter in the mail two months in advance. If you don't receive a letter, please call our office to schedule the follow-up appointment.  

## 2014-07-31 NOTE — Progress Notes (Signed)
Patient ID: Alex Weiss, male   DOB: 1940-10-05, 74 y.o.   MRN: [id number]      Cardiology Office Note   Date:  08/06/2014   ID:  Alex Weiss, DOB [birth year], MRN [id number]  PCP:  Alex Morale, MD  Cardiologist:  Alex Spark, MD   Chief complain: chest pain, palpitations   History of Present Illness: Alex Weiss is a 74 y.o. male who presents for evaluation of palpitations. The patient is a 74 year old gentleman with no significant past medical history. The patient states that about 3 years ago he was experiencing the pedicle exertional chest pain and saw his primary care physician who on request that exercise nuclear stress test. Test was performed on 11/03/2011 and showed excellent exercise capacity, normal blood pressure response, there was mild chest pain, normal EKG at rest and at stress and no scar or ischemia seen on perfusion images. LVEF was 59% of the time. The patient has history of a obstructive sleep apnea, never treated. About a year ago he and his wife decided to start walking 2 miles daily and on to lose some weight. He lost overall 25 pounds and he is on exertional chest pain has resolved. On 2 occasions in the last few months he experienced episode of palpitations one was walking uphill and lasted for a few seconds was associated with dizziness. The other occasion he was leaning on about the table and undeveloped 15 seconds lasting palpitations associated with dizziness on, dizziness persisted for about 20 minutes and he went to the ER. Troponin was negative 2, EKG showed just sinus bradycardia otherwise normal EKG. Patient was sent home and referred to [place] for further evaluation. Today he states that he eats really healthy, he is last cholesterol was checked by his primary care physician in May of last year showed HDL 53, triglycerides 41 and LDL of 117. Patient's wife states that his snoring improved significantly since he lost his weight. Patient has concern because  his father had 5 myocardial infarctions, the first one was at age 14 and he passed away at age of 88.  08/06/2014 - no more episodes of palpitations, exercises every day, no DOE, no CP. Borderline cholesterol, decided not to take red yeast rice yet, will check his lipids at his PCP in May. Concerned about prognosis as many of his family members had CAD.   Past Medical History  Diagnosis Date  . FH: colonic polyps   . History of nephrolithiasis     1962  . Hematuria, microscopic   . Arthritis   . GERD (gastroesophageal reflux disease)   . BPH (benign prostatic hypertrophy)   . Renal calculus, left     NON-OBSTRUCTING  . History of ulcerative colitis   . History of prostatitis   . Wears glasses     Past Surgical History  Procedure Laterality Date  . Back surgery    . Colonoscopy  04-15-09    per Dr. 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 Tracheostomy  AGE 45 MONTHS OLD    STREP THROAT  . Cervical laminectomy  02-04-2004    C7 -- T2  . Tonsillectomy  AGE 63  . Anterior cervical decomp/discectomy fusion  1996    C3 --  C5  . Left quad tendon repair  2007  . Wrist ganglion excision Right 2003  . Cystoscopy with biopsy N/A 09/05/2012    Procedure: CYSTOSCOPY WITH BIOPSY;  Surgeon: Alex Hazard,  MD;  Location: [facility];  Service: Urology;  Laterality: N/A;   Current Outpatient Prescriptions  Medication Sig Dispense Refill  . aspirin 81 MG tablet Take 81 mg by mouth daily.    . famotidine (PEPCID) 20 MG tablet Take 20 mg by mouth 2 (two) times daily.     . folic acid (FOLVITE) 1 MG tablet Take 1 mg by mouth daily.      . metaxalone (SKELAXIN) 400 MG tablet Take by mouth as needed for pain.     . Multiple Vitamin (MULTIVITAMIN) tablet Take 1 tablet by mouth daily.    . sulindac (CLINORIL) 200 MG tablet TAKE 1 TABLET DAILY 90 tablet 3  . triamcinolone cream (KENALOG) 0.1 % Apply 1 application topically 2 (two) times daily. 30 g 5    No current facility-administered medications for this visit.   Allergies:   Ivp dye; Ivp dye; Penicillins; and Penicillins   Social History:  The patient  reports that he has quit smoking. His smoking use included Cigarettes. He has a 34 pack-year smoking history. He has never used smokeless tobacco. He reports that he drinks alcohol. He reports that he does not use illicit drugs.   Family History:  The patient's family history includes Aortic dissection in his brother; Aortic stenosis in his sister; Arthritis in his other; Cancer in his other; Coronary artery disease in his other; Depression in his other; Hypertension in his other; Stroke in his other.    ROS:  Please see the history of present illness.   All other systems are reviewed and negative.    PHYSICAL EXAM: VS:  BP 128/70 mmHg  Pulse 65  Ht 6\' 3"  (1.905 m)  Wt 198 lb 12.8 oz (90.175 kg)  BMI 24.85 kg/m2  SpO2 98% , BMI Body mass index is 24.85 kg/(m^2). GEN: Well nourished, well developed, in no acute distress HEENT: normal Neck: no JVD, carotid bruits, or masses Cardiac: RRR; no murmurs, rubs, or gallops,no edema  Respiratory:  clear to auscultation bilaterally, normal work of breathing GI: soft, nontender, nondistended, + BS MS: no deformity or atrophy Skin: warm and dry, no rash Neuro:  Strength and sensation are intact Psych: euthymic mood, full affect  EKG:  EKG is not ordered today. I have reviewed EKG from ER on 05/19/2014 and he chose sinus bradycardia and otherwise normal EKG.  Recent Labs: 08/15/2013: ALT 25; TSH 0.74 05/19/2014: BUN 21; Creatinine 0.76; Hemoglobin 16.4; Platelets 132*; Potassium 4.7; Sodium 138   Lipid Panel    Component Value Date/Time   CHOL 178 08/15/2013 1118   TRIG 41.0 08/15/2013 1118   HDL 53.10 08/15/2013 1118   CHOLHDL 3 08/15/2013 1118   VLDL 8.2 08/15/2013 1118   LDLCALC 117* 08/15/2013 1118   Wt Readings from Last 3 Encounters:  07/31/14 198 lb 12.8 oz (90.175 kg)   06/25/14 198 lb (89.812 kg)  05/28/14 201 lb (91.173 kg)    Other studies Reviewed: Additional studies/ records that were reviewed today include: Exercise nuclear stress test from August 2013 that was normal. I reviewed his labs including electrolytes, kidney or liver function and lipid profile from May 2015 and February 2016.  Echocardiogram: 06/30/14  Left ventricle: The cavity size was normal. There was mild focal basal hypertrophy of the septum. Systolic function was normal. The estimated ejection fraction was in the range of 55% to 60%. Wall motion was normal; there were no regional wall motion abnormalities. Left ventricular diastolic function parameters were normal. -  Mitral valve: Mildly thickened leaflets, no prolapse. There was trivial regurgitation. - Left atrium: Moderately dilated at 40 ml/m2. - Right ventricle: The cavity size was normal. Wall thickness was normal. The moderator band was prominent. Systolic function was normal. - Right atrium: The atrium was mildly dilated. - Tricuspid valve: There was mild regurgitation. - Pulmonic valve: The valve appears to be grossly normal. There was mild regurgitation. - Pulmonary arteries: PA peak pressure: 30 mm Hg (S). - Inferior vena cava: The vessel was normal in size. The respirophasic diameter changes were in the normal range (>= 50%), consistent with normal central venous pressure.  Impressions:  - LVEF 55-60%, mild focal basal septal hypertrophy, normal wall motion, normal diastolic function, mild RAE, moderate LAE, trivial MR, mild TR, normal RVSP.    ASSESSMENT AND PLAN:  74 year old gentleman  1. Palpitations - we'll start 48 hour monitor for to evaluate for possible asymptomatic atrial fibrillation, since his episodes are so infrequent for now will just observe in case we don't find any arrhythmia. His TSH was normal last year. We will also order an echocardiogram to evaluate for  chamber size and function. Holter showed infrequent PACs and PVCs, no runs, no a-fib.  2. Hypertension - checked today.  3. Hyperlipidemia - his LDL was 117 one year ago however since then he lost 25 pounds, he is advised to start using red yeast rice and follow blood work in May, I believe by now he might be at goal. I suggested calcium score to reclassify this asymptomatic patient with significant family h/o premature CAD, he will think about it.  Follow-up in one month.  Current medicines are reviewed at length with the patient today.  The patient does not have concerns regarding medicines.  The following changes have been made:  no change  Labs/ tests ordered today include: 48 hour holter and echocardiogram.  No orders of the defined types were placed in this encounter.     Disposition:   FU with Ena Dawley H  in 1 month   Signed, Alex Spark, MD  07/31/2014 9:40 AM    [facility] [place], [place], [place]  [address] Phone: [phone]; Fax: [phone]

## 2014-08-08 ENCOUNTER — Telehealth: Payer: Self-pay | Admitting: Family Medicine

## 2014-08-08 DIAGNOSIS — R351 Nocturia: Secondary | ICD-10-CM

## 2014-08-08 DIAGNOSIS — R739 Hyperglycemia, unspecified: Secondary | ICD-10-CM

## 2014-08-08 NOTE — Telephone Encounter (Signed)
I spoke with pt and he is going to cancel the lab appointment, he will see Dr. [name] and then get labs done.

## 2014-08-08 NOTE — Telephone Encounter (Signed)
Labs added per Dr [name].  Please inform patient. thanks

## 2014-08-08 NOTE — Telephone Encounter (Signed)
Pt will come in for cpx labs June 6.  Pt would liek to add a1c and psa to those labs. Is that OK?

## 2014-08-11 DIAGNOSIS — L812 Freckles: Secondary | ICD-10-CM | POA: Diagnosis not present

## 2014-08-11 DIAGNOSIS — L821 Other seborrheic keratosis: Secondary | ICD-10-CM | POA: Diagnosis not present

## 2014-08-11 DIAGNOSIS — D1801 Hemangioma of skin and subcutaneous tissue: Secondary | ICD-10-CM | POA: Diagnosis not present

## 2014-08-11 DIAGNOSIS — L570 Actinic keratosis: Secondary | ICD-10-CM | POA: Diagnosis not present

## 2014-08-11 DIAGNOSIS — D2362 Other benign neoplasm of skin of left upper limb, including shoulder: Secondary | ICD-10-CM | POA: Diagnosis not present

## 2014-08-11 DIAGNOSIS — D2371 Other benign neoplasm of skin of right lower limb, including hip: Secondary | ICD-10-CM | POA: Diagnosis not present

## 2014-08-22 ENCOUNTER — Encounter: Payer: Self-pay | Admitting: Gastroenterology

## 2014-09-08 ENCOUNTER — Other Ambulatory Visit: Payer: Self-pay

## 2014-09-09 DIAGNOSIS — H1851 Endothelial corneal dystrophy: Secondary | ICD-10-CM | POA: Diagnosis not present

## 2014-09-09 DIAGNOSIS — H2513 Age-related nuclear cataract, bilateral: Secondary | ICD-10-CM | POA: Diagnosis not present

## 2014-09-09 DIAGNOSIS — H52221 Regular astigmatism, right eye: Secondary | ICD-10-CM | POA: Diagnosis not present

## 2014-09-12 ENCOUNTER — Encounter: Payer: Self-pay | Admitting: Family Medicine

## 2014-09-16 ENCOUNTER — Encounter: Payer: Self-pay | Admitting: Family Medicine

## 2014-09-16 ENCOUNTER — Ambulatory Visit (INDEPENDENT_AMBULATORY_CARE_PROVIDER_SITE_OTHER): Payer: Medicare Other | Admitting: Family Medicine

## 2014-09-16 VITALS — BP 150/88 | HR 56 | Temp 98.4°F | Ht 75.0 in | Wt 198.0 lb

## 2014-09-16 DIAGNOSIS — R030 Elevated blood-pressure reading, without diagnosis of hypertension: Secondary | ICD-10-CM | POA: Diagnosis not present

## 2014-09-16 DIAGNOSIS — M159 Polyosteoarthritis, unspecified: Secondary | ICD-10-CM

## 2014-09-16 DIAGNOSIS — M150 Primary generalized (osteo)arthritis: Secondary | ICD-10-CM

## 2014-09-16 DIAGNOSIS — N401 Enlarged prostate with lower urinary tract symptoms: Secondary | ICD-10-CM | POA: Diagnosis not present

## 2014-09-16 DIAGNOSIS — R002 Palpitations: Secondary | ICD-10-CM

## 2014-09-16 DIAGNOSIS — E785 Hyperlipidemia, unspecified: Secondary | ICD-10-CM | POA: Diagnosis not present

## 2014-09-16 DIAGNOSIS — R739 Hyperglycemia, unspecified: Secondary | ICD-10-CM | POA: Insufficient documentation

## 2014-09-16 DIAGNOSIS — N138 Other obstructive and reflux uropathy: Secondary | ICD-10-CM

## 2014-09-16 DIAGNOSIS — IMO0001 Reserved for inherently not codable concepts without codable children: Secondary | ICD-10-CM

## 2014-09-16 LAB — CBC WITH DIFFERENTIAL/PLATELET
Basophils Absolute: 0 10*3/uL (ref 0.0–0.1)
Basophils Relative: 0.2 % (ref 0.0–3.0)
EOS ABS: 0.2 10*3/uL (ref 0.0–0.7)
Eosinophils Relative: 5.2 % — ABNORMAL HIGH (ref 0.0–5.0)
HCT: 47.9 % (ref 39.0–52.0)
Hemoglobin: 16.4 g/dL (ref 13.0–17.0)
LYMPHS ABS: 1.2 10*3/uL (ref 0.7–4.0)
Lymphocytes Relative: 27.4 % (ref 12.0–46.0)
MCHC: 34.3 g/dL (ref 30.0–36.0)
MCV: 92.7 fl (ref 78.0–100.0)
MONOS PCT: 9 % (ref 3.0–12.0)
Monocytes Absolute: 0.4 10*3/uL (ref 0.1–1.0)
NEUTROS ABS: 2.5 10*3/uL (ref 1.4–7.7)
Neutrophils Relative %: 58.2 % (ref 43.0–77.0)
Platelets: 152 10*3/uL (ref 150.0–400.0)
RBC: 5.17 Mil/uL (ref 4.22–5.81)
RDW: 13.1 % (ref 11.5–15.5)
WBC: 4.3 10*3/uL (ref 4.0–10.5)

## 2014-09-16 LAB — LIPID PANEL
CHOLESTEROL: 179 mg/dL (ref 0–200)
HDL: 45 mg/dL (ref >39.00)
LDL Cholesterol: 118 mg/dL — ABNORMAL HIGH (ref 0–99)
NonHDL: 134
TRIGLYCERIDES: 80 mg/dL (ref 0.0–149.0)
Total CHOL/HDL Ratio: 4
VLDL: 16 mg/dL (ref 0.0–40.0)

## 2014-09-16 LAB — BASIC METABOLIC PANEL
BUN: 24 mg/dL — AB (ref 6–23)
CHLORIDE: 103 meq/L (ref 96–112)
CO2: 33 meq/L — AB (ref 19–32)
Calcium: 9.5 mg/dL (ref 8.4–10.5)
Creatinine, Ser: 0.81 mg/dL (ref 0.40–1.50)
GFR: 98.98 mL/min (ref >60.00)
Glucose, Bld: 99 mg/dL (ref 70–99)
Potassium: 4.4 mEq/L (ref 3.5–5.1)
Sodium: 139 mEq/L (ref 135–145)

## 2014-09-16 LAB — POCT URINALYSIS DIPSTICK
Bilirubin, UA: NEGATIVE
Glucose, UA: NEGATIVE
KETONES UA: NEGATIVE
Leukocytes, UA: NEGATIVE
Nitrite, UA: NEGATIVE
PROTEIN UA: NEGATIVE
SPEC GRAV UA: 1.02
Urobilinogen, UA: 0.2
pH, UA: 6

## 2014-09-16 LAB — HEPATIC FUNCTION PANEL
ALT: 16 U/L (ref 0–53)
AST: 19 U/L (ref 0–37)
Albumin: 4.5 g/dL (ref 3.5–5.2)
Alkaline Phosphatase: 74 U/L (ref 39–117)
Bilirubin, Direct: 0.2 mg/dL (ref 0.0–0.3)
TOTAL PROTEIN: 6.6 g/dL (ref 6.0–8.3)
Total Bilirubin: 0.9 mg/dL (ref 0.2–1.2)

## 2014-09-16 LAB — PSA: PSA: 1.48 ng/mL (ref 0.10–4.00)

## 2014-09-16 LAB — HEMOGLOBIN A1C: Hgb A1c MFr Bld: 5.1 % (ref 4.6–6.5)

## 2014-09-16 LAB — TSH: TSH: 0.94 u[IU]/mL (ref 0.35–4.50)

## 2014-09-16 MED ORDER — SULINDAC 200 MG PO TABS: 200.0000 mg | ORAL_TABLET | Freq: Every day | ORAL | Status: DC

## 2014-09-16 MED ORDER — METAXALONE 400 MG PO TABS: 400.0000 mg | ORAL_TABLET | Freq: Four times a day (QID) | ORAL | Status: DC

## 2014-09-16 NOTE — Progress Notes (Signed)
   Subjective:    Patient ID: Alex Weiss, male    DOB: 1941/01/10, 74 y.o.   MRN: [id number]  HPI 74 yr old male to follow up. His BP has been stable at home. He watches his diet and walks 2 miles a day. He had a workup with Dr. Meda Coffee for palpitations and it turned out to be only PACs and PVCs. His arthritis is stable, and he as been able to decrease his dose of Sulindac from bid to once daily. I reviewed his past medical, surgical, and family hx and there are no changes.    Review of Systems  Constitutional: Negative.   HENT: Negative.   Eyes: Negative.   Respiratory: Negative.   Cardiovascular: Negative.   Gastrointestinal: Negative.   Genitourinary: Negative.   Musculoskeletal: Negative.   Skin: Negative.   Neurological: Negative.   Psychiatric/Behavioral: Negative.        Objective:   Physical Exam  Constitutional: He is oriented to person, place, and time. He appears well-developed and well-nourished. No distress.  HENT:  Head: Normocephalic and atraumatic.  Right Ear: External ear normal.  Left Ear: External ear normal.  Nose: Nose normal.  Mouth/Throat: Oropharynx is clear and moist. No oropharyngeal exudate.  Eyes: Conjunctivae and EOM are normal. Pupils are equal, round, and reactive to light. Right eye exhibits no discharge. Left eye exhibits no discharge. No scleral icterus.  Neck: Neck supple. No JVD present. No tracheal deviation present. No thyromegaly present.  Cardiovascular: Normal rate, regular rhythm, normal heart sounds and intact distal pulses.  Exam reveals no gallop and no friction rub.   No murmur heard. Pulmonary/Chest: Effort normal and breath sounds normal. No respiratory distress. He has no wheezes. He has no rales. He exhibits no tenderness.  Abdominal: Soft. Bowel sounds are normal. He exhibits no distension and no mass. There is no tenderness. There is no rebound and no guarding.  Genitourinary: Rectum normal and penis normal. Guaiac negative  stool. No penile tenderness.  Prostate is moderately enlarged, the right lobe is larger than the left. No nodules are felt  Musculoskeletal: Normal range of motion. He exhibits no edema or tenderness.  Lymphadenopathy:    He has no cervical adenopathy.  Neurological: He is alert and oriented to person, place, and time. He has normal reflexes. No cranial nerve deficit. He exhibits normal muscle tone. Coordination normal.  Skin: Skin is warm and dry. No rash noted. He is not diaphoretic. No erythema. No pallor.  Psychiatric: He has a normal mood and affect. His behavior is normal. Judgment and thought content normal.          Assessment & Plan:  His BP and arthritis are stable. Get fasting labs to day to check is lipids. He will see Dr. Roni Bread in December for a urology exam. He is scheduled for a colonoscopy in August with Dr. Ardis Hughs.

## 2014-09-18 ENCOUNTER — Telehealth: Payer: Self-pay | Admitting: Family Medicine

## 2014-09-18 NOTE — Telephone Encounter (Signed)
Pt states the metaxalone (SKELAXIN) 400 MG tablet should have gone to [facility] [place].  Pt states this is not a maintenance drug and he does not need a 90 day/ Pt takes PRN, would like metaxalone (SKELAXIN) 800 MG tab     Quatity 100 w/ one refill  [facility]  Pls cancel order for [facility]

## 2014-09-18 NOTE — Telephone Encounter (Signed)
I spoke with pt's wife and she said to just leave the script from [place], it's probably too late to change things now.

## 2014-10-28 ENCOUNTER — Ambulatory Visit (AMBULATORY_SURGERY_CENTER): Payer: Self-pay | Admitting: *Deleted

## 2014-10-28 VITALS — Ht 75.0 in | Wt 198.6 lb

## 2014-10-28 DIAGNOSIS — Z800 Family history of malignant neoplasm of digestive organs: Secondary | ICD-10-CM

## 2014-10-28 NOTE — Progress Notes (Signed)
No issues with sedation No egg or soy allergy No diet pills No home 02 emmi declined Hx PAC'S and PVC"S but no afib or flutter

## 2014-11-11 ENCOUNTER — Encounter: Payer: Self-pay | Admitting: Gastroenterology

## 2014-11-11 ENCOUNTER — Ambulatory Visit (AMBULATORY_SURGERY_CENTER): Payer: Medicare Other | Admitting: Gastroenterology

## 2014-11-11 VITALS — BP 123/67 | HR 52 | Temp 97.4°F | Resp 19 | Ht 75.0 in | Wt 198.0 lb

## 2014-11-11 DIAGNOSIS — Z1211 Encounter for screening for malignant neoplasm of colon: Secondary | ICD-10-CM | POA: Diagnosis not present

## 2014-11-11 DIAGNOSIS — N189 Chronic kidney disease, unspecified: Secondary | ICD-10-CM | POA: Diagnosis not present

## 2014-11-11 DIAGNOSIS — Z800 Family history of malignant neoplasm of digestive organs: Secondary | ICD-10-CM | POA: Diagnosis present

## 2014-11-11 HISTORY — PX: COLONOSCOPY: SHX174

## 2014-11-11 MED ORDER — SODIUM CHLORIDE 0.9 % IV SOLN: 500.0000 mL | INTRAVENOUS | Status: DC

## 2014-11-11 NOTE — Op Note (Signed)
[place]  [address] [place], [address]   COLONOSCOPY PROCEDURE REPORT  PATIENT: Alex Weiss, Alex Weiss  MR#: [id number] BIRTHDATE: 23-Oct-1940 , 74  yrs. old GENDER: male ENDOSCOPIST: Milus Banister, MD PROCEDURE DATE:  11/11/2014 PROCEDURE:   Colonoscopy, screening First Screening Colonoscopy - Avg.  risk and is 50 yrs.  old or older - No.  Prior Negative Screening - Now for repeat screening. Above average risk  History of Adenoma - Now for follow-up colonoscopy & has been > or = to 3 yrs.  N/A  high risk ASA CLASS:   Class II INDICATIONS:Screening for colonic neoplasia, FH Colon or Rectal Adenocarcinoma, and 1.  Left sided colitis (30cm) colonoscopy with Dr.  Ardis Hughs 04/2009, Normal terminal ileum (Biopsies confirmed chronic, active disease).  Symptoms of bleeding, mild urgency were well-controlled on mesalamine enemas.  May, 2011 stopped enemas after tapering to every other day without any problem.Marland Kitchen MEDICATIONS: Monitored anesthesia care and Propofol 175 mg IV  DESCRIPTION OF PROCEDURE:   After the risks benefits and alternatives of the procedure were thoroughly explained, informed consent was obtained.  The digital rectal exam revealed no abnormalities of the rectum.   The LB [id number]  endoscope was introduced through the anus and advanced to the cecum, which was identified by both the appendix and ileocecal valve. No adverse events experienced.   The quality of the prep was excellent.  The instrument was then slowly withdrawn as the colon was fully examined. Estimated blood loss is zero unless otherwise noted in this procedure report.   COLON FINDINGS: There was mild diverticulosis noted in the left colon.   The examination was otherwise normal.  Retroflexed views revealed no abnormalities. The time to cecum = 3.4 Withdrawal time = 8.1   The scope was withdrawn and the procedure completed. COMPLICATIONS: There were no immediate  complications.  ENDOSCOPIC IMPRESSION: 1.   Mild diverticulosis was noted in the left colon 2.   The examination was otherwise normal; no polyps or cancers  RECOMMENDATIONS: Given your significant family history of colon cancer, you should have a repeat colonoscopy in 5 years  eSigned:  Milus Banister, MD 11/11/2014 8:28 AM

## 2014-11-11 NOTE — Patient Instructions (Signed)
YOU HAD AN ENDOSCOPIC PROCEDURE TODAY AT THE [facility]:   Refer to the procedure report that was given to you for any specific questions about what was found during the examination.  If the procedure report does not answer your questions, please call your gastroenterologist to clarify.  If you requested that your care partner not be given the details of your procedure findings, then the procedure report has been included in a sealed envelope for you to review at your convenience later.  YOU SHOULD EXPECT: Some feelings of bloating in the abdomen. Passage of more gas than usual.  Walking can help get rid of the air that was put into your GI tract during the procedure and reduce the bloating. If you had a lower endoscopy (such as a colonoscopy or flexible sigmoidoscopy) you may notice spotting of blood in your stool or on the toilet paper. If you underwent a bowel prep for your procedure, you may not have a normal bowel movement for a few days.  Please Note:  You might notice some irritation and congestion in your nose or some drainage.  This is from the oxygen used during your procedure.  There is no need for concern and it should clear up in a day or so.  SYMPTOMS TO REPORT IMMEDIATELY:   Following lower endoscopy (colonoscopy or flexible sigmoidoscopy):  Excessive amounts of blood in the stool  Significant tenderness or worsening of abdominal pains  Swelling of the abdomen that is new, acute  Fever of 100F or higher   For urgent or emergent issues, a gastroenterologist can be reached at any hour by calling [phone].   DIET: Your first meal following the procedure should be a small meal and then it is ok to progress to your normal diet. Heavy or fried foods are harder to digest and may make you feel nauseous or bloated.  Likewise, meals heavy in dairy and vegetables can increase bloating.  Drink plenty of fluids but you should avoid alcoholic beverages for 24  hours.  ACTIVITY:  You should plan to take it easy for the rest of today and you should NOT DRIVE or use heavy machinery until tomorrow (because of the sedation medicines used during the test).    FOLLOW UP: Our staff will call the number listed on your records the next business day following your procedure to check on you and address any questions or concerns that you may have regarding the information given to you following your procedure. If we do not reach you, we will leave a message.  However, if you are feeling well and you are not experiencing any problems, there is no need to return our call.  We will assume that you have returned to your regular daily activities without incident.  If any biopsies were taken you will be contacted by phone or by letter within the next 1-3 weeks.  Please call us at [phone] if you have not heard about the biopsies in 3 weeks.    SIGNATURES/CONFIDENTIALITY: You and/or your care partner have signed paperwork which will be entered into your electronic medical record.  These signatures attest to the fact that that the information above on your After Visit Summary has been reviewed and is understood.  Full responsibility of the confidentiality of this discharge information lies with you and/or your care-partner. 

## 2014-11-11 NOTE — Progress Notes (Signed)
Transferred to recovery room. A/O x3, pleased with MAC.  VSS.  Report to Karen, RN. 

## 2014-11-12 ENCOUNTER — Telehealth: Payer: Self-pay | Admitting: *Deleted

## 2014-11-12 NOTE — Telephone Encounter (Signed)
  Follow up Call-  Call back number 11/11/2014  Post procedure Call Back phone  # [phone]  Permission to leave phone message Yes     Patient questions:  Do you have a fever, pain , or abdominal swelling? No. Pain Score  0 *  Have you tolerated food without any problems? Yes.    Have you been able to return to your normal activities? Yes.    Do you have any questions about your discharge instructions: Diet   No. Medications  No. Follow up visit  No.  Do you have questions or concerns about your Care? No.  Actions: * If pain score is 4 or above: No action needed, pain <4.

## 2015-01-07 DIAGNOSIS — Z23 Encounter for immunization: Secondary | ICD-10-CM | POA: Diagnosis not present

## 2015-02-23 ENCOUNTER — Encounter: Payer: Self-pay | Admitting: Family Medicine

## 2015-02-23 ENCOUNTER — Ambulatory Visit (INDEPENDENT_AMBULATORY_CARE_PROVIDER_SITE_OTHER): Payer: Medicare Other | Admitting: Family Medicine

## 2015-02-23 VITALS — BP 152/80 | HR 62 | Temp 98.1°F | Ht 75.0 in | Wt 200.0 lb

## 2015-02-23 DIAGNOSIS — H6123 Impacted cerumen, bilateral: Secondary | ICD-10-CM

## 2015-02-23 NOTE — Progress Notes (Signed)
   Subjective:    Patient ID: Alex Weiss, male    DOB: 07-17-1940, 74 y.o.   MRN: [id number]  HPI Here to remove earwax. He has had some hearing problems lately and he went to his hearing aid specialist, who told him he ad wax buildups. No pain.    Review of Systems  Constitutional: Negative.   HENT: Positive for hearing loss. Negative for congestion, ear discharge and ear pain.   Eyes: Negative.   Respiratory: Negative.        Objective:   Physical Exam  Constitutional: He is oriented to person, place, and time. He appears well-developed and well-nourished.  HENT:  Nose: Nose normal.  Mouth/Throat: Oropharynx is clear and moist.  Both ear canals are full of cerumen   Eyes: Conjunctivae are normal.  Neck: No thyromegaly present.  Lymphadenopathy:    He has no cervical adenopathy.  Neurological: He is oriented to person, place, and time. No cranial nerve deficit.          Assessment & Plan:  Cerumen impactions. Both ear canals were irrigated clear with water.

## 2015-02-23 NOTE — Progress Notes (Signed)
Pre visit review using our clinic review tool, if applicable. No additional management support is needed unless otherwise documented below in the visit note. 

## 2015-03-11 DIAGNOSIS — R3129 Other microscopic hematuria: Secondary | ICD-10-CM | POA: Diagnosis not present

## 2015-03-11 DIAGNOSIS — N200 Calculus of kidney: Secondary | ICD-10-CM | POA: Diagnosis not present

## 2015-03-12 ENCOUNTER — Encounter: Payer: Self-pay | Admitting: Family Medicine

## 2015-04-02 ENCOUNTER — Encounter: Payer: Self-pay | Admitting: *Deleted

## 2015-08-06 ENCOUNTER — Encounter: Payer: Self-pay | Admitting: *Deleted

## 2015-08-11 ENCOUNTER — Encounter: Payer: Self-pay | Admitting: Cardiology

## 2015-08-11 ENCOUNTER — Ambulatory Visit (INDEPENDENT_AMBULATORY_CARE_PROVIDER_SITE_OTHER): Payer: Medicare Other | Admitting: Cardiology

## 2015-08-11 VITALS — BP 150/90 | HR 66 | Ht 75.0 in | Wt 203.2 lb

## 2015-08-11 DIAGNOSIS — E785 Hyperlipidemia, unspecified: Secondary | ICD-10-CM

## 2015-08-11 DIAGNOSIS — I10 Essential (primary) hypertension: Secondary | ICD-10-CM

## 2015-08-11 DIAGNOSIS — I493 Ventricular premature depolarization: Secondary | ICD-10-CM | POA: Diagnosis not present

## 2015-08-11 DIAGNOSIS — R072 Precordial pain: Secondary | ICD-10-CM | POA: Diagnosis not present

## 2015-08-11 DIAGNOSIS — I491 Atrial premature depolarization: Secondary | ICD-10-CM

## 2015-08-11 DIAGNOSIS — R079 Chest pain, unspecified: Secondary | ICD-10-CM | POA: Insufficient documentation

## 2015-08-11 DIAGNOSIS — Z8249 Family history of ischemic heart disease and other diseases of the circulatory system: Secondary | ICD-10-CM

## 2015-08-11 NOTE — Patient Instructions (Signed)
Medication Instructions:   Your physician recommends that you continue on your current medications as directed. Please refer to the Current Medication list given to you today.    Testing/Procedures:  Your physician has requested that you have a stress echocardiogram. For further information please visit [email or web address]. Please follow instruction sheet as given.     Follow-Up:  Your physician wants you to follow-up in: [place] will receive a reminder letter in the mail two months in advance. If you don't receive a letter, please call our office to schedule the follow-up appointment.        If you need a refill on your cardiac medications before your next appointment, please call your pharmacy.

## 2015-08-11 NOTE — Progress Notes (Signed)
Patient ID: TARI JOINTER, male   DOB: [birth year], 75 y.o.   MRN: [id number]      Cardiology Office Note  Date:  2015/08/13   ID:  Alex Weiss, DOB [birth year], MRN [id number]  PCP:  Laurey Morale, MD  Cardiologist:  Dorothy Spark, MD   Chief complain: chest pain, palpitations   History of Present Illness: Alex Weiss is a 75 y.o. male who presents for evaluation of palpitations. The patient is a 75 year old gentleman with no significant past medical history. The patient states that about 3 years ago he was experiencing the pedicle exertional chest pain and saw his primary care physician who on request that exercise nuclear stress test. Test was performed on 11/03/2011 and showed excellent exercise capacity, normal blood pressure response, there was mild chest pain, normal EKG at rest and at stress and no scar or ischemia seen on perfusion images. LVEF was 59% of the time. The patient has history of a obstructive sleep apnea, never treated. About a year ago he and his wife decided to start walking 2 miles daily and on to lose some weight. He lost overall 25 pounds and he is on exertional chest pain has resolved. On 2 occasions in the last few months he experienced episode of palpitations one was walking uphill and lasted for a few seconds was associated with dizziness. The other occasion he was leaning on about the table and undeveloped 15 seconds lasting palpitations associated with dizziness on, dizziness persisted for about 20 minutes and he went to the ER. Troponin was negative 2, EKG showed just sinus bradycardia otherwise normal EKG. Patient was sent home and referred to [place] for further evaluation. Today he states that he eats really healthy, he is last cholesterol was checked by his primary care physician in May of last year showed HDL 53, triglycerides 41 and LDL of 117. Patient's wife states that his snoring improved significantly since he lost his weight. Patient has concern because his  father had 5 myocardial infarctions, the first one was at age 38 and he passed away at age of 17.  08/13/2015 - the patient is coming after one year he hasn't had more episodes of palpitations, he stays active and walks 2 miles every morning without any symptoms of shortness of breath or chest pain. However in the last 2 weeks he has felt chest pressure noted related to exertion that is persistent a feeling like if he is coming down with a cold he denies any syncope. He states that every time he goes to doctor's office his blood pressure is elevated but he checks it frequently at [facility] and is always in 130 range.   Past Medical History  Diagnosis Date  . FH: colonic polyps   . History of nephrolithiasis     1962  . Hematuria, microscopic   . Arthritis   . GERD (gastroesophageal reflux disease)   . BPH (benign prostatic hypertrophy)   . Renal calculus, left     NON-OBSTRUCTING  . History of ulcerative colitis   . History of prostatitis   . Wears glasses   . Allergy   . Cataract     starting   . PVC's (premature ventricular contractions)   . Premature atrial contraction     Past Surgical History  Procedure Laterality Date  . Back surgery    . Colonoscopy  04-15-09    per Dr. 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 Tracheostomy  AGE 7 MONTHS OLD    STREP THROAT  . Cervical laminectomy  02-04-2004    C7 -- T2  . Tonsillectomy  AGE 69  . Anterior cervical decomp/discectomy fusion  1996    C3 --  C5  . Left quad tendon repair  2007  . Wrist ganglion excision Right 2003  . Cystoscopy with biopsy N/A 09/05/2012    Procedure: CYSTOSCOPY WITH BIOPSY;  Surgeon: Molli Hazard, MD;  Location: [facility];  Service: Urology;  Laterality: N/A;   Current Outpatient Prescriptions  Medication Sig Dispense Refill  . aspirin 81 MG tablet Take 81 mg by mouth daily.    . famotidine (PEPCID) 20 MG tablet Take 20 mg by mouth 2 (two) times daily.     .  folic acid (FOLVITE) 1 MG tablet Take 1 mg by mouth daily.      . metaxalone (SKELAXIN) 400 MG tablet Take 400 mg by mouth 3 (three) times daily as needed (muscle spasms).    . Multiple Vitamin (MULTIVITAMIN) tablet Take 1 tablet by mouth daily.    Marland Kitchen OVER THE COUNTER MEDICATION Take 1 tablet by mouth daily. Allertec daily for allergies    . sulindac (CLINORIL) 200 MG tablet Take 1 tablet (200 mg total) by mouth daily. 90 tablet 3  . triamcinolone cream (KENALOG) 0.1 % Apply 1 application topically 2 (two) times daily as needed (outter ear itching & dryness).     No current facility-administered medications for this visit.   Allergies:   Ivp dye and Penicillins   Social History:  The patient  reports that he quit smoking about 40 years ago. His smoking use included Cigarettes. He has a 34 pack-year smoking history. He has never used smokeless tobacco. He reports that he drinks alcohol. He reports that he does not use illicit drugs.   Family History:  The patient's family history includes Aortic dissection in his brother; Aortic stenosis in his sister; Arthritis in his other; Colon cancer in his brother and other; Coronary artery disease in his other; Cystic fibrosis in his maternal grandfather; Depression in his other; Esophageal cancer in his sister; Hypertension in his other; Lung cancer in his other; Stroke in his other; Suicidality in his son. There is no history of Rectal cancer or Stomach cancer.   ROS:  Please see the history of present illness.   All other systems are reviewed and negative.   PHYSICAL EXAM: VS:  BP 150/90 mmHg  Pulse 66  Ht 6\' 3"  (1.905 m)  Wt 203 lb 3.2 oz (92.171 kg)  BMI 25.40 kg/m2  SpO2 96% , BMI Body mass index is 25.4 kg/(m^2). GEN: Well nourished, well developed, in no acute distress HEENT: normal Neck: no JVD, carotid bruits, or masses Cardiac: RRR; no murmurs, rubs, or gallops,no edema  Respiratory:  clear to auscultation bilaterally, normal work of  breathing GI: soft, nontender, nondistended, + BS MS: no deformity or atrophy Skin: warm and dry, no rash Neuro:  Strength and sensation are intact Psych: euthymic mood, full affect  EKG:  EKG is not ordered today. I have reviewed EKG from ER on 05/19/2014 and he chose sinus bradycardia and otherwise normal EKG.  Recent Labs: 09/16/2014: ALT 16; BUN 24*; Creatinine, Ser 0.81; Hemoglobin 16.4; Platelets 152.0; Potassium 4.4; Sodium 139; TSH 0.94   Lipid Panel    Component Value Date/Time   CHOL 179 09/16/2014 0953   TRIG 80.0 09/16/2014 0953   HDL 45.00 09/16/2014 0953   CHOLHDL  4 09/16/2014 0953   VLDL 16.0 09/16/2014 0953   LDLCALC 118* 09/16/2014 0953   Wt Readings from Last 3 Encounters:  08/11/15 203 lb 3.2 oz (92.171 kg)  02/23/15 200 lb (90.719 kg)  11/11/14 198 lb (89.812 kg)    Other studies Reviewed: Additional studies/ records that were reviewed today include: Exercise nuclear stress test from August 2013 that was normal. I reviewed his labs including electrolytes, kidney or liver function and lipid profile from May 2015 and February 2016.  Echocardiogram: 06/30/14  Left ventricle: The cavity size was normal. There was mild focal basal hypertrophy of the septum. Systolic function was normal. The estimated ejection fraction was in the range of 55% to 60%. Wall motion was normal; there were no regional wall motion abnormalities. Left ventricular diastolic function parameters were normal. - Mitral valve: Mildly thickened leaflets, no prolapse. There was trivial regurgitation. - Left atrium: Moderately dilated at 40 ml/m2. - Right ventricle: The cavity size was normal. Wall thickness was normal. The moderator band was prominent. Systolic function was normal. - Right atrium: The atrium was mildly dilated. - Tricuspid valve: There was mild regurgitation. - Pulmonic valve: The valve appears to be grossly normal. There was mild regurgitation. -  Pulmonary arteries: PA peak pressure: 30 mm Hg (S). - Inferior vena cava: The vessel was normal in size. The respirophasic diameter changes were in the normal range (>= 50%), consistent with normal central venous pressure.  Impressions:  - LVEF 55-60%, mild focal basal septal hypertrophy, normal wall motion, normal diastolic function, mild RAE, moderate LAE, trivial MR, mild TR, normal RVSP.  EKG performed today 08/11/2015 shows sinus bradycardia and otherwise is normal and unchanged from prior.   ASSESSMENT AND PLAN:  75 year old gentleman  1. Palpitations - 48 hour monitor showed infrequent PACs and PVCs, no runs, no a-fib. Considering those are not symptomatic, and he has normal LVF no further workup is recommended at this point.  2. Essential Hypertension - rechecked normal.  3. Hyperlipidemia - his LDL was 117 in 2015 however since then he lost 25 pounds, he is exercises on a regular basis and doesn't have any history of CAD we'll continue just conservative management.   4. Chest pain, retrosternal pressure-like however not at stress, considering keys last stress test was 4 years ago we will repeat an exercise stress echocardiogram.  Follow-up in one year.  Signed, Dorothy Spark, MD  08/11/2015 8:36 AM    [address], [place], [place]  [address] Phone: [phone]; Fax: [phone]

## 2015-08-22 ENCOUNTER — Other Ambulatory Visit: Payer: Self-pay | Admitting: Family Medicine

## 2015-08-24 NOTE — Telephone Encounter (Signed)
Can we refill this? 

## 2015-09-04 DIAGNOSIS — H6120 Impacted cerumen, unspecified ear: Secondary | ICD-10-CM | POA: Diagnosis not present

## 2015-09-04 DIAGNOSIS — Z6825 Body mass index (BMI) 25.0-25.9, adult: Secondary | ICD-10-CM | POA: Diagnosis not present

## 2015-09-14 DIAGNOSIS — H2513 Age-related nuclear cataract, bilateral: Secondary | ICD-10-CM | POA: Diagnosis not present

## 2015-09-15 DIAGNOSIS — L570 Actinic keratosis: Secondary | ICD-10-CM | POA: Diagnosis not present

## 2015-09-15 DIAGNOSIS — D1801 Hemangioma of skin and subcutaneous tissue: Secondary | ICD-10-CM | POA: Diagnosis not present

## 2015-09-15 DIAGNOSIS — L812 Freckles: Secondary | ICD-10-CM | POA: Diagnosis not present

## 2015-09-15 DIAGNOSIS — D2362 Other benign neoplasm of skin of left upper limb, including shoulder: Secondary | ICD-10-CM | POA: Diagnosis not present

## 2015-09-15 DIAGNOSIS — L821 Other seborrheic keratosis: Secondary | ICD-10-CM | POA: Diagnosis not present

## 2015-09-17 ENCOUNTER — Ambulatory Visit (INDEPENDENT_AMBULATORY_CARE_PROVIDER_SITE_OTHER): Payer: Medicare Other | Admitting: Family Medicine

## 2015-09-17 ENCOUNTER — Encounter: Payer: Self-pay | Admitting: Family Medicine

## 2015-09-17 VITALS — BP 163/90 | HR 64 | Temp 98.1°F | Ht 75.0 in | Wt 201.0 lb

## 2015-09-17 DIAGNOSIS — N401 Enlarged prostate with lower urinary tract symptoms: Secondary | ICD-10-CM

## 2015-09-17 DIAGNOSIS — E785 Hyperlipidemia, unspecified: Secondary | ICD-10-CM | POA: Diagnosis not present

## 2015-09-17 DIAGNOSIS — R739 Hyperglycemia, unspecified: Secondary | ICD-10-CM | POA: Diagnosis not present

## 2015-09-17 DIAGNOSIS — IMO0001 Reserved for inherently not codable concepts without codable children: Secondary | ICD-10-CM

## 2015-09-17 DIAGNOSIS — S46212A Strain of muscle, fascia and tendon of other parts of biceps, left arm, initial encounter: Secondary | ICD-10-CM

## 2015-09-17 DIAGNOSIS — N138 Other obstructive and reflux uropathy: Secondary | ICD-10-CM

## 2015-09-17 DIAGNOSIS — R030 Elevated blood-pressure reading, without diagnosis of hypertension: Secondary | ICD-10-CM

## 2015-09-17 LAB — BASIC METABOLIC PANEL
BUN: 26 mg/dL — ABNORMAL HIGH (ref 6–23)
CHLORIDE: 106 meq/L (ref 96–112)
CO2: 34 meq/L — AB (ref 19–32)
Calcium: 9.6 mg/dL (ref 8.4–10.5)
Creatinine, Ser: 0.85 mg/dL (ref 0.40–1.50)
GFR: 93.37 mL/min (ref >60.00)
Glucose, Bld: 104 mg/dL — ABNORMAL HIGH (ref 70–99)
Potassium: 4.7 mEq/L (ref 3.5–5.1)
SODIUM: 143 meq/L (ref 135–145)

## 2015-09-17 LAB — LIPID PANEL
CHOL/HDL RATIO: 4
Cholesterol: 186 mg/dL (ref 0–200)
HDL: 46.9 mg/dL (ref >39.00)
LDL Cholesterol: 117 mg/dL — ABNORMAL HIGH (ref 0–99)
NonHDL: 138.76
Triglycerides: 110 mg/dL (ref 0.0–149.0)
VLDL: 22 mg/dL (ref 0.0–40.0)

## 2015-09-17 LAB — POC URINALSYSI DIPSTICK (AUTOMATED)
BILIRUBIN UA: NEGATIVE
Glucose, UA: NEGATIVE
KETONES UA: NEGATIVE
LEUKOCYTES UA: NEGATIVE
Nitrite, UA: NEGATIVE
PROTEIN UA: NEGATIVE
Spec Grav, UA: 1.02
Urobilinogen, UA: 1
pH, UA: 7

## 2015-09-17 LAB — CBC WITH DIFFERENTIAL/PLATELET
Basophils Absolute: 0 10*3/uL (ref 0.0–0.1)
Basophils Relative: 0.5 % (ref 0.0–3.0)
EOS PCT: 5.7 % — AB (ref 0.0–5.0)
Eosinophils Absolute: 0.2 10*3/uL (ref 0.0–0.7)
HCT: 49.4 % (ref 39.0–52.0)
Hemoglobin: 17 g/dL (ref 13.0–17.0)
LYMPHS ABS: 0.9 10*3/uL (ref 0.7–4.0)
Lymphocytes Relative: 20.5 % (ref 12.0–46.0)
MCHC: 34.5 g/dL (ref 30.0–36.0)
MCV: 93.6 fl (ref 78.0–100.0)
MONOS PCT: 10.5 % (ref 3.0–12.0)
Monocytes Absolute: 0.4 10*3/uL (ref 0.1–1.0)
NEUTROS ABS: 2.6 10*3/uL (ref 1.4–7.7)
NEUTROS PCT: 62.8 % (ref 43.0–77.0)
Platelets: 141 10*3/uL — ABNORMAL LOW (ref 150.0–400.0)
RBC: 5.28 Mil/uL (ref 4.22–5.81)
RDW: 12.6 % (ref 11.5–15.5)
WBC: 4.2 10*3/uL (ref 4.0–10.5)

## 2015-09-17 LAB — HEPATIC FUNCTION PANEL
ALK PHOS: 71 U/L (ref 39–117)
ALT: 16 U/L (ref 0–53)
AST: 19 U/L (ref 0–37)
Albumin: 4.6 g/dL (ref 3.5–5.2)
BILIRUBIN DIRECT: 0.2 mg/dL (ref 0.0–0.3)
BILIRUBIN TOTAL: 0.9 mg/dL (ref 0.2–1.2)
Total Protein: 7 g/dL (ref 6.0–8.3)

## 2015-09-17 LAB — TSH: TSH: 0.86 u[IU]/mL (ref 0.35–4.50)

## 2015-09-17 LAB — PSA: PSA: 1.17 ng/mL (ref 0.10–4.00)

## 2015-09-17 LAB — HEMOGLOBIN A1C: Hgb A1c MFr Bld: 5.2 % (ref 4.6–6.5)

## 2015-09-17 MED ORDER — METAXALONE 400 MG PO TABS: 400.0000 mg | ORAL_TABLET | Freq: Three times a day (TID) | ORAL | Status: DC | PRN

## 2015-09-17 MED ORDER — TAMSULOSIN HCL 0.4 MG PO CAPS: 0.4000 mg | ORAL_CAPSULE | Freq: Every day | ORAL | Status: DC

## 2015-09-17 MED ORDER — TRIAMCINOLONE ACETONIDE 0.1 % EX CREA: 1.0000 "application " | TOPICAL_CREAM | Freq: Two times a day (BID) | CUTANEOUS | Status: DC | PRN

## 2015-09-17 NOTE — Progress Notes (Signed)
Subjective:    Patient ID: Alex Weiss, male    DOB: Sep 16, 1940, 75 y.o.   MRN: [id number]  HPI 75 yr old male to follow up on several problems. His BP has been stable. He feels good and stays active. He still gets a wellness exam at the [place] yearly. He has had more trouble lately with slow urinary stream and frequent urinations. No discomfort. He has known BPH. He notes that he injured his left arm in December when e was hanging off the edge of a large dumpster trying to retrieve something. He had pain and swelling for a few weeks but this went away. Now the arm does not bother him at all but it looks different. Also he wants to be evaluated for sleep apnea. His wife says he snores loudly and he often stops breathing in his sleep.    Review of Systems  Constitutional: Negative.   HENT: Negative.   Eyes: Negative.   Respiratory: Negative.   Cardiovascular: Negative.   Gastrointestinal: Negative.   Genitourinary: Positive for frequency and difficulty urinating. Negative for dysuria, urgency, hematuria, flank pain, decreased urine volume, discharge, penile swelling, scrotal swelling, enuresis, genital sores, penile pain and testicular pain.  Musculoskeletal: Negative.   Skin: Negative.   Neurological: Negative.   Psychiatric/Behavioral: Negative.        Objective:   Physical Exam  Constitutional: He is oriented to person, place, and time. He appears well-developed and well-nourished. No distress.  HENT:  Head: Normocephalic and atraumatic.  Right Ear: External ear normal.  Left Ear: External ear normal.  Nose: Nose normal.  Mouth/Throat: Oropharynx is clear and moist. No oropharyngeal exudate.  Eyes: Conjunctivae and EOM are normal. Pupils are equal, round, and reactive to light. Right eye exhibits no discharge. Left eye exhibits no discharge. No scleral icterus.  Neck: Neck supple. No JVD present. No tracheal deviation present. No thyromegaly present.  Cardiovascular: Normal rate,  regular rhythm, normal heart sounds and intact distal pulses.  Exam reveals no gallop and no friction rub.   No murmur heard. Pulmonary/Chest: Effort normal and breath sounds normal. No respiratory distress. He has no wheezes. He has no rales. He exhibits no tenderness.  Abdominal: Soft. Bowel sounds are normal. He exhibits no distension and no mass. There is no tenderness. There is no rebound and no guarding.  Genitourinary: Rectum normal and penis normal. Guaiac negative stool. No penile tenderness.  Prostate is firm and moderately enlarged. No nodules or tenderness  Musculoskeletal: Normal range of motion. He exhibits no edema or tenderness.  The left biceps muscle is half the expected size and there is a sharp step off at the mid portion. No tenderness, full ROM, full strength against resistance   Lymphadenopathy:    He has no cervical adenopathy.  Neurological: He is alert and oriented to person, place, and time. He has normal reflexes. No cranial nerve deficit. He exhibits normal muscle tone. Coordination normal.  Skin: Skin is warm and dry. No rash noted. He is not diaphoretic. No erythema. No pallor.  Psychiatric: He has a normal mood and affect. His behavior is normal. Judgment and thought content normal.          Assessment & Plan:  His BP is stable. Get fasting labs to check his glucose and an A1c. Check a PSA. He has BPH symptoms so he will try Flomax 0.4 mg daily. Refer for a sleep study for possible sleep apnea. It appears that he ruptured one of  the left biceps tendons last December. This has healed since then and there is nothing really to do about it at this point.  Laurey Morale, MD

## 2015-09-17 NOTE — Progress Notes (Signed)
Pre visit review using our clinic review tool, if applicable. No additional management support is needed unless otherwise documented below in the visit note. 

## 2015-09-21 ENCOUNTER — Ambulatory Visit (HOSPITAL_COMMUNITY): Payer: Medicare Other

## 2015-09-21 ENCOUNTER — Other Ambulatory Visit (HOSPITAL_COMMUNITY): Payer: Medicare Other

## 2015-10-11 ENCOUNTER — Encounter: Payer: Self-pay | Admitting: Family Medicine

## 2015-10-11 DIAGNOSIS — G4733 Obstructive sleep apnea (adult) (pediatric): Secondary | ICD-10-CM

## 2015-10-11 DIAGNOSIS — M25562 Pain in left knee: Secondary | ICD-10-CM

## 2015-10-14 ENCOUNTER — Ambulatory Visit (HOSPITAL_COMMUNITY): Payer: Medicare Other | Attending: Cardiology

## 2015-10-14 ENCOUNTER — Ambulatory Visit (HOSPITAL_BASED_OUTPATIENT_CLINIC_OR_DEPARTMENT_OTHER): Payer: Medicare Other

## 2015-10-14 DIAGNOSIS — I491 Atrial premature depolarization: Secondary | ICD-10-CM | POA: Insufficient documentation

## 2015-10-14 DIAGNOSIS — I10 Essential (primary) hypertension: Secondary | ICD-10-CM | POA: Insufficient documentation

## 2015-10-14 DIAGNOSIS — I493 Ventricular premature depolarization: Secondary | ICD-10-CM | POA: Diagnosis not present

## 2015-10-14 DIAGNOSIS — R0989 Other specified symptoms and signs involving the circulatory and respiratory systems: Secondary | ICD-10-CM

## 2015-10-14 DIAGNOSIS — R072 Precordial pain: Secondary | ICD-10-CM

## 2015-10-15 ENCOUNTER — Telehealth: Payer: Self-pay | Admitting: *Deleted

## 2015-10-15 MED ORDER — LOSARTAN POTASSIUM 25 MG PO TABS: 25.0000 mg | ORAL_TABLET | Freq: Every day | ORAL | Status: DC

## 2015-10-15 NOTE — Telephone Encounter (Signed)
-----   Message from Dorothy Spark, MD sent at 10/14/2015  5:52 PM EDT ----- Normal stress test with no ischemia and excellent exercise capacity, however severe hypertension. I would start losartan 25 mg po daily.

## 2015-10-15 NOTE — Telephone Encounter (Signed)
Notified the pt that per Dr Meda Coffee, his stress test was normal with no ischemia and excellent exercise capacity, however, he did have severe HTN.  Informed the pt that per Dr Meda Coffee, she recommends that we start him on losartan 25 mg po daily for this.  Confirmed the pharmacy of choice with the pt.  Pt request just a month supply to be sent to [facility], and then he will call our office for further refills and switch to [facility].  Pt verbalized understanding and agrees with this plan.

## 2015-10-20 NOTE — Telephone Encounter (Signed)
I referred him to Pulmonary for sleep apnea and to Dr. Dorna Leitz for knee pain

## 2015-10-29 DIAGNOSIS — S46112A Strain of muscle, fascia and tendon of long head of biceps, left arm, initial encounter: Secondary | ICD-10-CM | POA: Diagnosis not present

## 2015-10-29 DIAGNOSIS — M2242 Chondromalacia patellae, left knee: Secondary | ICD-10-CM | POA: Diagnosis not present

## 2015-10-29 DIAGNOSIS — M25562 Pain in left knee: Secondary | ICD-10-CM | POA: Diagnosis not present

## 2015-11-09 ENCOUNTER — Other Ambulatory Visit: Payer: Self-pay | Admitting: *Deleted

## 2015-11-09 ENCOUNTER — Encounter: Payer: Self-pay | Admitting: Family Medicine

## 2015-11-09 MED ORDER — LOSARTAN POTASSIUM 25 MG PO TABS: 25.0000 mg | ORAL_TABLET | Freq: Every day | ORAL | 3 refills | Status: DC

## 2015-11-10 MED ORDER — TAMSULOSIN HCL 0.4 MG PO CAPS: 0.4000 mg | ORAL_CAPSULE | Freq: Every day | ORAL | 3 refills | Status: DC

## 2015-12-25 ENCOUNTER — Institutional Professional Consult (permissible substitution): Payer: Medicare Other | Admitting: Pulmonary Disease

## 2016-01-05 ENCOUNTER — Encounter: Payer: Self-pay | Admitting: Pulmonary Disease

## 2016-01-05 ENCOUNTER — Ambulatory Visit (INDEPENDENT_AMBULATORY_CARE_PROVIDER_SITE_OTHER): Payer: Medicare Other | Admitting: Pulmonary Disease

## 2016-01-05 VITALS — BP 134/78 | HR 76 | Ht 75.0 in | Wt 208.8 lb

## 2016-01-05 DIAGNOSIS — G4733 Obstructive sleep apnea (adult) (pediatric): Secondary | ICD-10-CM | POA: Insufficient documentation

## 2016-01-05 DIAGNOSIS — R0683 Snoring: Secondary | ICD-10-CM

## 2016-01-05 DIAGNOSIS — Z23 Encounter for immunization: Secondary | ICD-10-CM | POA: Diagnosis not present

## 2016-01-05 NOTE — Assessment & Plan Note (Signed)
Given excessive daytime somnolence, narrow pharyngeal exam, witnessed apneas & loud snoring, obstructive sleep apnea is very likely & an overnight polysomnogram will be scheduled as a home study. The pathophysiology of obstructive sleep apnea , it's cardiovascular consequences & modes of treatment including CPAP were discused with the patient in detail & they evidenced understanding.  Pretest probability is intermediate. Witnessed apneas by wife is a red flag

## 2016-01-05 NOTE — Patient Instructions (Signed)
Home sleep study 

## 2016-01-05 NOTE — Progress Notes (Signed)
Subjective:    Patient ID: Alex Weiss, male    DOB: Oct 14, 1940, 75 y.o.   MRN: [id number]  HPI  Chief Complaint  Patient presents with  . Sleep Consult    Referred by Dr. [name]; snoring; witnessed apneas; ES: 89     75 year old hypertensive referred for evaluation of sleep-disordered breathing He is a retired [employer]. He reports a history of tracheostomy at 1.5 years due to strep throat   His wife Alex Weiss reports snoring for many years, but over the past year she has witnessed apneas where his snoring seems to stop for a few seconds during sleep. Epworth sleepiness score is 11 and he reports sleepiness while sitting and reading, watching TV or sometimes in the afternoons when he will nap in the recliner. He denies any problems driving  Bedtime is around 11 PM, sleep latency is minimal, he sleeps on his right with one pillow and will occasionally turn over on his back. Snoring is worse on his back. His legs ache and he keeps a pillow between his legs. He reports 2-3 nocturnal awakenings for bathroom visits, this is decreased with Flomax. He is out of bed by 7 AM feeling rested without dryness of mouth or headaches. He is lost about 20 pounds over the last 2 years He drinks about 2 cups of coffee in the morning.   There is no history suggestive of cataplexy, sleep paralysis or parasomnias      Past Medical History:  Diagnosis Date  . Allergy   . Arthritis   . BPH (benign prostatic hypertrophy)    sees Dr. Irine Seal   . Cataract    starting   . FH: colonic polyps   . GERD (gastroesophageal reflux disease)   . Hematuria, microscopic   . History of nephrolithiasis    1962  . History of prostatitis   . History of ulcerative colitis   . Premature atrial contraction   . PVC's (premature ventricular contractions)   . Renal calculus, left    NON-OBSTRUCTING  . Wears glasses    Past Surgical History:  Procedure Laterality Date  . ANTERIOR CERVICAL  DECOMP/DISCECTOMY FUSION  1996   C3 --  C5  . BACK SURGERY    . CERVICAL LAMINECTOMY  02-04-2004   C7 -- T2  . COLONOSCOPY  11-11-14   per Dr. Ardis Hughs, no polyps, repeat in 5 yrs  . CYSTOSCOPY WITH BIOPSY N/A 09/05/2012   Procedure: CYSTOSCOPY WITH BIOPSY;  Surgeon: Molli Hazard, MD;  Location: [facility];  Service: Urology;  Laterality: N/A;  . LEFT QUAD TENDON REPAIR  2007  . TONSILLECTOMY  AGE 24  . TRACHEOSTOMY  AGE 10 MONTHS OLD   STREP THROAT  . WRIST GANGLION EXCISION Right 2003      Allergies  Allergen Reactions  . Ivp Dye [Iodinated Diagnostic Agents] Hives  . Penicillins Hives     Social History   Social History  . Marital status: Married    Spouse name: N/A  . Number of children: N/A  . Years of education: N/A   Occupational History  . Not on file.   Social History Main Topics  . Smoking status: Former Smoker    Packs/day: 2.00    Years: 17.00    Types: Cigarettes    Quit date: 04/05/1975  . Smokeless tobacco: Never Used  . Alcohol use 0.0 oz/week     Comment: wine daily, occ beer  . Drug use: No  .  Sexual activity: Not on file   Other Topics Concern  . Not on file   Social History Narrative   ** Merged History Encounter **         Family History  Problem Relation Age of Onset  . Arthritis Other   . Coronary artery disease Other   . Depression Other   . Hypertension Other   . Lung cancer Other   . Stroke Other   . Aortic dissection Brother   . Colon cancer Brother   . Aortic stenosis Sister   . Esophageal cancer Sister     dx age 44  . Cystic fibrosis Maternal Grandfather   . Rectal cancer Neg Hx   . Stomach cancer Neg Hx   . Colon cancer Other   . Suicidality Son      Review of Systems  Constitutional: Negative for activity change, appetite change, chills, fever and unexpected weight change.  HENT: Negative for congestion, dental problem, postnasal drip, rhinorrhea, sneezing, sore throat, trouble swallowing  and voice change.   Eyes: Negative for visual disturbance.  Respiratory: Negative for cough, choking and shortness of breath.   Cardiovascular: Negative for chest pain and leg swelling.  Gastrointestinal: Negative for abdominal pain, nausea and vomiting.  Genitourinary: Negative for difficulty urinating.  Musculoskeletal: Negative for arthralgias.  Skin: Negative for rash.  Psychiatric/Behavioral: Negative for behavioral problems and confusion.       Objective:   Physical Exam  Gen. Pleasant, well-nourished, tall, in no distress, normal affect ENT - no lesions, no post nasal drip Neck: No JVD, no thyromegaly, no carotid bruits Lungs: no use of accessory muscles, no dullness to percussion, clear without rales or rhonchi  Cardiovascular: Rhythm regular, heart sounds  normal, no murmurs or gallops, no peripheral edema Abdomen: soft and non-tender, no hepatosplenomegaly, BS normal. Musculoskeletal: No deformities, no cyanosis or clubbing Neuro:  alert, non focal       Assessment & Plan:

## 2016-01-23 DIAGNOSIS — G4733 Obstructive sleep apnea (adult) (pediatric): Secondary | ICD-10-CM | POA: Diagnosis not present

## 2016-01-26 ENCOUNTER — Telehealth: Payer: Self-pay | Admitting: Pulmonary Disease

## 2016-01-26 DIAGNOSIS — G4733 Obstructive sleep apnea (adult) (pediatric): Secondary | ICD-10-CM

## 2016-01-26 NOTE — Telephone Encounter (Signed)
Per RA-  Sleep apnea is mild overall with 13 events per hour, severe on his back with 33 events per hour.  Suggests cpap titration study.  lmtcb X1 for pt to make aware of results.  Will schedule titration study after speaking to patient.   Forwarding to [place] to follow up on.

## 2016-01-27 ENCOUNTER — Other Ambulatory Visit: Payer: Self-pay | Admitting: *Deleted

## 2016-01-27 DIAGNOSIS — G4733 Obstructive sleep apnea (adult) (pediatric): Secondary | ICD-10-CM

## 2016-02-01 NOTE — Telephone Encounter (Signed)
LMTC x 1  

## 2016-02-05 NOTE — Telephone Encounter (Signed)
lmtcb for pt.  

## 2016-02-08 NOTE — Telephone Encounter (Signed)
Patient returning call - He can be reached at [phone]

## 2016-02-08 NOTE — Telephone Encounter (Signed)
Spoke with patient-he is aware of sleep apnea results; aware and agrees to CPAP titration study. Order has been placed and pt aware PCC's will contact him to confirm date,time, and location of study.

## 2016-03-10 ENCOUNTER — Other Ambulatory Visit: Payer: Self-pay

## 2016-03-14 DIAGNOSIS — R3915 Urgency of urination: Secondary | ICD-10-CM | POA: Diagnosis not present

## 2016-03-14 DIAGNOSIS — N200 Calculus of kidney: Secondary | ICD-10-CM | POA: Diagnosis not present

## 2016-03-14 DIAGNOSIS — N401 Enlarged prostate with lower urinary tract symptoms: Secondary | ICD-10-CM | POA: Diagnosis not present

## 2016-03-30 ENCOUNTER — Encounter (HOSPITAL_BASED_OUTPATIENT_CLINIC_OR_DEPARTMENT_OTHER): Payer: Medicare Other

## 2016-04-07 IMAGING — CR DG CHEST 1V PORT
2 series · 2 of 2 positions shown · non-contrast
Comparison: CT abdomen including lung bases March 17, 2014

CLINICAL DATA: Difficulty breathing with fluttering sensation in
chest

EXAM:
PORTABLE CHEST - 1 VIEW

[view not recorded (1 of 2)]
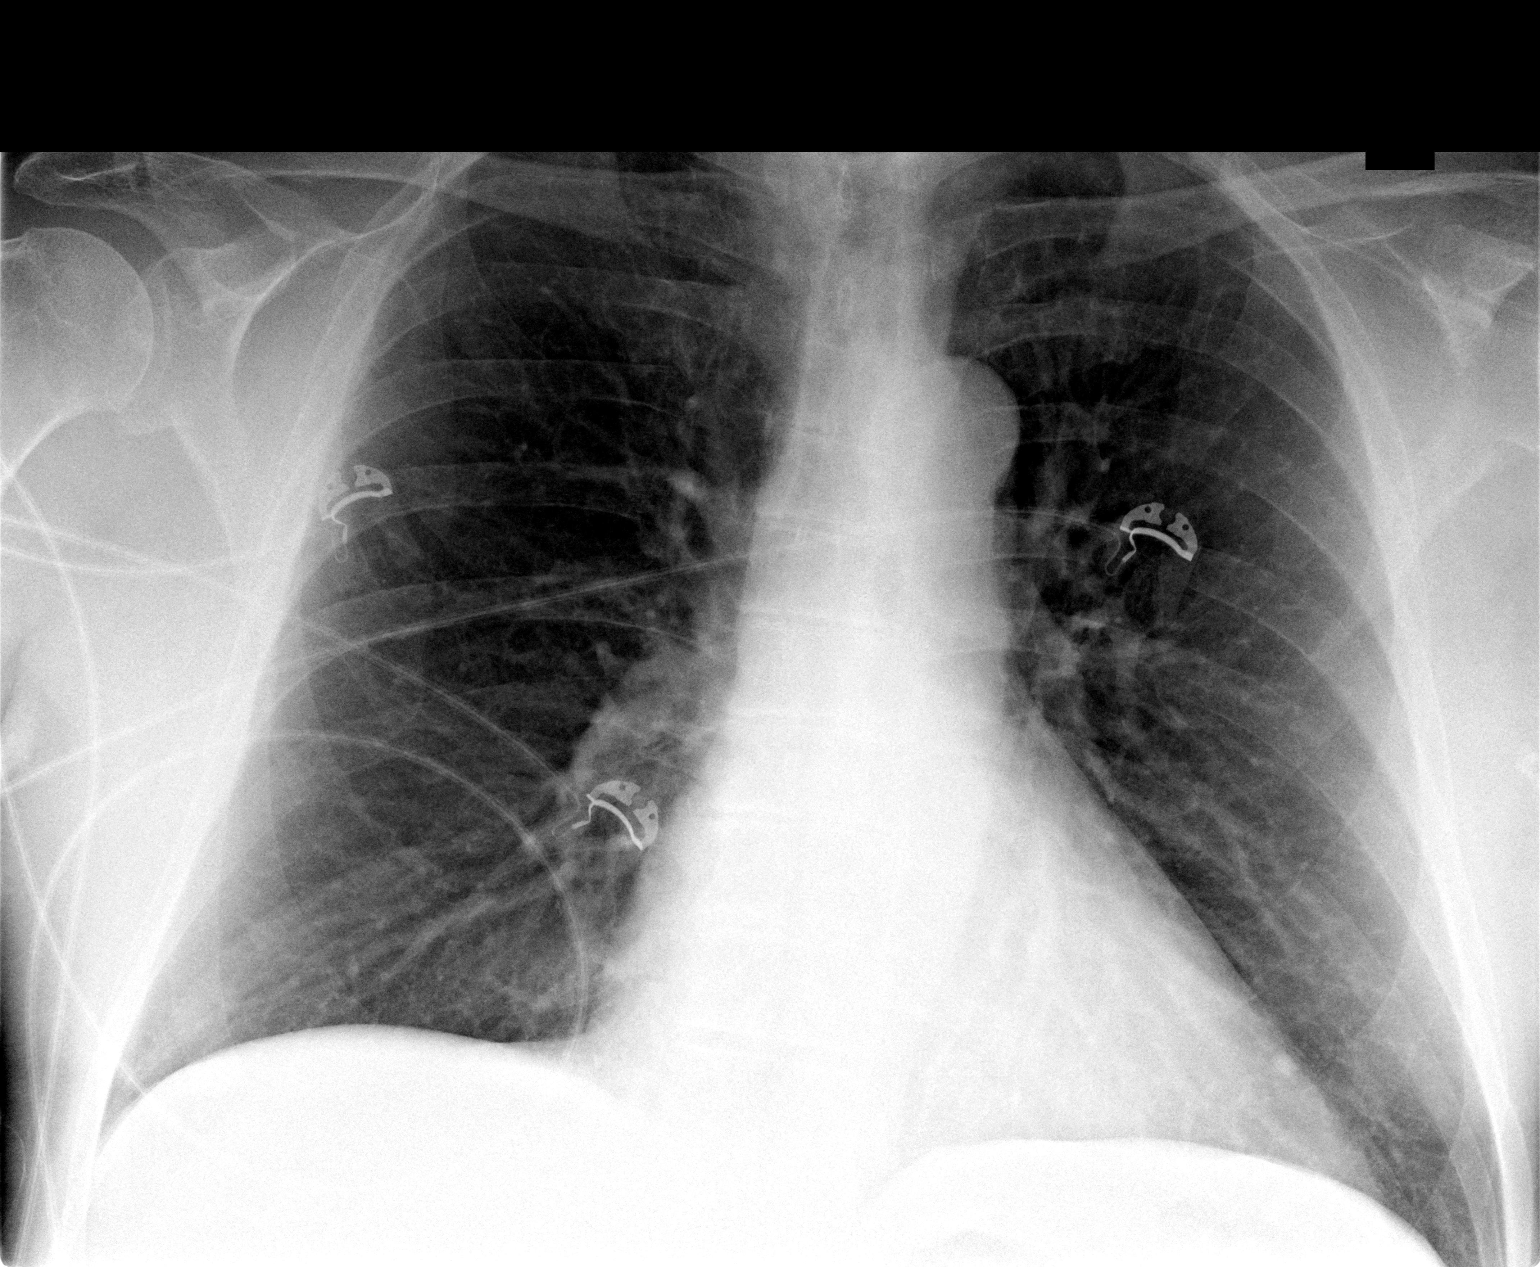

[view not recorded (2 of 2)]
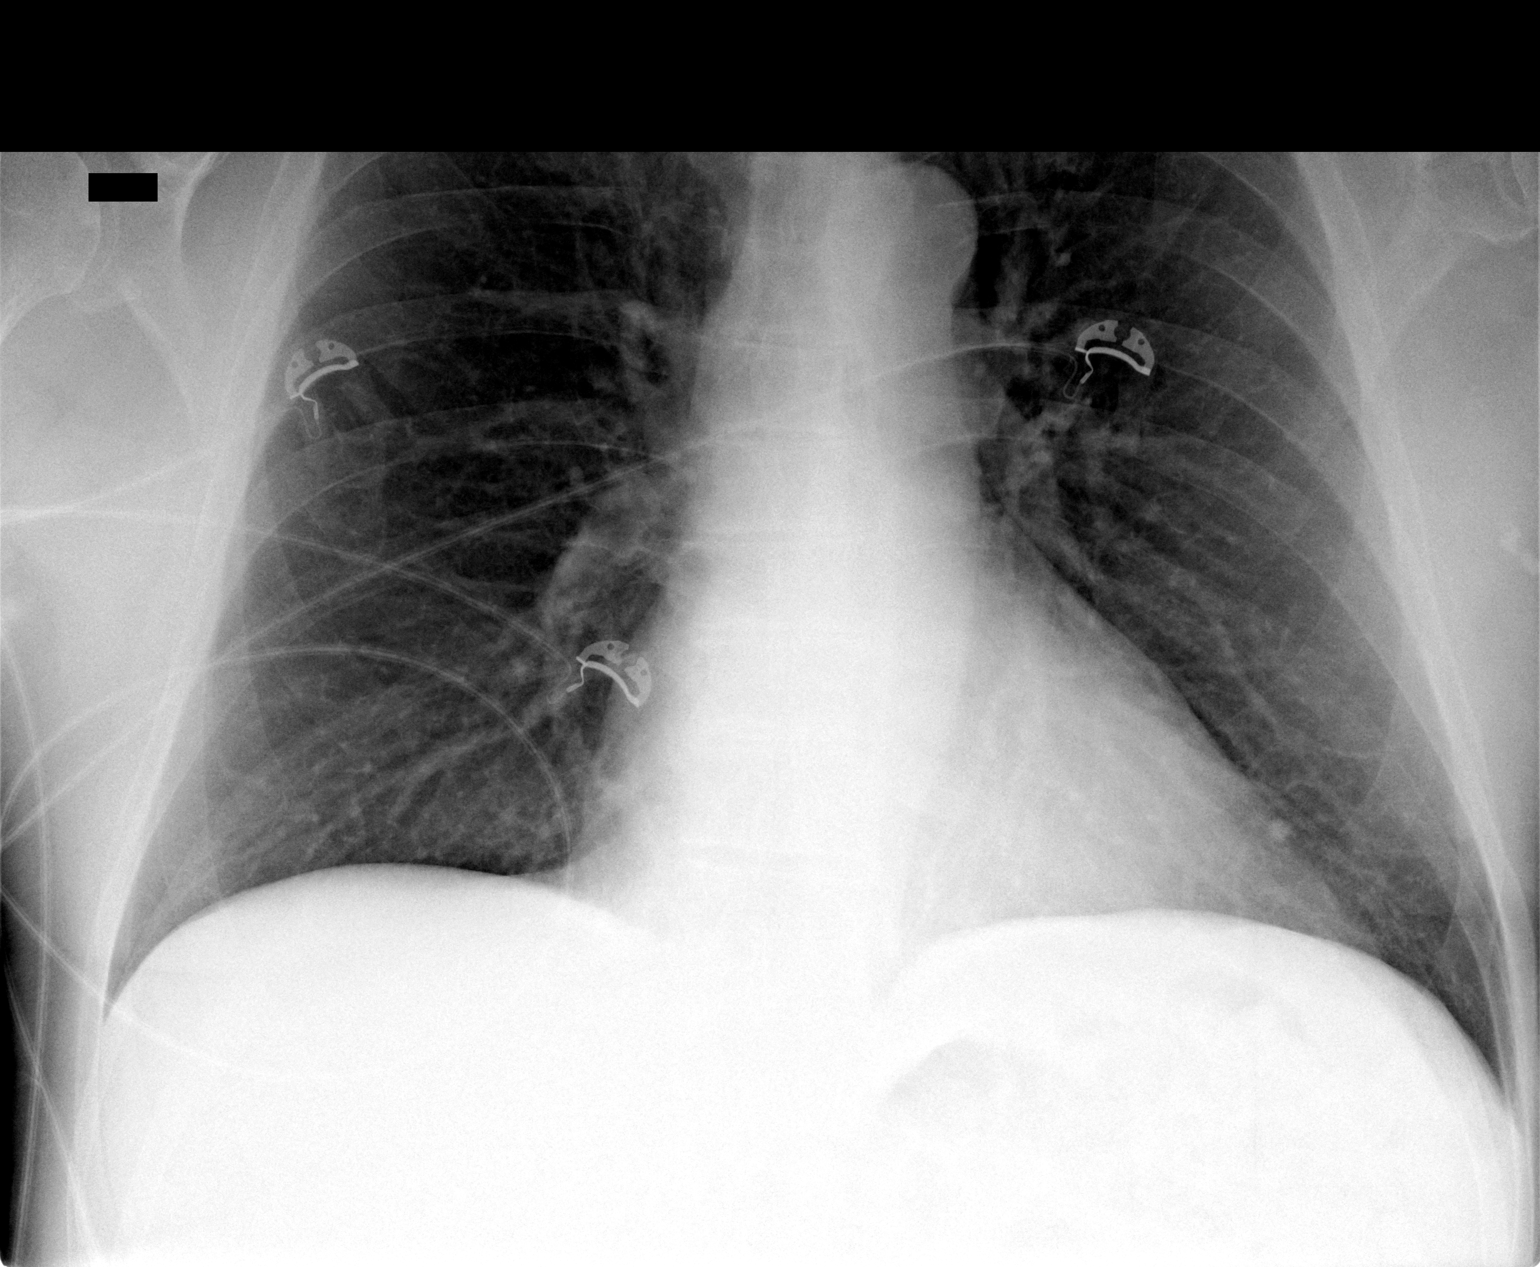

[2 of 2 positions shown; findings below may reference images not displayed]

FINDINGS: Currently lungs are clear. The ground-glass type opacity seen in the
right base on recent chest CT is not appreciable on radiographic
examination. Heart size and pulmonary vascularity are normal. No
adenopathy. No bone lesions.
IMPRESSION: No edema or consolidation. The ground-glass type opacity seen in the
right base on recent chest CT is not appreciable by radiography. As
CT is more sensitive for such opacities than is radiography, the
followup chest CT recommendation made in the report of the prior CT
examination remains in effect.

## 2016-04-11 ENCOUNTER — Ambulatory Visit (HOSPITAL_BASED_OUTPATIENT_CLINIC_OR_DEPARTMENT_OTHER): Payer: Medicare Other | Attending: Pulmonary Disease | Admitting: Pulmonary Disease

## 2016-04-11 DIAGNOSIS — G4733 Obstructive sleep apnea (adult) (pediatric): Secondary | ICD-10-CM | POA: Insufficient documentation

## 2016-04-13 ENCOUNTER — Telehealth: Payer: Self-pay | Admitting: Pulmonary Disease

## 2016-04-13 NOTE — Procedures (Signed)
Patient Name: Alex Weiss, Eye Date: 04/11/2016 Gender: Male D.O.B: 1940/08/30 Age (years): 11 Referring Provider: Kara Mead MD, ABSM Height (inches): 75 Interpreting Physician: Kara Mead MD, ABSM Weight (lbs): 203 RPSGT: Madelon Lips BMI: 25 MRN: [id number] Neck Size: 16.00   CLINICAL INFORMATION The patient is referred for a CPAP titration to treat sleep apnea.  Date of  HST: 01/2016 .mild overall with AHI 13 events per hour, severe on his back with 33 events per hour  SLEEP STUDY TECHNIQUE As per the AASM Manual for the Scoring of Sleep and Associated Events v2.3 (April 2016) with a hypopnea requiring 4% desaturations.  The channels recorded and monitored were frontal, central and occipital EEG, electrooculogram (EOG), submentalis EMG (chin), nasal and oral airflow, thoracic and abdominal wall motion, anterior tibialis EMG, snore microphone, electrocardiogram, and pulse oximetry. Continuous positive airway pressure (CPAP) was initiated at the beginning of the study and titrated to treat sleep-disordered breathing.  MEDICATIONS Medications self-administered by patient taken the night of the study : N/A  RESPIRATORY PARAMETERS Optimal PAP Pressure (cm): 8 AHI at Optimal Pressure (/hr): 0.0 Overall Minimal O2 (%): 87.00 Supine % at Optimal Pressure (%): 27 Minimal O2 at Optimal Pressure (%): 91.0     SLEEP ARCHITECTURE The study was initiated at 10:21:00 PM and ended at 4:37:11 AM.  Sleep onset time was 8.0 minutes and the sleep efficiency was 62.1%. The total sleep time was 233.5 minutes.  The patient spent 34.26% of the night in stage N1 sleep, 53.75% in stage N2 sleep, 0.00% in stage N3 and 11.99% in REM.Stage REM latency was 260.0 minutes  Wake after sleep onset was 134.7. Alpha intrusion was absent. Supine sleep was 53.10%.  CARDIAC DATA The 2 lead EKG demonstrated sinus rhythm. The mean heart rate was 55.98 beats per minute. Other EKG findings include: None. LEG  MOVEMENT DATA The total Periodic Limb Movements of Sleep (PLMS) were 0. The PLMS index was 0.00. A PLMS index of <15 is considered normal in adults.  IMPRESSIONS - The optimal PAP pressure was 8 cm of water. - Central sleep apnea was not noted during this titration (CAI = 0.0/h). - Moderate oxygen desaturations were observed during this titration (min O2 = 87.00%). - The patient snored with Soft snoring volume during this titration study. - No cardiac abnormalities were observed during this study. - Clinically significant periodic limb movements were not noted during this study. Arousals associated with PLMs were rare.   DIAGNOSIS - Obstructive Sleep Apnea (327.23 [G47.33 ICD-10])   RECOMMENDATIONS - Trial of CPAP therapy on 8 cm H2O with a Medium size Resmed Full Face Mask AirFit F20 mask and heated humidification. - Avoid alcohol, sedatives and other CNS depressants that may worsen sleep apnea and disrupt normal sleep architecture. - Sleep hygiene should be reviewed to assess factors that may improve sleep quality. - Weight management and regular exercise should be initiated or continued. - Return to [facility] for re-evaluation after 4 weeks of therapy   Kara Mead MD Board Certified in [place]

## 2016-04-13 NOTE — Telephone Encounter (Signed)
Pl inform pt & send Rx to DME   CPAP therapy on 8 cm H2O with a Medium size Resmed Full Face Mask AirFit F20 mask and heated humidification. DL in 4 wks OV in 6wks with tp/ me

## 2016-04-15 ENCOUNTER — Other Ambulatory Visit: Payer: Self-pay

## 2016-04-15 DIAGNOSIS — G4733 Obstructive sleep apnea (adult) (pediatric): Secondary | ICD-10-CM

## 2016-04-15 NOTE — Telephone Encounter (Signed)
Attempted to call patient about scheduling an appt but patient did not answer. Was not able to leave an msg. Will attempt to call back later.

## 2016-04-28 NOTE — Telephone Encounter (Signed)
Alex Weiss returned Alex Weiss's call Alex Weiss stated that he would not like to outright start CPAP therapy, instead he would like to come in for ov to discuss results in detail.  Did ask Alex Weiss if okay to schedule with NP, he declined this service and would only like to see RA.  Appt scheduled for 2.23.18 @ 1130 - Alex Weiss is aware he may call to check for sooner appts.  Nothing further needed; will sign off.

## 2016-05-14 DIAGNOSIS — J069 Acute upper respiratory infection, unspecified: Secondary | ICD-10-CM | POA: Diagnosis not present

## 2016-05-14 DIAGNOSIS — J040 Acute laryngitis: Secondary | ICD-10-CM | POA: Diagnosis not present

## 2016-05-17 ENCOUNTER — Ambulatory Visit (INDEPENDENT_AMBULATORY_CARE_PROVIDER_SITE_OTHER): Payer: Medicare Other | Admitting: Family Medicine

## 2016-05-17 ENCOUNTER — Encounter: Payer: Self-pay | Admitting: Family Medicine

## 2016-05-17 VITALS — BP 154/89 | HR 65 | Temp 98.2°F | Ht 75.0 in | Wt 213.0 lb

## 2016-05-17 DIAGNOSIS — J209 Acute bronchitis, unspecified: Secondary | ICD-10-CM

## 2016-05-17 MED ORDER — LEVOFLOXACIN 500 MG PO TABS: 500.0000 mg | ORAL_TABLET | Freq: Every day | ORAL | 0 refills | Status: DC

## 2016-05-17 MED ORDER — METHYLPREDNISOLONE 4 MG PO TBPK: ORAL_TABLET | ORAL | 0 refills | Status: DC

## 2016-05-17 NOTE — Progress Notes (Signed)
Pre visit review using our clinic review tool, if applicable. No additional management support is needed unless otherwise documented below in the visit note. 

## 2016-05-17 NOTE — Progress Notes (Signed)
   Subjective:    Patient ID: Dolores Patty, male    DOB: 04-14-1940, 76 y.o.   MRN: [id number]  HPI Here to follow up an URI that started last weekend. He was in [facility] and developed a cough and chest congestion. The cough produces grayish sputum. No SOB or fever. He was given a steroid shot and was started on a Zpack. He is also taking Mucinex DM. However he is no better at this point. Taking fluids.    Review of Systems  Constitutional: Negative.   HENT: Negative.   Eyes: Negative.   Respiratory: Positive for cough and chest tightness. Negative for shortness of breath and wheezing.   Cardiovascular: Negative.        Objective:   Physical Exam  Constitutional: He appears well-developed and well-nourished.  HENT:  Right Ear: External ear normal.  Left Ear: External ear normal.  Nose: Nose normal.  Mouth/Throat: Oropharynx is clear and moist.  Eyes: Conjunctivae are normal.  Neck: No thyromegaly present.  Pulmonary/Chest: Effort normal. He has no rales.  Diffuse rhonchi and wheezes           Assessment & Plan:  Partially treated bronchitis. Stop the Zpack and switch to Levaquin for 10 days. Add a Medrol dose pack. Recheck prn.  Alysia Penna, MD

## 2016-05-27 ENCOUNTER — Ambulatory Visit (INDEPENDENT_AMBULATORY_CARE_PROVIDER_SITE_OTHER): Payer: Medicare Other | Admitting: Pulmonary Disease

## 2016-05-27 ENCOUNTER — Encounter: Payer: Self-pay | Admitting: Pulmonary Disease

## 2016-05-27 DIAGNOSIS — G4733 Obstructive sleep apnea (adult) (pediatric): Secondary | ICD-10-CM

## 2016-05-27 NOTE — Assessment & Plan Note (Signed)
Controlled today 

## 2016-05-27 NOTE — Patient Instructions (Signed)
You have positional obstructive sleep apnea that is worse in supine position  We discussed methods to avoid sleeping on her back Check out [email or web address]  for device  If this does not work, call us back in 3 months for CPAP prescription with nasal pillows

## 2016-05-27 NOTE — Assessment & Plan Note (Addendum)
positional obstructive sleep apnea that is worse in supine position  We discussed methods to avoid sleeping on her back Check out [email or web address]  for device  If this does not work, call us back in 3 months for CPAP prescription with nasal pillows  We also discussed dental appliance and reviewed both his sleep studies  Greater than 50% time was spent in counseling and coordination of care with the patient

## 2016-05-27 NOTE — Progress Notes (Signed)
   Subjective:    Patient ID: Dolores Patty, male    DOB: 20-Jul-1940, 76 y.o.   MRN: [id number]  HPI  76 year old hypertensive for FU of sleep-disordered breathing He is a retired [employer] fast [employer]. He reports a history of tracheostomy at 1.5 years due to strep throat   His wife continues to complain of loud snoring and extreme fatigue and frequent daytime naps  We discussed his sleep studies today Prescription was sent for CPAP but he wanted to discuss alternative therapies before starting on this Significant tests/ events  HST 01/2016 mild overall with 13 events per hour, severe on his back with 33 events per hour  04/2016 CPAP 8 cm   Review of Systems Patient denies significant dyspnea,cough, hemoptysis,  chest pain, palpitations, pedal edema, orthopnea, paroxysmal nocturnal dyspnea, lightheadedness, nausea, vomiting, abdominal or  leg pains      Objective:   Physical Exam   Gen. Pleasant, well-nourished, in no distress ENT - no lesions, no post nasal drip Neck: No JVD, no thyromegaly, no carotid bruits Lungs: no use of accessory muscles, no dullness to percussion, clear without rales or rhonchi  Cardiovascular: Rhythm regular, heart sounds  normal, no murmurs or gallops, no peripheral edema Musculoskeletal: No deformities, no cyanosis or clubbing         Assessment & Plan:

## 2016-07-12 ENCOUNTER — Telehealth: Payer: Self-pay | Admitting: Pulmonary Disease

## 2016-07-12 DIAGNOSIS — G4733 Obstructive sleep apnea (adult) (pediatric): Secondary | ICD-10-CM

## 2016-07-12 NOTE — Telephone Encounter (Signed)
Pt states he wishes to proceed with cpap therapy.  Pt requests order for nasal pillows, not a full face mask.   RA please advise on recs for order.  Thanks!

## 2016-07-13 NOTE — Telephone Encounter (Signed)
Rx has been sent in. 

## 2016-07-13 NOTE — Telephone Encounter (Signed)
Please send prescription for auto CPAP 5-12 cm, nasal pillows Download and follow-up in 6 weeks

## 2016-08-18 ENCOUNTER — Other Ambulatory Visit: Payer: Self-pay | Admitting: Family Medicine

## 2016-09-19 ENCOUNTER — Encounter: Payer: Self-pay | Admitting: Family Medicine

## 2016-09-19 ENCOUNTER — Ambulatory Visit (INDEPENDENT_AMBULATORY_CARE_PROVIDER_SITE_OTHER): Payer: Medicare Other | Admitting: Family Medicine

## 2016-09-19 VITALS — BP 132/84 | HR 66 | Temp 98.2°F | Ht 75.0 in | Wt 206.0 lb

## 2016-09-19 DIAGNOSIS — I10 Essential (primary) hypertension: Secondary | ICD-10-CM

## 2016-09-19 DIAGNOSIS — R739 Hyperglycemia, unspecified: Secondary | ICD-10-CM

## 2016-09-19 DIAGNOSIS — G4733 Obstructive sleep apnea (adult) (pediatric): Secondary | ICD-10-CM

## 2016-09-19 DIAGNOSIS — N138 Other obstructive and reflux uropathy: Secondary | ICD-10-CM | POA: Diagnosis not present

## 2016-09-19 DIAGNOSIS — E782 Mixed hyperlipidemia: Secondary | ICD-10-CM | POA: Diagnosis not present

## 2016-09-19 DIAGNOSIS — Z8739 Personal history of other diseases of the musculoskeletal system and connective tissue: Secondary | ICD-10-CM

## 2016-09-19 DIAGNOSIS — N401 Enlarged prostate with lower urinary tract symptoms: Secondary | ICD-10-CM | POA: Diagnosis not present

## 2016-09-19 LAB — LIPID PANEL
CHOLESTEROL: 204 mg/dL — AB (ref 0–200)
HDL: 47.3 mg/dL (ref >39.00)
LDL CALC: 135 mg/dL — AB (ref 0–99)
NonHDL: 156.41
TRIGLYCERIDES: 105 mg/dL (ref 0.0–149.0)
Total CHOL/HDL Ratio: 4
VLDL: 21 mg/dL (ref 0.0–40.0)

## 2016-09-19 LAB — BASIC METABOLIC PANEL
BUN: 23 mg/dL (ref 6–23)
CALCIUM: 9.7 mg/dL (ref 8.4–10.5)
CO2: 31 meq/L (ref 19–32)
Chloride: 104 mEq/L (ref 96–112)
Creatinine, Ser: 0.88 mg/dL (ref 0.40–1.50)
GFR: 89.47 mL/min (ref >60.00)
GLUCOSE: 101 mg/dL — AB (ref 70–99)
Potassium: 4.6 mEq/L (ref 3.5–5.1)
SODIUM: 140 meq/L (ref 135–145)

## 2016-09-19 LAB — POC URINALSYSI DIPSTICK (AUTOMATED)
Bilirubin, UA: NEGATIVE
Blood, UA: NEGATIVE
Glucose, UA: NEGATIVE
KETONES UA: NEGATIVE
LEUKOCYTES UA: NEGATIVE
NITRITE UA: NEGATIVE
PH UA: 6 (ref 5.0–8.0)
PROTEIN UA: NEGATIVE
Spec Grav, UA: 1.025 (ref 1.010–1.025)
UROBILINOGEN UA: 0.2 U/dL

## 2016-09-19 LAB — CBC WITH DIFFERENTIAL/PLATELET
BASOS ABS: 0 10*3/uL (ref 0.0–0.1)
Basophils Relative: 0.4 % (ref 0.0–3.0)
Eosinophils Absolute: 0.2 10*3/uL (ref 0.0–0.7)
Eosinophils Relative: 3.7 % (ref 0.0–5.0)
HCT: 50 % (ref 39.0–52.0)
HEMOGLOBIN: 16.9 g/dL (ref 13.0–17.0)
LYMPHS ABS: 0.9 10*3/uL (ref 0.7–4.0)
LYMPHS PCT: 20.2 % (ref 12.0–46.0)
MCHC: 33.7 g/dL (ref 30.0–36.0)
MCV: 93.9 fl (ref 78.0–100.0)
Monocytes Absolute: 0.4 10*3/uL (ref 0.1–1.0)
Monocytes Relative: 8.8 % (ref 3.0–12.0)
NEUTROS PCT: 66.9 % (ref 43.0–77.0)
Neutro Abs: 3.1 10*3/uL (ref 1.4–7.7)
Platelets: 148 10*3/uL — ABNORMAL LOW (ref 150.0–400.0)
RBC: 5.33 Mil/uL (ref 4.22–5.81)
RDW: 12.7 % (ref 11.5–15.5)
WBC: 4.7 10*3/uL (ref 4.0–10.5)

## 2016-09-19 LAB — HEPATIC FUNCTION PANEL
ALBUMIN: 4.8 g/dL (ref 3.5–5.2)
ALT: 16 U/L (ref 0–53)
AST: 19 U/L (ref 0–37)
Alkaline Phosphatase: 74 U/L (ref 39–117)
BILIRUBIN DIRECT: 0.2 mg/dL (ref 0.0–0.3)
TOTAL PROTEIN: 6.8 g/dL (ref 6.0–8.3)
Total Bilirubin: 1 mg/dL (ref 0.2–1.2)

## 2016-09-19 LAB — PSA: PSA: 1.7 ng/mL (ref 0.10–4.00)

## 2016-09-19 LAB — TSH: TSH: 1.22 u[IU]/mL (ref 0.35–4.50)

## 2016-09-19 LAB — HEMOGLOBIN A1C: Hgb A1c MFr Bld: 5.3 % (ref 4.6–6.5)

## 2016-09-19 MED ORDER — LOSARTAN POTASSIUM 25 MG PO TABS: 25.0000 mg | ORAL_TABLET | Freq: Every day | ORAL | 3 refills | Status: DC

## 2016-09-19 MED ORDER — METAXALONE 400 MG PO TABS: 400.0000 mg | ORAL_TABLET | Freq: Three times a day (TID) | ORAL | 3 refills | Status: DC | PRN

## 2016-09-19 MED ORDER — TAMSULOSIN HCL 0.4 MG PO CAPS: 0.4000 mg | ORAL_CAPSULE | Freq: Every day | ORAL | 3 refills | Status: DC

## 2016-09-19 NOTE — Progress Notes (Signed)
   Subjective:    Patient ID: Alex Weiss, male    DOB: 01-12-1941, 76 y.o.   MRN: [id number]  HPI Here to follow up on issues. He feels well in general. He tries to walk 5 miles a day. He still see the [facility] yearly. He is using his CPAP regualrly and he feels  much better.    Review of Systems  Constitutional: Negative.   HENT: Negative.   Eyes: Negative.   Respiratory: Negative.   Cardiovascular: Negative.   Gastrointestinal: Negative.   Genitourinary: Negative.   Musculoskeletal: Negative.   Skin: Negative.   Neurological: Negative.   Psychiatric/Behavioral: Negative.        Objective:   Physical Exam  Constitutional: He is oriented to person, place, and time. He appears well-developed and well-nourished. No distress.  HENT:  Head: Normocephalic and atraumatic.  Right Ear: External ear normal.  Left Ear: External ear normal.  Nose: Nose normal.  Mouth/Throat: Oropharynx is clear and moist. No oropharyngeal exudate.  Eyes: Conjunctivae and EOM are normal. Pupils are equal, round, and reactive to light. Right eye exhibits no discharge. Left eye exhibits no discharge. No scleral icterus.  Neck: Neck supple. No JVD present. No tracheal deviation present. No thyromegaly present.  Cardiovascular: Normal rate, regular rhythm, normal heart sounds and intact distal pulses.  Exam reveals no gallop and no friction rub.   No murmur heard. Pulmonary/Chest: Effort normal and breath sounds normal. No respiratory distress. He has no wheezes. He has no rales. He exhibits no tenderness.  Abdominal: Soft. Bowel sounds are normal. He exhibits no distension and no mass. There is no tenderness. There is no rebound and no guarding.  Genitourinary: Rectum normal, prostate normal and penis normal. Rectal exam shows guaiac negative stool. No penile tenderness.  Musculoskeletal: Normal range of motion. He exhibits no edema or tenderness.  Lymphadenopathy:    He has no cervical adenopathy.    Neurological: He is alert and oriented to person, place, and time. He has normal reflexes. No cranial nerve deficit. He exhibits normal muscle tone. Coordination normal.  Skin: Skin is warm and dry. No rash noted. He is not diaphoretic. No erythema. No pallor.  Psychiatric: He has a normal mood and affect. His behavior is normal. Judgment and thought content normal.          Assessment & Plan:  His BP is well controlled. Get fasting labs to check his glucose and lipids. His BPH is controled on Flomax. His sleep apnea is stable.  Alysia Penna, MD

## 2016-09-19 NOTE — Patient Instructions (Signed)
WE NOW OFFER   [facility] FAST TRACK!!!  SAME DAY Appointments for ACUTE CARE  Such as: Sprains, Injuries, cuts, abrasions, rashes, muscle pain, joint pain, back pain Colds, flu, sore throats, headache, allergies, cough, fever  Ear pain, sinus and eye infections Abdominal pain, nausea, vomiting, diarrhea, upset stomach Animal/insect bites  3 Easy Ways to Schedule: Walk-In Scheduling Call in scheduling Mychart Sign-up: [email or web address]         

## 2016-09-26 ENCOUNTER — Telehealth: Payer: Self-pay | Admitting: Family Medicine

## 2016-09-26 DIAGNOSIS — L570 Actinic keratosis: Secondary | ICD-10-CM | POA: Diagnosis not present

## 2016-09-26 DIAGNOSIS — L812 Freckles: Secondary | ICD-10-CM | POA: Diagnosis not present

## 2016-09-26 DIAGNOSIS — L821 Other seborrheic keratosis: Secondary | ICD-10-CM | POA: Diagnosis not present

## 2016-09-26 DIAGNOSIS — D1801 Hemangioma of skin and subcutaneous tissue: Secondary | ICD-10-CM | POA: Diagnosis not present

## 2016-09-26 DIAGNOSIS — D225 Melanocytic nevi of trunk: Secondary | ICD-10-CM | POA: Diagnosis not present

## 2016-09-26 NOTE — Telephone Encounter (Signed)
stacey at [facility] states the order for the pt's bone density is not correct.   The dx code is wrong. Pt has an appointment wed  Will need you to change. Thanks!!

## 2016-09-27 NOTE — Telephone Encounter (Signed)
Can you please get a phone number for me to call? Pt might have to reschedule this because I'm not sure what other code we can use?

## 2016-09-27 NOTE — Telephone Encounter (Signed)
Spoke with wife, pt was driving.  Advised we needed to reschedule his bone density.   Pt aware will need to wait for Dr [name] to return to update Dx code on order.  Pt will call back to reschedule.

## 2016-09-28 ENCOUNTER — Inpatient Hospital Stay: Admission: RE | Admit: 2016-09-28 | Payer: Medicare Other | Source: Ambulatory Visit

## 2016-10-03 ENCOUNTER — Other Ambulatory Visit: Payer: Self-pay | Admitting: Family Medicine

## 2016-10-03 DIAGNOSIS — M810 Age-related osteoporosis without current pathological fracture: Secondary | ICD-10-CM

## 2016-10-03 NOTE — Telephone Encounter (Signed)
Please change the dx code to M81.0 (osteoporosis)

## 2016-10-03 NOTE — Telephone Encounter (Signed)
I put in new order and spoke with pt, advised him to call our office back if he doesn't get scheduled by Friday.

## 2016-10-07 ENCOUNTER — Encounter: Payer: Self-pay | Admitting: Adult Health

## 2016-10-07 ENCOUNTER — Ambulatory Visit (INDEPENDENT_AMBULATORY_CARE_PROVIDER_SITE_OTHER): Payer: Medicare Other | Admitting: Adult Health

## 2016-10-07 VITALS — BP 130/70 | HR 61 | Ht 75.0 in | Wt 211.0 lb

## 2016-10-07 DIAGNOSIS — G4733 Obstructive sleep apnea (adult) (pediatric): Secondary | ICD-10-CM

## 2016-10-07 NOTE — Patient Instructions (Addendum)
Continue on CPAP At bedtime   Add Chin strap.  Order for dream wear gel nasal pillows.  Keep up good work .  follow up Dr. Elsworth Soho  In 3-4 months and As needed

## 2016-10-07 NOTE — Progress Notes (Signed)
@Patient  ID: Alex Weiss, male    DOB: [birth year], 76 y.o.   MRN: [id number]  Chief Complaint  Patient presents with  . Follow-up    OSA     Referring provider: Laurey Morale, MD  HPI: 76 year old male followed for mild obstructive sleep apnea  TEST  Significant tests/ events  HST 01/2016 mild overall with 13 events per hour, severe on his back with 33 events per hour  04/2016 CPAP 8 cm   10/07/2016 Follow up ; OSA  Patient presents for a follow-up for sleep apnea. Patient has recently started on CPAP for his mild OSA. Snoring has improved .  He feels some better with slight decrease in daytime sleepiness. Patient wears for average about 6 hours each night. Download shows excellent compliance with average usage is 6.5 hours. He is on AutoSet 5-12 7 is H2O. AHI 1.0. Minimal leaks. Does not like the nasal pillows head gear, no adjustable strap and tubing in on side .     Allergies  Allergen Reactions  . Ivp Dye [Iodinated Diagnostic Agents] Hives  . Penicillins Hives    Immunization History  Administered Date(s) Administered  . Influenza Split 02/09/2011, 01/20/2012, 01/28/2013  . Influenza Whole 12/28/2007  . Influenza, High Dose Seasonal PF 01/05/2016  . Influenza-Unspecified 01/15/2014, 01/07/2015  . Pneumococcal Conjugate-13 08/15/2013  . Pneumococcal Polysaccharide-23 08/03/2006  . Tdap 08/15/2013  . Zoster 08/03/2007    Past Medical History:  Diagnosis Date  . Allergy   . Arthritis   . BPH (benign prostatic hypertrophy)    sees Dr. Irine Seal   . Cataract    starting   . FH: colonic polyps   . GERD (gastroesophageal reflux disease)   . Hematuria, microscopic   . History of nephrolithiasis    1962  . History of prostatitis   . History of ulcerative colitis   . OSA (obstructive sleep apnea)   . Premature atrial contraction   . PVC's (premature ventricular contractions)   . Renal calculus, left    NON-OBSTRUCTING  . Wears glasses     Tobacco  History: History  Smoking Status  . Former Smoker  . Packs/day: 2.00  . Years: 17.00  . Types: Cigarettes  . Quit date: 04/05/1975  Smokeless Tobacco  . Never Used   Counseling given: Not Answered   Outpatient Encounter Prescriptions as of 10/07/2016  Medication Sig  . aspirin 81 MG tablet Take 81 mg by mouth daily.  . famotidine (PEPCID) 20 MG tablet Take 20 mg by mouth 2 (two) times daily.   . folic acid (FOLVITE) 1 MG tablet Take 1 mg by mouth daily.    Marland Kitchen losartan (COZAAR) 25 MG tablet Take 1 tablet (25 mg total) by mouth daily.  . metaxalone (SKELAXIN) 400 MG tablet Take 1 tablet (400 mg total) by mouth 3 (three) times daily as needed (muscle spasms).  . Multiple Vitamin (MULTIVITAMIN) tablet Take 1 tablet by mouth daily.  Marland Kitchen OVER THE COUNTER MEDICATION Take 1 tablet by mouth daily. Allertec daily for allergies  . sulindac (CLINORIL) 200 MG tablet TAKE 1 TABLET DAILY  . tamsulosin (FLOMAX) 0.4 MG CAPS capsule Take 1 capsule (0.4 mg total) by mouth daily.  Marland Kitchen triamcinolone cream (KENALOG) 0.1 % Apply 1 application topically 2 (two) times daily as needed (outter ear itching & dryness).   No facility-administered encounter medications on file as of 10/07/2016.      Review of Systems  Constitutional:   No  weight loss,  night sweats,  Fevers, chills, fatigue, or  lassitude.  HEENT:   No headaches,  Difficulty swallowing,  Tooth/dental problems, or  Sore throat,                No sneezing, itching, ear ache, nasal congestion, post nasal drip,   CV:  No chest pain,  Orthopnea, PND, swelling in lower extremities, anasarca, dizziness, palpitations, syncope.   GI  No heartburn, indigestion, abdominal pain, nausea, vomiting, diarrhea, change in bowel habits, loss of appetite, bloody stools.   Resp: No shortness of breath with exertion or at rest.  No excess mucus, no productive cough,  No non-productive cough,  No coughing up of blood.  No change in color of mucus.  No wheezing.  No chest  wall deformity  Skin: no rash or lesions.  GU: no dysuria, change in color of urine, no urgency or frequency.  No flank pain, no hematuria   MS:  No joint pain or swelling.  No decreased range of motion.  No back pain.    Physical Exam  BP 130/70 (BP Location: Left Arm, Patient Position: Sitting, Cuff Size: Normal)   Pulse 61   Ht 6\' 3"  (1.905 m)   Wt 211 lb (95.7 kg)   SpO2 95%   BMI 26.37 kg/m   GEN: A/Ox3; pleasant , NAD, well nourished    HEENT:  [place]/AT,  EACs-clear, TMs-wnl, NOSE-clear, THROAT-clear, no lesions, no postnasal drip or exudate noted. Class 2-3 MP airway   NECK:  Supple w/ fair ROM; no JVD; normal carotid impulses w/o bruits; no thyromegaly or nodules palpated; no lymphadenopathy.    RESP  Clear  P & A; w/o, wheezes/ rales/ or rhonchi. no accessory muscle use, no dullness to percussion  CARD:  RRR, no m/r/g, no peripheral edema, pulses intact, no cyanosis or clubbing.  GI:   Soft & nt; nml bowel sounds; no organomegaly or masses detected.   Musco: Warm bil, no deformities or joint swelling noted.   Neuro: alert, no focal deficits noted.    Skin: Warm, no lesions or rashes    Lab Results:  CBC  BNP No results found for: BNP  ProBNP No results found for: PROBNP  Imaging: No results found.   Assessment & Plan:   OSA (obstructive sleep apnea) Improved control on CPAP  Will try new nasal pillows   Plan  Patient Instructions  Continue on CPAP At bedtime   Add Chin strap.  Order for dream wear gel nasal pillows.  Keep up good work .  follow up Dr. Elsworth Soho  In 3-4 months and As needed          Rexene Edison, NP 10/07/2016

## 2016-10-07 NOTE — Assessment & Plan Note (Signed)
Improved control on CPAP  Will try new nasal pillows   Plan  Patient Instructions  Continue on CPAP At bedtime   Add Chin strap.  Order for dream wear gel nasal pillows.  Keep up good work .  follow up Dr. Elsworth Soho  In 3-4 months and As needed

## 2016-10-09 NOTE — Progress Notes (Signed)
Reviewed & agree with plan  

## 2016-10-26 ENCOUNTER — Ambulatory Visit (INDEPENDENT_AMBULATORY_CARE_PROVIDER_SITE_OTHER)
Admission: RE | Admit: 2016-10-26 | Discharge: 2016-10-26 | Disposition: A | Discharge status: Home / Self Care | Payer: Medicare Other | Source: Ambulatory Visit | Attending: Family Medicine | Admitting: Family Medicine

## 2016-10-26 DIAGNOSIS — M810 Age-related osteoporosis without current pathological fracture: Secondary | ICD-10-CM

## 2016-12-22 ENCOUNTER — Encounter: Payer: Self-pay | Admitting: Family Medicine

## 2017-01-06 DIAGNOSIS — Z23 Encounter for immunization: Secondary | ICD-10-CM | POA: Diagnosis not present

## 2017-01-13 ENCOUNTER — Ambulatory Visit: Payer: Medicare Other | Admitting: Pulmonary Disease

## 2017-02-07 ENCOUNTER — Other Ambulatory Visit: Payer: Self-pay | Admitting: Family Medicine

## 2017-02-07 NOTE — Telephone Encounter (Signed)
° ° ° ° °  Pt following up on refill    sulindac (CLINORIL) 200 MG tablet     [facility]

## 2017-02-07 NOTE — Telephone Encounter (Signed)
Call in #90 with 3 rf  

## 2017-02-07 NOTE — Telephone Encounter (Signed)
Can we refill this? 

## 2017-02-08 ENCOUNTER — Other Ambulatory Visit: Payer: Self-pay | Admitting: Family Medicine

## 2017-02-08 ENCOUNTER — Telehealth: Payer: Self-pay | Admitting: Family Medicine

## 2017-02-08 MED ORDER — SULINDAC 200 MG PO TABS: 200.0000 mg | ORAL_TABLET | Freq: Every day | ORAL | 3 refills | Status: DC

## 2017-02-08 NOTE — Telephone Encounter (Signed)
We did this yesterday

## 2017-02-08 NOTE — Telephone Encounter (Signed)
Refill request for Clinoril 200 mg, can we refill this?

## 2017-02-08 NOTE — Telephone Encounter (Signed)
Script did not go thru, per Dr. [name] [facility] for # [facility] with 3 refills and I did.

## 2017-02-08 NOTE — Addendum Note (Signed)
Addended by: Aggie Hacker A on: 02/08/2017 01:02 PM   Modules accepted: Orders

## 2017-04-17 DIAGNOSIS — N401 Enlarged prostate with lower urinary tract symptoms: Secondary | ICD-10-CM | POA: Diagnosis not present

## 2017-04-17 DIAGNOSIS — N200 Calculus of kidney: Secondary | ICD-10-CM | POA: Diagnosis not present

## 2017-04-17 DIAGNOSIS — R3915 Urgency of urination: Secondary | ICD-10-CM | POA: Diagnosis not present

## 2017-05-29 ENCOUNTER — Encounter: Payer: Self-pay | Admitting: Family Medicine

## 2017-05-29 ENCOUNTER — Ambulatory Visit (INDEPENDENT_AMBULATORY_CARE_PROVIDER_SITE_OTHER): Payer: Medicare Other | Admitting: Family Medicine

## 2017-05-29 VITALS — BP 122/68 | HR 79 | Temp 98.8°F | Wt 215.2 lb

## 2017-05-29 DIAGNOSIS — R6889 Other general symptoms and signs: Secondary | ICD-10-CM | POA: Diagnosis not present

## 2017-05-29 LAB — POC INFLUENZA A&B (BINAX/QUICKVUE)
Influenza A, POC: NEGATIVE
Influenza B, POC: NEGATIVE

## 2017-05-29 MED ORDER — OSELTAMIVIR PHOSPHATE 75 MG PO CAPS: 75.0000 mg | ORAL_CAPSULE | Freq: Two times a day (BID) | ORAL | 0 refills | Status: DC

## 2017-05-29 NOTE — Progress Notes (Signed)
   Subjective:    Patient ID: TODD JELINSKI, male    DOB: 01-22-1941, 77 y.o.   MRN: [id number]  HPI Here for 2 days of fever to 100 degrees, body aches, a dry cough, and headache. He is drinking fluids and taking Ibuprofen. His wife and grandchild have recently tested positive for influenza.     Review of Systems  Constitutional: Positive for fever.  HENT: Positive for sore throat.   Eyes: Negative.   Respiratory: Positive for cough.   Musculoskeletal: Positive for myalgias.       Objective:   Physical Exam  Constitutional: He is oriented to person, place, and time. He appears well-developed and well-nourished.  HENT:  Right Ear: External ear normal.  Left Ear: External ear normal.  Nose: Nose normal.  Mouth/Throat: Oropharynx is clear and moist.  Eyes: Conjunctivae are normal.  Neck: No thyromegaly present.  Pulmonary/Chest: Effort normal and breath sounds normal. No respiratory distress. He has no wheezes. He has no rales.  Lymphadenopathy:    He has no cervical adenopathy.  Neurological: He is alert and oriented to person, place, and time.          Assessment & Plan:  Influenza, treat with Tamiflu. Drink fluids.  Alysia Penna, MD

## 2017-06-13 ENCOUNTER — Encounter: Payer: Self-pay | Admitting: Family Medicine

## 2017-06-14 MED ORDER — AMLODIPINE BESYLATE 5 MG PO TABS: 5.0000 mg | ORAL_TABLET | Freq: Every day | ORAL | 3 refills | Status: DC

## 2017-06-14 NOTE — Telephone Encounter (Signed)
I changed this to Amlodipine 5 mg daily and sent this to [facility]

## 2017-06-26 ENCOUNTER — Encounter: Payer: Self-pay | Admitting: Family Medicine

## 2017-06-27 ENCOUNTER — Telehealth: Payer: Self-pay

## 2017-06-27 NOTE — Telephone Encounter (Signed)
Yes that would be fine. [facility] to [facility] the A[facility] and to refill Losartan #90 with 3 rf. Send to local pharmacy

## 2017-06-27 NOTE — Telephone Encounter (Signed)
Yes that would be fine. [facility] to [facility] the Amlodipine and to refill Losartan #90 with 3 rf

## 2017-06-28 ENCOUNTER — Other Ambulatory Visit: Payer: Self-pay

## 2017-06-28 MED ORDER — LOSARTAN POTASSIUM 25 MG PO TABS: 25.0000 mg | ORAL_TABLET | Freq: Every day | ORAL | 3 refills | Status: DC

## 2017-06-28 NOTE — Telephone Encounter (Signed)
Rx has been sent for Losartan 25 MG we have removed the amlodipine from pt's medication list and called [facility] to have the prescription removed.

## 2017-06-28 NOTE — Telephone Encounter (Signed)
This has been done. Pt advised and voiced understanding.

## 2017-09-22 ENCOUNTER — Encounter: Payer: Self-pay | Admitting: Family Medicine

## 2017-09-22 ENCOUNTER — Ambulatory Visit (INDEPENDENT_AMBULATORY_CARE_PROVIDER_SITE_OTHER): Payer: Medicare Other | Admitting: Family Medicine

## 2017-09-22 VITALS — BP 130/78 | HR 67 | Temp 98.0°F | Ht 72.0 in | Wt 208.8 lb

## 2017-09-22 DIAGNOSIS — E782 Mixed hyperlipidemia: Secondary | ICD-10-CM

## 2017-09-22 DIAGNOSIS — N401 Enlarged prostate with lower urinary tract symptoms: Secondary | ICD-10-CM

## 2017-09-22 DIAGNOSIS — N138 Other obstructive and reflux uropathy: Secondary | ICD-10-CM

## 2017-09-22 DIAGNOSIS — R002 Palpitations: Secondary | ICD-10-CM | POA: Diagnosis not present

## 2017-09-22 DIAGNOSIS — R739 Hyperglycemia, unspecified: Secondary | ICD-10-CM | POA: Diagnosis not present

## 2017-09-22 DIAGNOSIS — G4733 Obstructive sleep apnea (adult) (pediatric): Secondary | ICD-10-CM

## 2017-09-22 DIAGNOSIS — I10 Essential (primary) hypertension: Secondary | ICD-10-CM

## 2017-09-22 DIAGNOSIS — M150 Primary generalized (osteo)arthritis: Secondary | ICD-10-CM

## 2017-09-22 DIAGNOSIS — M159 Polyosteoarthritis, unspecified: Secondary | ICD-10-CM

## 2017-09-22 DIAGNOSIS — Z87442 Personal history of urinary calculi: Secondary | ICD-10-CM

## 2017-09-22 LAB — LIPID PANEL
CHOL/HDL RATIO: 4
Cholesterol: 195 mg/dL (ref 0–200)
HDL: 48.7 mg/dL (ref >39.00)
LDL Cholesterol: 127 mg/dL — ABNORMAL HIGH (ref 0–99)
NonHDL: 145.82
TRIGLYCERIDES: 96 mg/dL (ref 0.0–149.0)
VLDL: 19.2 mg/dL (ref 0.0–40.0)

## 2017-09-22 LAB — CBC WITH DIFFERENTIAL/PLATELET
Basophils Absolute: 0 10*3/uL (ref 0.0–0.1)
Basophils Relative: 0.6 % (ref 0.0–3.0)
EOS PCT: 4.3 % (ref 0.0–5.0)
Eosinophils Absolute: 0.2 10*3/uL (ref 0.0–0.7)
HCT: 46.2 % (ref 39.0–52.0)
Hemoglobin: 16.4 g/dL (ref 13.0–17.0)
LYMPHS ABS: 0.8 10*3/uL (ref 0.7–4.0)
Lymphocytes Relative: 21.6 % (ref 12.0–46.0)
MCHC: 35.6 g/dL (ref 30.0–36.0)
MCV: 92.4 fl (ref 78.0–100.0)
MONOS PCT: 9.4 % (ref 3.0–12.0)
Monocytes Absolute: 0.4 10*3/uL (ref 0.1–1.0)
NEUTROS ABS: 2.4 10*3/uL (ref 1.4–7.7)
NEUTROS PCT: 64.1 % (ref 43.0–77.0)
Platelets: 140 10*3/uL — ABNORMAL LOW (ref 150.0–400.0)
RBC: 5 Mil/uL (ref 4.22–5.81)
RDW: 12.4 % (ref 11.5–15.5)
WBC: 3.8 10*3/uL — ABNORMAL LOW (ref 4.0–10.5)

## 2017-09-22 LAB — HEPATIC FUNCTION PANEL
ALBUMIN: 4.5 g/dL (ref 3.5–5.2)
ALK PHOS: 75 U/L (ref 39–117)
ALT: 20 U/L (ref 0–53)
AST: 20 U/L (ref 0–37)
Bilirubin, Direct: 0.2 mg/dL (ref 0.0–0.3)
Total Bilirubin: 1.1 mg/dL (ref 0.2–1.2)
Total Protein: 6.8 g/dL (ref 6.0–8.3)

## 2017-09-22 LAB — BASIC METABOLIC PANEL
BUN: 18 mg/dL (ref 6–23)
CHLORIDE: 104 meq/L (ref 96–112)
CO2: 32 mEq/L (ref 19–32)
Calcium: 9.6 mg/dL (ref 8.4–10.5)
Creatinine, Ser: 0.92 mg/dL (ref 0.40–1.50)
GFR: 84.76 mL/min (ref >60.00)
Glucose, Bld: 104 mg/dL — ABNORMAL HIGH (ref 70–99)
POTASSIUM: 4.7 meq/L (ref 3.5–5.1)
Sodium: 141 mEq/L (ref 135–145)

## 2017-09-22 LAB — PSA: PSA: 2.33 ng/mL (ref 0.10–4.00)

## 2017-09-22 LAB — TSH: TSH: 1.13 u[IU]/mL (ref 0.35–4.50)

## 2017-09-22 LAB — HEMOGLOBIN A1C: Hgb A1c MFr Bld: 5.3 % (ref 4.6–6.5)

## 2017-09-22 MED ORDER — LOSARTAN POTASSIUM 25 MG PO TABS: 25.0000 mg | ORAL_TABLET | Freq: Every day | ORAL | 3 refills | Status: DC

## 2017-09-22 MED ORDER — METAXALONE 400 MG PO TABS: 400.0000 mg | ORAL_TABLET | Freq: Three times a day (TID) | ORAL | 0 refills | Status: DC | PRN

## 2017-09-22 MED ORDER — TAMSULOSIN HCL 0.4 MG PO CAPS: 0.4000 mg | ORAL_CAPSULE | Freq: Every day | ORAL | 3 refills | Status: DC

## 2017-09-22 MED ORDER — SULINDAC 200 MG PO TABS: 200.0000 mg | ORAL_TABLET | Freq: Every day | ORAL | 3 refills | Status: DC

## 2017-09-22 MED ORDER — TRIAMCINOLONE ACETONIDE 0.1 % EX CREA: 1.0000 "application " | TOPICAL_CREAM | Freq: Two times a day (BID) | CUTANEOUS | 5 refills | Status: DC | PRN

## 2017-09-22 NOTE — Progress Notes (Signed)
   Subjective:    Patient ID: Alex Weiss, male    DOB: 06-24-1940, 77 y.o.   MRN: [id number]  HPI Here to follow up on issues. He feels fine in general. He saw Dr. Roni Bread in April to follow the left kidney stone, and this seems to be stable. He has no symptoms from it. His BP is stable. His osteoarthritis is stable. He had a DEXA last year which showed borderline osteopenia. He does take calcium and vitamin D supplements daily. He will see Dr. Jarome Matin next week for a skin exam. He still uses his CPAP machine and he sleeps well.    Review of Systems  Constitutional: Negative.   HENT: Negative.   Eyes: Negative.   Respiratory: Negative.   Cardiovascular: Negative.   Gastrointestinal: Negative.   Genitourinary: Negative.   Musculoskeletal: Positive for arthralgias.  Skin: Negative.   Neurological: Negative.   Psychiatric/Behavioral: Negative.        Objective:   Physical Exam  Constitutional: He is oriented to person, place, and time. He appears well-developed and well-nourished. No distress.  HENT:  Head: Normocephalic and atraumatic.  Right Ear: External ear normal.  Left Ear: External ear normal.  Nose: Nose normal.  Mouth/Throat: Oropharynx is clear and moist. No oropharyngeal exudate.  Eyes: Pupils are equal, round, and reactive to light. Conjunctivae and EOM are normal. Right eye exhibits no discharge. Left eye exhibits no discharge. No scleral icterus.  Neck: Neck supple. No JVD present. No tracheal deviation present. No thyromegaly present.  Cardiovascular: Normal rate, regular rhythm, normal heart sounds and intact distal pulses. Exam reveals no gallop and no friction rub.  No murmur heard. Pulmonary/Chest: Effort normal and breath sounds normal. No respiratory distress. He has no wheezes. He has no rales. He exhibits no tenderness.  Abdominal: Soft. Bowel sounds are normal. He exhibits no distension and no mass. There is no tenderness. There is no rebound and no  guarding.  Genitourinary: Rectum normal, prostate normal and penis normal. Rectal exam shows guaiac negative stool. No penile tenderness.  Musculoskeletal: Normal range of motion. He exhibits no edema or tenderness.  Lymphadenopathy:    He has no cervical adenopathy.  Neurological: He is alert and oriented to person, place, and time. He has normal reflexes. He displays normal reflexes. No cranial nerve deficit. He exhibits normal muscle tone. Coordination normal.  Skin: Skin is warm and dry. No rash noted. He is not diaphoretic. No erythema. No pallor.  Psychiatric: He has a normal mood and affect. His behavior is normal. Judgment and thought content normal.          Assessment & Plan:  His HTN and osteoarthritis are stable. His kidney stone is stable. His sleep apnea is controlled. We will get fasting labs today to check lipids and an A1c, etc.  Alysia Penna, MD

## 2017-09-25 DIAGNOSIS — H1851 Endothelial corneal dystrophy: Secondary | ICD-10-CM | POA: Diagnosis not present

## 2017-09-25 DIAGNOSIS — H2513 Age-related nuclear cataract, bilateral: Secondary | ICD-10-CM | POA: Diagnosis not present

## 2017-09-25 DIAGNOSIS — H5203 Hypermetropia, bilateral: Secondary | ICD-10-CM | POA: Diagnosis not present

## 2017-09-26 DIAGNOSIS — L723 Sebaceous cyst: Secondary | ICD-10-CM | POA: Diagnosis not present

## 2017-09-26 DIAGNOSIS — L821 Other seborrheic keratosis: Secondary | ICD-10-CM | POA: Diagnosis not present

## 2017-09-26 DIAGNOSIS — L570 Actinic keratosis: Secondary | ICD-10-CM | POA: Diagnosis not present

## 2017-09-26 DIAGNOSIS — D1801 Hemangioma of skin and subcutaneous tissue: Secondary | ICD-10-CM | POA: Diagnosis not present

## 2017-09-26 DIAGNOSIS — D225 Melanocytic nevi of trunk: Secondary | ICD-10-CM | POA: Diagnosis not present

## 2017-09-26 DIAGNOSIS — L812 Freckles: Secondary | ICD-10-CM | POA: Diagnosis not present

## 2018-01-02 DIAGNOSIS — Z23 Encounter for immunization: Secondary | ICD-10-CM | POA: Diagnosis not present

## 2018-02-15 ENCOUNTER — Ambulatory Visit: Payer: Medicare Other | Admitting: Family Medicine

## 2018-02-16 DIAGNOSIS — L718 Other rosacea: Secondary | ICD-10-CM | POA: Diagnosis not present

## 2018-03-22 ENCOUNTER — Ambulatory Visit (INDEPENDENT_AMBULATORY_CARE_PROVIDER_SITE_OTHER): Payer: Medicare Other | Admitting: Family Medicine

## 2018-03-22 ENCOUNTER — Encounter: Payer: Self-pay | Admitting: Family Medicine

## 2018-03-22 VITALS — BP 120/62 | HR 78 | Temp 98.1°F | Ht 72.0 in | Wt 215.9 lb

## 2018-03-22 DIAGNOSIS — J069 Acute upper respiratory infection, unspecified: Secondary | ICD-10-CM

## 2018-03-22 LAB — POC INFLUENZA A&B (BINAX/QUICKVUE)
INFLUENZA B, POC: NEGATIVE
Influenza A, POC: POSITIVE — AB

## 2018-03-22 MED ORDER — BENZONATATE 100 MG PO CAPS: 100.0000 mg | ORAL_CAPSULE | Freq: Three times a day (TID) | ORAL | 0 refills | Status: DC | PRN

## 2018-03-22 NOTE — Addendum Note (Signed)
Addended by: Agnes Lawrence on: 03/22/2018 02:45 PM   Modules accepted: Orders

## 2018-03-22 NOTE — Patient Instructions (Signed)
INSTRUCTIONS FOR UPPER RESPIRATORY INFECTION:  -plenty of rest and fluids  -nasal saline wash 2-3 times daily (use prepackaged nasal saline or bottled/distilled water if making your own)   -in the winter time, using a humidifier at night is helpful (please follow cleaning instructions)  -if you are taking a cough medication - use only as directed, may also try a teaspoon of honey to coat the throat and throat lozenges. I sent the tessalon to the pharmacy for the cough. Please use only as directed and keep out of reach of children.  -for sore throat, salt water gargles can help  -follow up promptly if you have fevers, facial pain, tooth pain, difficulty breathing or are worsening or symptoms persist longer then expected  Upper Respiratory Infection, Adult An upper respiratory infection (URI) is also known as the common cold. It is often caused by a type of germ (virus). Colds are easily spread (contagious). You can pass it to others by kissing, coughing, sneezing, or drinking out of the same glass. Usually, you get better in 1 to 3  weeks.  However, the cough can last for even longer. HOME CARE   Only take medicine as told by your doctor. Follow instructions provided above.  Drink enough water and fluids to keep your pee (urine) clear or pale yellow.  Get plenty of rest.  Return to work when your temperature is < 100 for 24 hours or as told by your doctor. You may use a face mask and wash your hands to stop your cold from spreading. GET HELP RIGHT AWAY IF:   After the first few days, you feel you are getting worse.  You have questions about your medicine.  You have chills, shortness of breath, or red spit (mucus).  You have pain in the face for more then 1-2 days, especially when you bend forward.  You have a fever, puffy (swollen) neck, pain when you swallow, or white spots in the back of your throat.  You have a bad headache, ear pain, sinus pain, or chest pain.  You have a  high-pitched whistling sound when you breathe in and out (wheezing).  You cough up blood.  You have sore muscles or a stiff neck. MAKE SURE YOU:   Understand these instructions.  Will watch your condition.  Will get help right away if you are not doing well or get worse. Document Released: 09/07/2007 Document Revised: 06/13/2011 Document Reviewed: 06/26/2013 [facility] Patient Information 2015 [place], [place]. This information is not intended to replace advice given to you by your health care provider. Make sure you discuss any questions you have with your health care provider.

## 2018-03-22 NOTE — Progress Notes (Addendum)
HPI:  Using dictation device. Unfortunately this device frequently misinterprets words/phrases.   Acute visit for respiratory illness: -started:4 days ago -symptoms:nasal congestion, pnd, cough -denies:fever, SOB, NVD, tooth pain, body aches, wheezing -has tried: nothing -sick contacts/travel/risks: no reported flu, strep or tick exposure but did travel last week -reports "I need to be better" as has family coming for the holidays, does want to check flu test so can warn family if has it -had high dose flu shot  ROS: See pertinent positives and negatives per HPI.  Past Medical History:  Diagnosis Date  . Allergy   . Arthritis   . BPH (benign prostatic hypertrophy)    sees Dr. Irine Seal   . Cataract    starting   . FH: colonic polyps   . GERD (gastroesophageal reflux disease)   . Hematuria, microscopic   . History of nephrolithiasis    1962  . History of prostatitis   . History of ulcerative colitis   . OSA (obstructive sleep apnea)   . Premature atrial contraction   . PVC's (premature ventricular contractions)   . Renal calculus, left    NON-OBSTRUCTING  . Wears glasses     Past Surgical History:  Procedure Laterality Date  . ANTERIOR CERVICAL DECOMP/DISCECTOMY FUSION  1996   C3 --  C5  . BACK SURGERY    . CERVICAL LAMINECTOMY  02-04-2004   C7 -- T2  . COLONOSCOPY  11-11-14   per Dr. Ardis Hughs, no polyps, repeat in 5 yrs  . CYSTOSCOPY WITH BIOPSY N/A 09/05/2012   Procedure: CYSTOSCOPY WITH BIOPSY;  Surgeon: Molli Hazard, MD;  Location: [facility];  Service: Urology;  Laterality: N/A;  . LEFT QUAD TENDON REPAIR  2007  . TONSILLECTOMY  AGE 22  . TRACHEOSTOMY  AGE 72 MONTHS OLD   STREP THROAT  . WRIST GANGLION EXCISION Right 2003    Family History  Problem Relation Age of Onset  . Aortic dissection Brother   . Colon cancer Brother   . Aortic stenosis Sister   . Esophageal cancer Sister        dx age 58  . Arthritis Other   . Coronary  artery disease Other   . Depression Other   . Hypertension Other   . Lung cancer Other   . Stroke Other   . Cystic fibrosis Maternal Grandfather   . Colon cancer Other   . Suicidality Son   . Rectal cancer Neg Hx   . Stomach cancer Neg Hx     Social History   Socioeconomic History  . Marital status: Married    Spouse name: Not on file  . Number of children: Not on file  . Years of education: Not on file  . Highest education level: Not on file  Occupational History  . Not on file  Social Needs  . Financial resource strain: Not on file  . Food insecurity:    Worry: Not on file    Inability: Not on file  . Transportation needs:    Medical: Not on file    Non-medical: Not on file  Tobacco Use  . Smoking status: Former Smoker    Packs/day: 2.00    Years: 17.00    Pack years: 34.00    Types: Cigarettes    Last attempt to quit: 04/05/1975    Years since quitting: 42.9  . Smokeless tobacco: Never Used  Substance and Sexual Activity  . Alcohol use: Yes    Alcohol/week: 0.0  standard drinks    Comment: wine daily, occ beer  . Drug use: No  . Sexual activity: Not on file  Lifestyle  . Physical activity:    Days per week: Not on file    Minutes per session: Not on file  . Stress: Not on file  Relationships  . Social connections:    Talks on phone: Not on file    Gets together: Not on file    Attends religious service: Not on file    Active member of club or organization: Not on file    Attends meetings of clubs or organizations: Not on file    Relationship status: Not on file  Other Topics Concern  . Not on file  Social History Narrative   ** Merged History Encounter **         Current Outpatient Medications:  .  aspirin 81 MG tablet, Take 81 mg by mouth daily., Disp: , Rfl:  .  famotidine (PEPCID) 20 MG tablet, Take 20 mg by mouth 2 (two) times daily. , Disp: , Rfl:  .  losartan (COZAAR) 25 MG tablet, Take 1 tablet (25 mg total) by mouth daily., Disp: 90 tablet,  Rfl: 3 .  metaxalone (SKELAXIN) 400 MG tablet, Take 1 tablet (400 mg total) by mouth 3 (three) times daily as needed (muscle spasms)., Disp: 90 tablet, Rfl: 0 .  Multiple Vitamin (MULTIVITAMIN) tablet, Take 1 tablet by mouth daily., Disp: , Rfl:  .  NON FORMULARY, , Disp: , Rfl:  .  OVER THE COUNTER MEDICATION, Take 1 tablet by mouth daily. Allertec daily for allergies, Disp: , Rfl:  .  sulindac (CLINORIL) 200 MG tablet, Take 1 tablet (200 mg total) by mouth daily., Disp: 90 tablet, Rfl: 3 .  tamsulosin (FLOMAX) 0.4 MG CAPS capsule, Take 1 capsule (0.4 mg total) by mouth daily., Disp: 90 capsule, Rfl: 3 .  triamcinolone cream (KENALOG) 0.1 %, Apply 1 application topically 2 (two) times daily as needed (outter ear itching & dryness)., Disp: 45 g, Rfl: 5 .  benzonatate (TESSALON PERLES) 100 MG capsule, Take 1 capsule (100 mg total) by mouth 3 (three) times daily as needed., Disp: 20 capsule, Rfl: 0  EXAM:  Vitals:   03/22/18 1400  BP: 120/62  Pulse: 78  Temp: 98.1 F (36.7 C)  SpO2: 97%    Body mass index is 29.28 kg/m.  GENERAL: vitals reviewed and listed above, alert, oriented, appears well hydrated and in no acute distress  HEENT: atraumatic, conjunttiva clear, no obvious abnormalities on inspection of external nose and ears, normal appearance of ear canals and TMs, clear nasal congestion, mild post oropharyngeal erythema with PND, no tonsillar edema or exudate, no sinus TTP  NECK: no obvious masses on inspection  LUNGS: clear to auscultation bilaterally, no wheezes, rales or rhonchi, good air movement  CV: HRRR, no peripheral edema  MS: moves all extremities without noticeable abnormality  PSYCH: pleasant and cooperative, no obvious depression or anxiety  ASSESSMENT AND PLAN:  Discussed the following assessment and plan:  Viral upper respiratory illness  -given HPI and exam findings today, a serious infection or illness is unlikely. We discussed potential etiologies,  with VURI being most likely, and advised supportive care and monitoring. We discussed treatment side effects, likely course, antibiotic misuse, transmission, and signs of developing a serious illness. -discussed remote possibility mild flu and he wants to check more for information to pass on to visiting family and avoid transmission. Discussed risks benefits tamiflu and  at this point given duration symptoms he opted against. -tessalon for cough after discussion risks/benefits -of course, we advised to return or notify a doctor immediately if symptoms worsen or persist or new concerns arise. Addendum:flu test did come back positive, patient aware and opted for treatment per above.    Patient Instructions  INSTRUCTIONS FOR UPPER RESPIRATORY INFECTION:  -plenty of rest and fluids  -nasal saline wash 2-3 times daily (use prepackaged nasal saline or bottled/distilled water if making your own)   -in the winter time, using a humidifier at night is helpful (please follow cleaning instructions)  -if you are taking a cough medication - use only as directed, may also try a teaspoon of honey to coat the throat and throat lozenges. I sent the tessalon to the pharmacy for the cough. Please use only as directed and keep out of reach of children.  -for sore throat, salt water gargles can help  -follow up promptly if you have fevers, facial pain, tooth pain, difficulty breathing or are worsening or symptoms persist longer then expected  Upper Respiratory Infection, Adult An upper respiratory infection (URI) is also known as the common cold. It is often caused by a type of germ (virus). Colds are easily spread (contagious). You can pass it to others by kissing, coughing, sneezing, or drinking out of the same glass. Usually, you get better in 1 to 3  weeks.  However, the cough can last for even longer. HOME CARE   Only take medicine as told by your doctor. Follow instructions provided above.  Drink enough  water and fluids to keep your pee (urine) clear or pale yellow.  Get plenty of rest.  Return to work when your temperature is < 100 for 24 hours or as told by your doctor. You may use a face mask and wash your hands to stop your cold from spreading. GET HELP RIGHT AWAY IF:   After the first few days, you feel you are getting worse.  You have questions about your medicine.  You have chills, shortness of breath, or red spit (mucus).  You have pain in the face for more then 1-2 days, especially when you bend forward.  You have a fever, puffy (swollen) neck, pain when you swallow, or white spots in the back of your throat.  You have a bad headache, ear pain, sinus pain, or chest pain.  You have a high-pitched whistling sound when you breathe in and out (wheezing).  You cough up blood.  You have sore muscles or a stiff neck. MAKE SURE YOU:   Understand these instructions.  Will watch your condition.  Will get help right away if you are not doing well or get worse. Document Released: 09/07/2007 Document Revised: 06/13/2011 Document Reviewed: 06/26/2013 [facility] Patient Information 2015 [place], [place]. This information is not intended to replace advice given to you by your health care provider. Make sure you discuss any questions you have with your health care provider.     Lucretia Kern, DO

## 2018-03-26 ENCOUNTER — Ambulatory Visit (INDEPENDENT_AMBULATORY_CARE_PROVIDER_SITE_OTHER): Payer: Medicare Other | Admitting: Internal Medicine

## 2018-03-26 ENCOUNTER — Encounter: Payer: Self-pay | Admitting: Internal Medicine

## 2018-03-26 ENCOUNTER — Ambulatory Visit (INDEPENDENT_AMBULATORY_CARE_PROVIDER_SITE_OTHER)
Admission: RE | Admit: 2018-03-26 | Discharge: 2018-03-26 | Disposition: A | Discharge status: Home / Self Care | Payer: Medicare Other | Source: Ambulatory Visit | Attending: Internal Medicine | Admitting: Internal Medicine

## 2018-03-26 VITALS — BP 112/70 | HR 82 | Temp 98.2°F | Ht 72.0 in | Wt 215.0 lb

## 2018-03-26 DIAGNOSIS — R05 Cough: Secondary | ICD-10-CM

## 2018-03-26 DIAGNOSIS — R059 Cough, unspecified: Secondary | ICD-10-CM

## 2018-03-26 MED ORDER — METHYLPREDNISOLONE ACETATE 40 MG/ML IJ SUSP
40.0000 mg | Freq: Once | INTRAMUSCULAR | Status: AC
  Administered 2018-03-26: 40 mg via INTRAMUSCULAR

## 2018-03-26 MED ORDER — HYDROCODONE-HOMATROPINE 5-1.5 MG/5ML PO SYRP: 5.0000 mL | ORAL_SOLUTION | Freq: Every evening | ORAL | 0 refills | Status: DC | PRN

## 2018-03-26 NOTE — Progress Notes (Signed)
   Subjective:   Patient ID: Alex Weiss, male    DOB: 1940/05/03, 77 y.o.   MRN: [id number]  HPI The patient is a 77 YO man coming in for ongoing cough. Seen last week and diagnosed with flu through nose swab. Outside window for tamiflu so just given tessalon perles for cough. He is concerned that he did not get an antibiotic and he normally gets one. Still coughing and mostly non-productive but rare white sputum. Denies fevers or chills. Denies headaches (were present) and denies body aches. The nose drainage and sinus pressure are improving to gone. Denies getting SOB. Still very tired and no energy to do anything which is worrying him.   Review of Systems  Constitutional: Positive for activity change, appetite change and fatigue. Negative for chills, fever and unexpected weight change.  HENT: Negative for congestion, ear discharge, ear pain, postnasal drip, rhinorrhea, sinus pressure, sinus pain, sneezing, sore throat, tinnitus, trouble swallowing and voice change.   Eyes: Negative.   Respiratory: Positive for cough. Negative for chest tightness, shortness of breath and wheezing.   Cardiovascular: Negative.   Gastrointestinal: Negative.   Neurological: Negative.     Objective:  Physical Exam Constitutional:      Appearance: He is well-developed.  HENT:     Head: Normocephalic and atraumatic.  Neck:     Musculoskeletal: Normal range of motion.  Cardiovascular:     Rate and Rhythm: Normal rate and regular rhythm.  Pulmonary:     Effort: Pulmonary effort is normal. No respiratory distress.     Breath sounds: Normal breath sounds. No wheezing or rales.  Abdominal:     General: Bowel sounds are normal. There is no distension.     Palpations: Abdomen is soft.     Tenderness: There is no abdominal tenderness. There is no rebound.  Skin:    General: Skin is warm and dry.  Neurological:     Mental Status: He is alert and oriented to person, place, and time.     Coordination:  Coordination normal.    Vitals:   03/26/18 1052  BP: 112/70  Pulse: 82  Temp: 98.2 F (36.8 C)  TempSrc: Oral  SpO2: 96%  Weight: 215 lb (97.5 kg)  Height: 6' (1.829 m)    Assessment & Plan:  Depo-medrol 40 mg IM given at visit.

## 2018-03-26 NOTE — Assessment & Plan Note (Signed)
Depo-medrol 40 mg IM given at visit. We talked about normal course of cough lasting another 1-2 weeks. Getting CXR to rule out post-viral pneumonia. Treat as appropriate. Rx for hycodan cough medicine and [place] narcotic database reviewed and no inappropriate fills.

## 2018-03-26 NOTE — Patient Instructions (Signed)
We have sent in cough medicine today.   We are checking the chest x-ray today and have given you a shot which should help some.   It is possible that you will still cough for another 1-2 weeks.

## 2018-04-16 ENCOUNTER — Ambulatory Visit (INDEPENDENT_AMBULATORY_CARE_PROVIDER_SITE_OTHER): Payer: Medicare Other | Admitting: Family Medicine

## 2018-04-16 ENCOUNTER — Encounter: Payer: Self-pay | Admitting: Family Medicine

## 2018-04-16 VITALS — BP 128/70 | HR 70 | Temp 98.2°F | Wt 214.1 lb

## 2018-04-16 DIAGNOSIS — J019 Acute sinusitis, unspecified: Secondary | ICD-10-CM | POA: Diagnosis not present

## 2018-04-16 DIAGNOSIS — H6121 Impacted cerumen, right ear: Secondary | ICD-10-CM | POA: Diagnosis not present

## 2018-04-16 MED ORDER — LEVOFLOXACIN 500 MG PO TABS: 500.0000 mg | ORAL_TABLET | Freq: Every day | ORAL | 0 refills | Status: AC

## 2018-04-16 NOTE — Progress Notes (Signed)
   Subjective:    Patient ID: CRISTIN SZATKOWSKI, male    DOB: 01/20/1941, 78 y.o.   MRN: [id number]  HPI Here for 2 weeks of sinus pressure, PND, blowing yellow mucus from the nose, and pressure in the right ear. No cough or fever. He tested positive for influenza A on 03-22-18, but this resolved. Using an OTC decongestant.    Review of Systems  Constitutional: Negative.   HENT: Positive for congestion, hearing loss, postnasal drip and sinus pressure. Negative for ear pain, sinus pain and sore throat.   Eyes: Negative.   Respiratory: Negative.        Objective:   Physical Exam Constitutional:      Appearance: Normal appearance.  HENT:     Left Ear: Tympanic membrane and ear canal normal.     Ears:     Comments: Right ear canal is full of cerumen     Nose: Nose normal.     Mouth/Throat:     Pharynx: Oropharynx is clear.  Eyes:     Conjunctiva/sclera: Conjunctivae normal.  Pulmonary:     Effort: Pulmonary effort is normal.     Breath sounds: Normal breath sounds.  Lymphadenopathy:     Cervical: No cervical adenopathy.  Neurological:     Mental Status: He is alert.           Assessment & Plan:  He had a cerumen impaction and this was irrigated clear with water. Treat the sinusitis with Levaquin.  Alysia Penna, MD

## 2018-04-18 DIAGNOSIS — N401 Enlarged prostate with lower urinary tract symptoms: Secondary | ICD-10-CM | POA: Diagnosis not present

## 2018-04-18 DIAGNOSIS — R3915 Urgency of urination: Secondary | ICD-10-CM | POA: Diagnosis not present

## 2018-04-18 DIAGNOSIS — N200 Calculus of kidney: Secondary | ICD-10-CM | POA: Diagnosis not present

## 2018-10-01 DIAGNOSIS — L603 Nail dystrophy: Secondary | ICD-10-CM | POA: Diagnosis not present

## 2018-10-01 DIAGNOSIS — D1801 Hemangioma of skin and subcutaneous tissue: Secondary | ICD-10-CM | POA: Diagnosis not present

## 2018-10-01 DIAGNOSIS — L821 Other seborrheic keratosis: Secondary | ICD-10-CM | POA: Diagnosis not present

## 2018-10-01 DIAGNOSIS — L718 Other rosacea: Secondary | ICD-10-CM | POA: Diagnosis not present

## 2018-10-01 DIAGNOSIS — L570 Actinic keratosis: Secondary | ICD-10-CM | POA: Diagnosis not present

## 2018-10-01 DIAGNOSIS — L814 Other melanin hyperpigmentation: Secondary | ICD-10-CM | POA: Diagnosis not present

## 2018-10-01 DIAGNOSIS — D2362 Other benign neoplasm of skin of left upper limb, including shoulder: Secondary | ICD-10-CM | POA: Diagnosis not present

## 2018-10-04 ENCOUNTER — Other Ambulatory Visit: Payer: Self-pay

## 2018-10-04 ENCOUNTER — Encounter: Payer: Self-pay | Admitting: Family Medicine

## 2018-10-04 ENCOUNTER — Ambulatory Visit (INDEPENDENT_AMBULATORY_CARE_PROVIDER_SITE_OTHER): Payer: Medicare Other | Admitting: Family Medicine

## 2018-10-04 DIAGNOSIS — M5442 Lumbago with sciatica, left side: Secondary | ICD-10-CM | POA: Diagnosis not present

## 2018-10-04 MED ORDER — TRAMADOL HCL 50 MG PO TABS: 100.0000 mg | ORAL_TABLET | Freq: Four times a day (QID) | ORAL | 0 refills | Status: DC | PRN

## 2018-10-04 MED ORDER — METHYLPREDNISOLONE 4 MG PO TBPK: ORAL_TABLET | ORAL | 0 refills | Status: DC

## 2018-10-04 NOTE — Progress Notes (Signed)
   Subjective:    Patient ID: Alex Weiss, male    DOB: February 01, 1941, 78 y.o.   MRN: [id number]  HPI Virtual Visit via Telephone Note  I connected with the patient on 10/04/18 at 10:45 AM EDT by telephone and verified that I am speaking with the correct person using two identifiers. We attempted to connect virtually but we had technical difficulties with the audio and video.     I discussed the limitations, risks, security and privacy concerns of performing an evaluation and management service by telephone and the availability of in person appointments. I also discussed with the patient that there may be a patient responsible charge related to this service. The patient expressed understanding and agreed to proceed.  Location patient: home Location provider: work or home office Participants present for the call: patient, provider Patient did not have a visit in the prior 7 days to address this/these issue(s).   History of Present Illness: Here for 5 days of a sharp pain in the left lower back that radiates down the left leg. No numbness or weakness. This started one morning as he got out of bed. No recent trauma. He has tried heat, ice, Ibuprofen, and Skelaxin with little benefit.    Observations/Objective: Patient sounds cheerful and well on the phone. I do not appreciate any SOB. Speech and thought processing are grossly intact. Patient reported vitals:  Assessment and Plan: Low back pain. Given a Medrol dose pack and Tramadol to use prn. He is coming in for a well exam next week, and we will follow this up at that time.  Alysia Penna, MD   Follow Up Instructions:     [phone] 5-10 [phone] 11-20 [phone] 21-30 I did not refer this patient for an OV in the next 24 hours for this/these issue(s).  I discussed the assessment and treatment plan with the patient. The patient was provided an opportunity to ask questions and all were answered. The patient agreed with the plan and demonstrated  an understanding of the instructions.   The patient was advised to call back or seek an in-person evaluation if the symptoms worsen or if the condition fails to improve as anticipated.  I provided 13 minutes of non-face-to-face time during this encounter.   Alysia Penna, MD    Review of Systems     Objective:   Physical Exam        Assessment & Plan:

## 2018-10-11 ENCOUNTER — Encounter: Payer: Self-pay | Admitting: Family Medicine

## 2018-10-11 ENCOUNTER — Ambulatory Visit (INDEPENDENT_AMBULATORY_CARE_PROVIDER_SITE_OTHER): Payer: Medicare Other | Admitting: Family Medicine

## 2018-10-11 ENCOUNTER — Other Ambulatory Visit: Payer: Self-pay

## 2018-10-11 VITALS — BP 160/78 | HR 75 | Temp 98.5°F | Ht 72.0 in | Wt 210.5 lb

## 2018-10-11 DIAGNOSIS — M5432 Sciatica, left side: Secondary | ICD-10-CM

## 2018-10-11 DIAGNOSIS — R739 Hyperglycemia, unspecified: Secondary | ICD-10-CM | POA: Diagnosis not present

## 2018-10-11 DIAGNOSIS — Z0000 Encounter for general adult medical examination without abnormal findings: Secondary | ICD-10-CM | POA: Diagnosis not present

## 2018-10-11 LAB — BASIC METABOLIC PANEL
BUN: 26 mg/dL — ABNORMAL HIGH (ref 6–23)
CO2: 34 mEq/L — ABNORMAL HIGH (ref 19–32)
Calcium: 9.4 mg/dL (ref 8.4–10.5)
Chloride: 101 mEq/L (ref 96–112)
Creatinine, Ser: 0.99 mg/dL (ref 0.40–1.50)
GFR: 73.08 mL/min (ref >60.00)
Glucose, Bld: 89 mg/dL (ref 70–99)
Potassium: 5.2 mEq/L — ABNORMAL HIGH (ref 3.5–5.1)
Sodium: 139 mEq/L (ref 135–145)

## 2018-10-11 LAB — HEPATIC FUNCTION PANEL
ALT: 18 U/L (ref 0–53)
AST: 18 U/L (ref 0–37)
Albumin: 4.7 g/dL (ref 3.5–5.2)
Alkaline Phosphatase: 72 U/L (ref 39–117)
Bilirubin, Direct: 0.1 mg/dL (ref 0.0–0.3)
Total Bilirubin: 0.9 mg/dL (ref 0.2–1.2)
Total Protein: 6.9 g/dL (ref 6.0–8.3)

## 2018-10-11 LAB — POC URINALSYSI DIPSTICK (AUTOMATED)
Bilirubin, UA: NEGATIVE
Blood, UA: NEGATIVE
Glucose, UA: NEGATIVE
Ketones, UA: NEGATIVE
Leukocytes, UA: NEGATIVE
Nitrite, UA: NEGATIVE
Protein, UA: NEGATIVE
Spec Grav, UA: 1.015 (ref 1.010–1.025)
Urobilinogen, UA: 0.2 E.U./dL
pH, UA: 7 (ref 5.0–8.0)

## 2018-10-11 LAB — LIPID PANEL
Cholesterol: 173 mg/dL (ref 0–200)
HDL: 50.5 mg/dL (ref >39.00)
LDL Cholesterol: 102 mg/dL — ABNORMAL HIGH (ref 0–99)
NonHDL: 122.91
Total CHOL/HDL Ratio: 3
Triglycerides: 105 mg/dL (ref 0.0–149.0)
VLDL: 21 mg/dL (ref 0.0–40.0)

## 2018-10-11 LAB — CBC WITH DIFFERENTIAL/PLATELET
Basophils Absolute: 0 10*3/uL (ref 0.0–0.1)
Basophils Relative: 0.4 % (ref 0.0–3.0)
Eosinophils Absolute: 0.3 10*3/uL (ref 0.0–0.7)
Eosinophils Relative: 5.1 % — ABNORMAL HIGH (ref 0.0–5.0)
HCT: 48.3 % (ref 39.0–52.0)
Hemoglobin: 16.6 g/dL (ref 13.0–17.0)
Lymphocytes Relative: 18 % (ref 12.0–46.0)
Lymphs Abs: 1 10*3/uL (ref 0.7–4.0)
MCHC: 34.4 g/dL (ref 30.0–36.0)
MCV: 94.5 fl (ref 78.0–100.0)
Monocytes Absolute: 0.6 10*3/uL (ref 0.1–1.0)
Monocytes Relative: 10 % (ref 3.0–12.0)
Neutro Abs: 3.9 10*3/uL (ref 1.4–7.7)
Neutrophils Relative %: 66.5 % (ref 43.0–77.0)
Platelets: 147 10*3/uL — ABNORMAL LOW (ref 150.0–400.0)
RBC: 5.12 Mil/uL (ref 4.22–5.81)
RDW: 12.7 % (ref 11.5–15.5)
WBC: 5.8 10*3/uL (ref 4.0–10.5)

## 2018-10-11 LAB — PSA: PSA: 1.72 ng/mL (ref 0.10–4.00)

## 2018-10-11 LAB — HEMOGLOBIN A1C: Hgb A1c MFr Bld: 5.4 % (ref 4.6–6.5)

## 2018-10-11 LAB — TSH: TSH: 1.49 u[IU]/mL (ref 0.35–4.50)

## 2018-10-11 MED ORDER — TRIAMCINOLONE ACETONIDE 0.1 % EX CREA: 1.0000 "application " | TOPICAL_CREAM | Freq: Two times a day (BID) | CUTANEOUS | 5 refills | Status: DC | PRN

## 2018-10-11 MED ORDER — METAXALONE 400 MG PO TABS: 800.0000 mg | ORAL_TABLET | Freq: Three times a day (TID) | ORAL | 0 refills | Status: DC | PRN

## 2018-10-11 MED ORDER — LOSARTAN POTASSIUM 25 MG PO TABS: 25.0000 mg | ORAL_TABLET | Freq: Every day | ORAL | 3 refills | Status: DC

## 2018-10-11 MED ORDER — TAMSULOSIN HCL 0.4 MG PO CAPS: 0.4000 mg | ORAL_CAPSULE | Freq: Every day | ORAL | 3 refills | Status: DC

## 2018-10-11 MED ORDER — TRAMADOL HCL 50 MG PO TABS: 100.0000 mg | ORAL_TABLET | Freq: Four times a day (QID) | ORAL | 5 refills | Status: DC | PRN

## 2018-10-11 MED ORDER — SULINDAC 200 MG PO TABS: 200.0000 mg | ORAL_TABLET | Freq: Every day | ORAL | 3 refills | Status: DC

## 2018-10-11 MED ORDER — METAXALONE 400 MG PO TABS: 800.0000 mg | ORAL_TABLET | Freq: Three times a day (TID) | ORAL | 1 refills | Status: DC | PRN

## 2018-10-11 NOTE — Progress Notes (Signed)
Subjective:    Patient ID: Alex Weiss, male    DOB: Oct 13, 1940, 78 y.o.   MRN: [id number]  HPI Here for a well exam. His only concern is left sided low back pain that radiates down the back of the left leg to the knee. He has numbness in the thigh but no leg weakness. No recent trauma. This pain started 2 weeks ago. We called in a Medrol dose pack last week but this did not help.    Review of Systems  Constitutional: Negative.   HENT: Negative.   Eyes: Negative.   Respiratory: Negative.   Cardiovascular: Negative.   Gastrointestinal: Negative.   Genitourinary: Negative.   Musculoskeletal: Positive for back pain.  Skin: Negative.   Neurological: Positive for numbness. Negative for weakness.  Psychiatric/Behavioral: Negative.        Objective:   Physical Exam Constitutional:      General: He is not in acute distress.    Appearance: He is well-developed. He is not diaphoretic.  HENT:     Head: Normocephalic and atraumatic.     Right Ear: External ear normal.     Left Ear: External ear normal.     Nose: Nose normal.     Mouth/Throat:     Pharynx: No oropharyngeal exudate.  Eyes:     General: No scleral icterus.       Right eye: No discharge.        Left eye: No discharge.     Conjunctiva/sclera: Conjunctivae normal.     Pupils: Pupils are equal, round, and reactive to light.  Neck:     Musculoskeletal: Neck supple.     Thyroid: No thyromegaly.     Vascular: No JVD.     Trachea: No tracheal deviation.  Cardiovascular:     Rate and Rhythm: Normal rate and regular rhythm.     Heart sounds: Normal heart sounds. No murmur. No friction rub. No gallop.   Pulmonary:     Effort: Pulmonary effort is normal. No respiratory distress.     Breath sounds: Normal breath sounds. No wheezing or rales.  Chest:     Chest wall: No tenderness.  Abdominal:     General: Bowel sounds are normal. There is no distension.     Palpations: Abdomen is soft. There is no mass.   Tenderness: There is no abdominal tenderness. There is no guarding or rebound.  Genitourinary:    Penis: No tenderness.   Musculoskeletal: Normal range of motion.     Comments: He is tender in the left lower back and the left sciatic notch. ROM is limited by pain. SLR are negative  Lymphadenopathy:     Cervical: No cervical adenopathy.  Skin:    General: Skin is warm and dry.     Coloration: Skin is not pale.     Findings: No erythema or rash.  Neurological:     Mental Status: He is alert and oriented to person, place, and time.     Cranial Nerves: No cranial nerve deficit.     Motor: No abnormal muscle tone.     Coordination: Coordination normal.     Deep Tendon Reflexes: Reflexes are normal and symmetric. Reflexes normal.  Psychiatric:        Behavior: Behavior normal.        Thought Content: Thought content normal.        Judgment: Judgment normal.           Assessment & Plan:  Well  exam. We discussed diet and exercise. Get fasting labs. We will set up an MRI of the lumbar spine soon to evaluate the back pain.  Alysia Penna, MD

## 2018-10-12 ENCOUNTER — Encounter: Payer: Self-pay | Admitting: *Deleted

## 2018-11-08 ENCOUNTER — Other Ambulatory Visit: Payer: Medicare Other

## 2018-11-09 ENCOUNTER — Ambulatory Visit
Admission: RE | Admit: 2018-11-09 | Discharge: 2018-11-09 | Disposition: A | Discharge status: Home / Self Care | Payer: Medicare Other | Source: Ambulatory Visit | Attending: Family Medicine | Admitting: Family Medicine

## 2018-11-09 ENCOUNTER — Other Ambulatory Visit: Payer: Self-pay

## 2018-11-09 DIAGNOSIS — M5432 Sciatica, left side: Secondary | ICD-10-CM

## 2018-11-09 DIAGNOSIS — M48061 Spinal stenosis, lumbar region without neurogenic claudication: Secondary | ICD-10-CM | POA: Diagnosis not present

## 2018-11-12 ENCOUNTER — Encounter: Payer: Self-pay | Admitting: Family Medicine

## 2018-11-12 NOTE — Addendum Note (Signed)
Addended by: Alysia Penna A on: 11/12/2018 07:43 AM   Modules accepted: Orders

## 2018-11-20 DIAGNOSIS — M5126 Other intervertebral disc displacement, lumbar region: Secondary | ICD-10-CM | POA: Diagnosis not present

## 2018-12-03 DIAGNOSIS — G8929 Other chronic pain: Secondary | ICD-10-CM | POA: Diagnosis not present

## 2018-12-03 DIAGNOSIS — M2560 Stiffness of unspecified joint, not elsewhere classified: Secondary | ICD-10-CM | POA: Diagnosis not present

## 2018-12-03 DIAGNOSIS — R2689 Other abnormalities of gait and mobility: Secondary | ICD-10-CM | POA: Diagnosis not present

## 2018-12-03 DIAGNOSIS — M5126 Other intervertebral disc displacement, lumbar region: Secondary | ICD-10-CM | POA: Diagnosis not present

## 2018-12-11 DIAGNOSIS — Z23 Encounter for immunization: Secondary | ICD-10-CM | POA: Diagnosis not present

## 2018-12-13 DIAGNOSIS — R2689 Other abnormalities of gait and mobility: Secondary | ICD-10-CM | POA: Diagnosis not present

## 2018-12-13 DIAGNOSIS — G8929 Other chronic pain: Secondary | ICD-10-CM | POA: Diagnosis not present

## 2018-12-13 DIAGNOSIS — M5126 Other intervertebral disc displacement, lumbar region: Secondary | ICD-10-CM | POA: Diagnosis not present

## 2018-12-13 DIAGNOSIS — M2560 Stiffness of unspecified joint, not elsewhere classified: Secondary | ICD-10-CM | POA: Diagnosis not present

## 2018-12-17 DIAGNOSIS — R2689 Other abnormalities of gait and mobility: Secondary | ICD-10-CM | POA: Diagnosis not present

## 2018-12-17 DIAGNOSIS — G8929 Other chronic pain: Secondary | ICD-10-CM | POA: Diagnosis not present

## 2018-12-17 DIAGNOSIS — M5126 Other intervertebral disc displacement, lumbar region: Secondary | ICD-10-CM | POA: Diagnosis not present

## 2018-12-17 DIAGNOSIS — M2560 Stiffness of unspecified joint, not elsewhere classified: Secondary | ICD-10-CM | POA: Diagnosis not present

## 2018-12-18 DIAGNOSIS — M5126 Other intervertebral disc displacement, lumbar region: Secondary | ICD-10-CM | POA: Diagnosis not present

## 2018-12-19 DIAGNOSIS — M5126 Other intervertebral disc displacement, lumbar region: Secondary | ICD-10-CM | POA: Diagnosis not present

## 2018-12-19 DIAGNOSIS — M2560 Stiffness of unspecified joint, not elsewhere classified: Secondary | ICD-10-CM | POA: Diagnosis not present

## 2018-12-19 DIAGNOSIS — R2689 Other abnormalities of gait and mobility: Secondary | ICD-10-CM | POA: Diagnosis not present

## 2018-12-19 DIAGNOSIS — G8929 Other chronic pain: Secondary | ICD-10-CM | POA: Diagnosis not present

## 2018-12-24 DIAGNOSIS — R2689 Other abnormalities of gait and mobility: Secondary | ICD-10-CM | POA: Diagnosis not present

## 2018-12-24 DIAGNOSIS — G8929 Other chronic pain: Secondary | ICD-10-CM | POA: Diagnosis not present

## 2018-12-24 DIAGNOSIS — M2560 Stiffness of unspecified joint, not elsewhere classified: Secondary | ICD-10-CM | POA: Diagnosis not present

## 2018-12-24 DIAGNOSIS — L718 Other rosacea: Secondary | ICD-10-CM | POA: Diagnosis not present

## 2018-12-24 DIAGNOSIS — M5126 Other intervertebral disc displacement, lumbar region: Secondary | ICD-10-CM | POA: Diagnosis not present

## 2018-12-26 DIAGNOSIS — G8929 Other chronic pain: Secondary | ICD-10-CM | POA: Diagnosis not present

## 2018-12-26 DIAGNOSIS — R2689 Other abnormalities of gait and mobility: Secondary | ICD-10-CM | POA: Diagnosis not present

## 2018-12-26 DIAGNOSIS — M5126 Other intervertebral disc displacement, lumbar region: Secondary | ICD-10-CM | POA: Diagnosis not present

## 2018-12-26 DIAGNOSIS — M2560 Stiffness of unspecified joint, not elsewhere classified: Secondary | ICD-10-CM | POA: Diagnosis not present

## 2019-01-09 DIAGNOSIS — M5126 Other intervertebral disc displacement, lumbar region: Secondary | ICD-10-CM | POA: Diagnosis not present

## 2019-01-09 DIAGNOSIS — G8929 Other chronic pain: Secondary | ICD-10-CM | POA: Diagnosis not present

## 2019-01-09 DIAGNOSIS — M2560 Stiffness of unspecified joint, not elsewhere classified: Secondary | ICD-10-CM | POA: Diagnosis not present

## 2019-01-09 DIAGNOSIS — R2689 Other abnormalities of gait and mobility: Secondary | ICD-10-CM | POA: Diagnosis not present

## 2019-01-11 DIAGNOSIS — R2689 Other abnormalities of gait and mobility: Secondary | ICD-10-CM | POA: Diagnosis not present

## 2019-01-11 DIAGNOSIS — M2560 Stiffness of unspecified joint, not elsewhere classified: Secondary | ICD-10-CM | POA: Diagnosis not present

## 2019-01-11 DIAGNOSIS — G8929 Other chronic pain: Secondary | ICD-10-CM | POA: Diagnosis not present

## 2019-01-11 DIAGNOSIS — M5126 Other intervertebral disc displacement, lumbar region: Secondary | ICD-10-CM | POA: Diagnosis not present

## 2019-01-14 DIAGNOSIS — M5126 Other intervertebral disc displacement, lumbar region: Secondary | ICD-10-CM | POA: Diagnosis not present

## 2019-01-14 DIAGNOSIS — R2689 Other abnormalities of gait and mobility: Secondary | ICD-10-CM | POA: Diagnosis not present

## 2019-01-14 DIAGNOSIS — M2560 Stiffness of unspecified joint, not elsewhere classified: Secondary | ICD-10-CM | POA: Diagnosis not present

## 2019-01-14 DIAGNOSIS — G8929 Other chronic pain: Secondary | ICD-10-CM | POA: Diagnosis not present

## 2019-01-16 DIAGNOSIS — M2560 Stiffness of unspecified joint, not elsewhere classified: Secondary | ICD-10-CM | POA: Diagnosis not present

## 2019-01-16 DIAGNOSIS — M5126 Other intervertebral disc displacement, lumbar region: Secondary | ICD-10-CM | POA: Diagnosis not present

## 2019-01-16 DIAGNOSIS — R2689 Other abnormalities of gait and mobility: Secondary | ICD-10-CM | POA: Diagnosis not present

## 2019-01-16 DIAGNOSIS — G8929 Other chronic pain: Secondary | ICD-10-CM | POA: Diagnosis not present

## 2019-01-21 DIAGNOSIS — G8929 Other chronic pain: Secondary | ICD-10-CM | POA: Diagnosis not present

## 2019-01-21 DIAGNOSIS — M2560 Stiffness of unspecified joint, not elsewhere classified: Secondary | ICD-10-CM | POA: Diagnosis not present

## 2019-01-21 DIAGNOSIS — R2689 Other abnormalities of gait and mobility: Secondary | ICD-10-CM | POA: Diagnosis not present

## 2019-01-21 DIAGNOSIS — M5126 Other intervertebral disc displacement, lumbar region: Secondary | ICD-10-CM | POA: Diagnosis not present

## 2019-02-19 DIAGNOSIS — M5126 Other intervertebral disc displacement, lumbar region: Secondary | ICD-10-CM | POA: Diagnosis not present

## 2019-04-01 ENCOUNTER — Ambulatory Visit: Payer: Self-pay | Admitting: *Deleted

## 2019-04-01 NOTE — Telephone Encounter (Signed)
Summary: medication   Patient went to dentist this afternoon, and has questions about the medication he was prescribed. Patient was wondering about medication kelfelx. Wants to know if he is allergic to this medication  Patient call back [phone]      Call to patient- patient wants to make sure Keflex is ok to use before his dentist prescribes it. Patient notified in different drug class than both the antibiotics on his list- will check with PCP for him.  Reason for Disposition . [1] Caller has URGENT medication question about med that PCP or specialist prescribed AND [2] triager unable to answer question  Answer Assessment - Initial Assessment Questions 1.   NAME of MEDICATION: "What medicine are you calling about?"     Keflex 2.   QUESTION: "What is your question?"     Can patient use Keflex after his dental procsdure 3.   PRESCRIBING HCP: "Who prescribed it?" Reason: if prescribed by specialist, call should be referred to that group.     Dentist wants to make sure PCP is Ok with Rx before prescribing for patient. 4. SYMPTOMS: "Do you have any symptoms?"     Dental procedure today  Protocols used: MEDICATION QUESTION CALL-A-AH

## 2019-04-02 ENCOUNTER — Telehealth: Payer: Self-pay | Admitting: *Deleted

## 2019-04-02 NOTE — Telephone Encounter (Signed)
Please refer to triage note from 04/01/2019

## 2019-04-02 NOTE — Telephone Encounter (Signed)
Returned call to patient and he confirmed that he spoke with the nurse and stated that it was okay for him to take medication.

## 2019-04-02 NOTE — Telephone Encounter (Signed)
Spoke with patient. Informed him per record he is allergic to Penicillin, Clindamycin, and  IVP dye. Patient verbalized understanding.

## 2019-04-02 NOTE — Telephone Encounter (Signed)
Copied from [place] [phone]. Topic: General - Other >> Apr 02, 2019 11:20 AM Rayann Heman wrote: Reason for CRM: pt calling back about medication Keflex and if it is okay to take. Please see nurse triage note from 04/01/19. Please advise

## 2019-04-18 ENCOUNTER — Ambulatory Visit: Payer: Medicare Other | Attending: Internal Medicine

## 2019-04-18 DIAGNOSIS — Z23 Encounter for immunization: Secondary | ICD-10-CM | POA: Diagnosis not present

## 2019-04-18 NOTE — Progress Notes (Signed)
   Covid-19 Vaccination Clinic  Name:  Alex Weiss    MRN: [id number] DOB: 1941-03-06  04/18/2019  [name] was observed post Covid-19 immunization  30 minutes based on pre-vaccination screening without incidence. He was provided with Vaccine Information Sheet and instruction to access the V-Safe system.   [name] was instructed to call 911 with any severe reactions post vaccine: Marland Kitchen Difficulty breathing  . Swelling of your face and throat  . A fast heartbeat  . A bad rash all over your body  . Dizziness and weakness    Immunizations Administered    Name Date Dose VIS Date Route   Pfizer COVID-19 Vaccine 04/18/2019  8:45 AM 0.3 mL 03/15/2019 Intramuscular   Manufacturer: [employer]   Lot: [id number]   [place]: [id number]

## 2019-05-08 ENCOUNTER — Ambulatory Visit: Payer: Medicare Other | Attending: Internal Medicine

## 2019-05-08 DIAGNOSIS — Z23 Encounter for immunization: Secondary | ICD-10-CM

## 2019-05-08 NOTE — Progress Notes (Signed)
   Covid-19 Vaccination Clinic  Name:  DEEP SIMERSON    MRN: [id number] DOB: 21-Dec-1940  05/08/2019  Mr. Fieser was observed post Covid-19 immunization for 15 minutes without incidence. He was provided with Vaccine Information Sheet and instruction to access the V-Safe system.   Mr. Markiewicz was instructed to call 911 with any severe reactions post vaccine: Marland Kitchen Difficulty breathing  . Swelling of your face and throat  . A fast heartbeat  . A bad rash all over your body  . Dizziness and weakness    Immunizations Administered    Name Date Dose VIS Date Route   Pfizer COVID-19 Vaccine 05/08/2019  9:31 AM 0.3 mL 03/15/2019 Intramuscular   Manufacturer: [place]   Lot: [id number]   [place]: [id number]

## 2019-10-14 ENCOUNTER — Other Ambulatory Visit: Payer: Self-pay

## 2019-10-14 ENCOUNTER — Telehealth: Payer: Self-pay | Admitting: Family Medicine

## 2019-10-14 ENCOUNTER — Ambulatory Visit (INDEPENDENT_AMBULATORY_CARE_PROVIDER_SITE_OTHER): Payer: Medicare Other | Admitting: Family Medicine

## 2019-10-14 ENCOUNTER — Encounter: Payer: Self-pay | Admitting: Family Medicine

## 2019-10-14 VITALS — BP 160/80 | HR 69 | Temp 98.2°F | Wt 212.2 lb

## 2019-10-14 DIAGNOSIS — R739 Hyperglycemia, unspecified: Secondary | ICD-10-CM

## 2019-10-14 DIAGNOSIS — N401 Enlarged prostate with lower urinary tract symptoms: Secondary | ICD-10-CM

## 2019-10-14 DIAGNOSIS — E782 Mixed hyperlipidemia: Secondary | ICD-10-CM

## 2019-10-14 DIAGNOSIS — M159 Polyosteoarthritis, unspecified: Secondary | ICD-10-CM

## 2019-10-14 DIAGNOSIS — Z0000 Encounter for general adult medical examination without abnormal findings: Secondary | ICD-10-CM | POA: Diagnosis not present

## 2019-10-14 DIAGNOSIS — I10 Essential (primary) hypertension: Secondary | ICD-10-CM | POA: Diagnosis not present

## 2019-10-14 DIAGNOSIS — M8949 Other hypertrophic osteoarthropathy, multiple sites: Secondary | ICD-10-CM | POA: Diagnosis not present

## 2019-10-14 DIAGNOSIS — K5190 Ulcerative colitis, unspecified, without complications: Secondary | ICD-10-CM

## 2019-10-14 DIAGNOSIS — N138 Other obstructive and reflux uropathy: Secondary | ICD-10-CM | POA: Diagnosis not present

## 2019-10-14 LAB — POCT URINALYSIS DIPSTICK
Bilirubin, UA: NEGATIVE
Glucose, UA: NEGATIVE
Ketones, UA: NEGATIVE
Leukocytes, UA: NEGATIVE
Nitrite, UA: NEGATIVE
Protein, UA: POSITIVE — AB
Spec Grav, UA: 1.025 (ref 1.010–1.025)
Urobilinogen, UA: 0.2 E.U./dL
pH, UA: 6 (ref 5.0–8.0)

## 2019-10-14 MED ORDER — LOSARTAN POTASSIUM 25 MG PO TABS: 25.0000 mg | ORAL_TABLET | Freq: Every day | ORAL | 3 refills | Status: DC

## 2019-10-14 MED ORDER — TRIAMCINOLONE ACETONIDE 0.1 % EX CREA: 1.0000 "application " | TOPICAL_CREAM | Freq: Two times a day (BID) | CUTANEOUS | 5 refills | Status: DC | PRN

## 2019-10-14 MED ORDER — SULINDAC 200 MG PO TABS: 200.0000 mg | ORAL_TABLET | Freq: Every day | ORAL | 3 refills | Status: DC

## 2019-10-14 MED ORDER — METAXALONE 400 MG PO TABS: 800.0000 mg | ORAL_TABLET | Freq: Three times a day (TID) | ORAL | 3 refills | Status: DC | PRN

## 2019-10-14 MED ORDER — TAMSULOSIN HCL 0.4 MG PO CAPS: 0.4000 mg | ORAL_CAPSULE | Freq: Every day | ORAL | 3 refills | Status: DC

## 2019-10-14 NOTE — Addendum Note (Signed)
Addended by: Marrion Coy on: 10/14/2019 09:11 AM   Modules accepted: Orders

## 2019-10-14 NOTE — Telephone Encounter (Signed)
sulindac (CLINORIL) 200 MG tablet  [place], [place] Phone:  [phone]  Fax:  [phone]

## 2019-10-14 NOTE — Telephone Encounter (Signed)
Rx sent in. Spoke with the patient and he is aware.  °

## 2019-10-14 NOTE — Addendum Note (Signed)
Addended by: Alysia Penna A on: 10/14/2019 08:45 AM   Modules accepted: Orders

## 2019-10-14 NOTE — Progress Notes (Signed)
Subjective:    Patient ID: Alex Weiss, male    DOB: [birth year], 79 y.o.   MRN: [id number]  HPI Here to follow up on issues. He feels well except for left sided low back pain. He has been seeing Dr. Saintclair Halsted for this and he has been to PT. They are trying to avoid surgery if possible. Otherwise he stays active, his BP is stable. He has a large pelvic kidney stone but it has not caused him any trouble.    Review of Systems  Constitutional: Negative.   HENT: Negative.   Eyes: Negative.   Respiratory: Negative.   Cardiovascular: Negative.   Gastrointestinal: Negative.   Genitourinary: Negative.   Musculoskeletal: Positive for back pain.  Skin: Negative.   Neurological: Negative.   Psychiatric/Behavioral: Negative.        Objective:   Physical Exam Constitutional:      General: He is not in acute distress.    Appearance: He is well-developed. He is not diaphoretic.  HENT:     Head: Normocephalic and atraumatic.     Right Ear: External ear normal.     Left Ear: External ear normal.     Nose: Nose normal.     Mouth/Throat:     Pharynx: No oropharyngeal exudate.  Eyes:     General: No scleral icterus.       Right eye: No discharge.        Left eye: No discharge.     Conjunctiva/sclera: Conjunctivae normal.     Pupils: Pupils are equal, round, and reactive to light.  Neck:     Thyroid: No thyromegaly.     Vascular: No JVD.     Trachea: No tracheal deviation.  Cardiovascular:     Rate and Rhythm: Normal rate and regular rhythm.     Heart sounds: Normal heart sounds. No murmur heard.  No friction rub. No gallop.   Pulmonary:     Effort: Pulmonary effort is normal. No respiratory distress.     Breath sounds: Normal breath sounds. No wheezing or rales.  Chest:     Chest wall: No tenderness.  Abdominal:     General: Bowel sounds are normal. There is no distension.     Palpations: Abdomen is soft. There is no mass.     Tenderness: There is no abdominal tenderness. There is  no guarding or rebound.  Genitourinary:    Penis: Normal. No tenderness.      Testes: Normal.     Prostate: Normal.     Rectum: Normal. Guaiac result negative.  Musculoskeletal:        General: No tenderness. Normal range of motion.     Cervical back: Neck supple.  Lymphadenopathy:     Cervical: No cervical adenopathy.  Skin:    General: Skin is warm and dry.     Coloration: Skin is not pale.     Findings: No erythema or rash.  Neurological:     Mental Status: He is alert and oriented to person, place, and time.     Cranial Nerves: No cranial nerve deficit.     Motor: No abnormal muscle tone.     Coordination: Coordination normal.     Deep Tendon Reflexes: Reflexes are normal and symmetric. Reflexes normal.  Psychiatric:        Behavior: Behavior normal.        Thought Content: Thought content normal.        Judgment: Judgment normal.  Assessment & Plan:  His HTN and UC are stable. He will follow up with Dr. Saintclair Halsted for the back pain. Get fasting labs today to check lipids, A1c, etc.  Alysia Penna, MD

## 2019-10-14 NOTE — Addendum Note (Signed)
Addended by: Rebecca Eaton on: 10/14/2019 11:15 AM   Modules accepted: Orders

## 2019-10-15 ENCOUNTER — Encounter: Payer: Self-pay | Admitting: Gastroenterology

## 2019-10-15 LAB — CBC WITH DIFFERENTIAL/PLATELET
Absolute Monocytes: 382 cells/uL (ref 200–950)
Basophils Absolute: 20 cells/uL (ref 0–200)
Basophils Relative: 0.5 %
Eosinophils Absolute: 203 cells/uL (ref 15–500)
Eosinophils Relative: 5.2 %
HCT: 49.1 % (ref 38.5–50.0)
Hemoglobin: 16.5 g/dL (ref 13.2–17.1)
Lymphs Abs: 745 cells/uL — ABNORMAL LOW (ref 850–3900)
MCH: 32.3 pg (ref 27.0–33.0)
MCHC: 33.6 g/dL (ref 32.0–36.0)
MCV: 96.1 fL (ref 80.0–100.0)
MPV: 10.9 fL (ref 7.5–12.5)
Monocytes Relative: 9.8 %
Neutro Abs: 2551 cells/uL (ref 1500–7800)
Neutrophils Relative %: 65.4 %
Platelets: 135 10*3/uL — ABNORMAL LOW (ref 140–400)
RBC: 5.11 10*6/uL (ref 4.20–5.80)
RDW: 12 % (ref 11.0–15.0)
Total Lymphocyte: 19.1 %
WBC: 3.9 10*3/uL (ref 3.8–10.8)

## 2019-10-15 LAB — BASIC METABOLIC PANEL
BUN/Creatinine Ratio: 28 (calc) — ABNORMAL HIGH (ref 6–22)
BUN: 26 mg/dL — ABNORMAL HIGH (ref 7–25)
CO2: 31 mmol/L (ref 20–32)
Calcium: 9.5 mg/dL (ref 8.6–10.3)
Chloride: 105 mmol/L (ref 98–110)
Creat: 0.94 mg/dL (ref 0.70–1.18)
Glucose, Bld: 106 mg/dL — ABNORMAL HIGH (ref 65–99)
Potassium: 4.6 mmol/L (ref 3.5–5.3)
Sodium: 141 mmol/L (ref 135–146)

## 2019-10-15 LAB — HEPATIC FUNCTION PANEL
AG Ratio: 2 (calc) (ref 1.0–2.5)
ALT: 14 U/L (ref 9–46)
AST: 16 U/L (ref 10–35)
Albumin: 4.4 g/dL (ref 3.6–5.1)
Alkaline phosphatase (APISO): 80 U/L (ref 35–144)
Bilirubin, Direct: 0.1 mg/dL (ref 0.0–0.2)
Globulin: 2.2 g/dL (calc) (ref 1.9–3.7)
Indirect Bilirubin: 0.5 mg/dL (calc) (ref 0.2–1.2)
Total Bilirubin: 0.6 mg/dL (ref 0.2–1.2)
Total Protein: 6.6 g/dL (ref 6.1–8.1)

## 2019-10-15 LAB — LIPID PANEL
Cholesterol: 175 mg/dL (ref <200)
HDL: 49 mg/dL (ref >=40)
LDL Cholesterol (Calc): 110 mg/dL (calc) — ABNORMAL HIGH
Non-HDL Cholesterol (Calc): 126 mg/dL (calc) (ref <130)
Total CHOL/HDL Ratio: 3.6 (calc) (ref <5.0)
Triglycerides: 69 mg/dL (ref <150)

## 2019-10-15 LAB — HEMOGLOBIN A1C
Hgb A1c MFr Bld: 5.3 % of total Hgb (ref <5.7)
Mean Plasma Glucose: 105 (calc)
eAG (mmol/L): 5.8 (calc)

## 2019-10-15 LAB — PSA: PSA: 1.5 ng/mL (ref <=4.0)

## 2019-10-15 LAB — TSH: TSH: 1.49 mIU/L (ref 0.40–4.50)

## 2019-10-22 ENCOUNTER — Encounter: Payer: Self-pay | Admitting: Family Medicine

## 2019-11-11 ENCOUNTER — Other Ambulatory Visit: Payer: Self-pay | Admitting: *Deleted

## 2019-11-11 MED ORDER — SULINDAC 200 MG PO TABS: 200.0000 mg | ORAL_TABLET | Freq: Every day | ORAL | 3 refills | Status: DC

## 2019-11-25 ENCOUNTER — Telehealth: Payer: Self-pay | Admitting: Family Medicine

## 2019-11-25 DIAGNOSIS — Z20828 Contact with and (suspected) exposure to other viral communicable diseases: Secondary | ICD-10-CM | POA: Diagnosis not present

## 2019-11-25 NOTE — Telephone Encounter (Signed)
Pt is calling in stating that he sat beside someone at lunch last Tuesday and that the person he sat beside him and his spouse both tested positive for COVID and all parties are fully vaccinated with the [place] vaccine.  Pt stated that he will be going today to get tested for COVID today at 12:45 at [facility] and wanted to know if this was okay with the provider to do.  Pt would like to have a call back.

## 2019-11-25 NOTE — Telephone Encounter (Signed)
Please advise 

## 2019-11-25 NOTE — Telephone Encounter (Signed)
Yes it is a good idea that Alex Weiss get tested

## 2019-11-25 NOTE — Telephone Encounter (Signed)
Spoke with patient and he did go get tested and is awaiting results.

## 2019-11-28 ENCOUNTER — Encounter: Payer: Self-pay | Admitting: Family Medicine

## 2019-11-29 ENCOUNTER — Other Ambulatory Visit: Payer: Self-pay

## 2019-11-29 ENCOUNTER — Ambulatory Visit (AMBULATORY_SURGERY_CENTER): Payer: Self-pay | Admitting: *Deleted

## 2019-11-29 VITALS — Ht 75.0 in | Wt 211.0 lb

## 2019-11-29 DIAGNOSIS — Z800 Family history of malignant neoplasm of digestive organs: Secondary | ICD-10-CM

## 2019-11-29 NOTE — Telephone Encounter (Signed)
FYI

## 2019-11-29 NOTE — Progress Notes (Signed)
Patient is here in-person for PV. Patient denies any allergies to eggs or soy. Patient denies any problems with anesthesia/sedation. Patient denies any oxygen use at home. Patient denies taking any diet/weight loss medications or blood thinners. Patient is not being treated for MRSA or C-diff. Patient is aware of our care-partner policy and FDVOU-51 safety protocol. COVID-19 vaccines completed per pt. On 05/08/19.

## 2019-12-13 ENCOUNTER — Ambulatory Visit (AMBULATORY_SURGERY_CENTER): Payer: Medicare Other | Admitting: Gastroenterology

## 2019-12-13 ENCOUNTER — Other Ambulatory Visit: Payer: Self-pay

## 2019-12-13 ENCOUNTER — Encounter: Payer: Self-pay | Admitting: Gastroenterology

## 2019-12-13 VITALS — BP 123/74 | HR 58 | Temp 97.6°F | Resp 14 | Ht 75.0 in | Wt 211.0 lb

## 2019-12-13 DIAGNOSIS — Z1211 Encounter for screening for malignant neoplasm of colon: Secondary | ICD-10-CM

## 2019-12-13 DIAGNOSIS — K5730 Diverticulosis of large intestine without perforation or abscess without bleeding: Secondary | ICD-10-CM | POA: Diagnosis not present

## 2019-12-13 DIAGNOSIS — Z800 Family history of malignant neoplasm of digestive organs: Secondary | ICD-10-CM | POA: Diagnosis not present

## 2019-12-13 HISTORY — PX: COLONOSCOPY: SHX174

## 2019-12-13 MED ORDER — SODIUM CHLORIDE 0.9 % IV SOLN: 500.0000 mL | Freq: Once | INTRAVENOUS | Status: DC

## 2019-12-13 NOTE — Op Note (Signed)
[place] Patient Name: Alex Weiss Procedure Date: 12/13/2019 9:45 AM MRN: [id number] Endoscopist: Milus Banister , MD Age: 79 Referring MD:  Date of Birth: 27-Aug-1940 Gender: Male Account #: [id number] Procedure:                Colonoscopy Indications:              Screening in patient at increased risk: FH of colon                            cancer, brother in his 70s Medicines:                Monitored Anesthesia Care Procedure:                Pre-Anesthesia Assessment:                           - Prior to the procedure, a History and Physical                            was performed, and patient medications and                            allergies were reviewed. The patient's tolerance of                            previous anesthesia was also reviewed. The risks                            and benefits of the procedure and the sedation                            options and risks were discussed with the patient.                            All questions were answered, and informed consent                            was obtained. Prior Anticoagulants: The patient has                            taken no previous anticoagulant or antiplatelet                            agents. ASA Grade Assessment: II - A patient with                            mild systemic disease. After reviewing the risks                            and benefits, the patient was deemed in                            satisfactory condition to undergo the procedure.  After obtaining informed consent, the colonoscope                            was passed under direct vision. Throughout the                            procedure, the patient's blood pressure, pulse, and                            oxygen saturations were monitored continuously. The                            Colonoscope was introduced through the anus and                            advanced to the the cecum,  identified by                            appendiceal orifice and ileocecal valve. The                            colonoscopy was performed without difficulty. The                            patient tolerated the procedure well. The quality                            of the bowel preparation was good. The ileocecal                            valve, appendiceal orifice, and rectum were                            photographed. Scope In: 9:53:01 AM Scope Out: 10:03:04 AM Scope Withdrawal Time: 0 hours 6 minutes 43 seconds  Total Procedure Duration: 0 hours 10 minutes 3 seconds  Findings:                 Multiple small-mouthed diverticula were found in                            the left colon.                           The exam was otherwise without abnormality on                            direct and retroflexion views. Complications:            No immediate complications. Estimated blood loss:                            None. Estimated Blood Loss:     Estimated blood loss: none. Impression:               - Diverticulosis in the left colon.                           -  The examination was otherwise normal on direct                            and retroflexion views.                           - No polyps or cancers. Recommendation:           - Patient has a contact number available for                            emergencies. The signs and symptoms of potential                            delayed complications were discussed with the                            patient. Return to normal activities tomorrow.                            Written discharge instructions were provided to the                            patient.                           - Resume previous diet.                           - Continue present medications.                           - You do not need any further colon cancer                            screening tests (including stool testing). These                             types of tests generally stop around age 41-80. Milus Banister, MD 12/13/2019 10:07:51 AM This report has been signed electronically.

## 2019-12-13 NOTE — Patient Instructions (Signed)
Information on diverticulosis given to you today.  Resume previous diet and medications.  YOU HAD AN ENDOSCOPIC PROCEDURE TODAY AT THE [facility]:   Refer to the procedure report that was given to you for any specific questions about what was found during the examination.  If the procedure report does not answer your questions, please call your gastroenterologist to clarify.  If you requested that your care partner not be given the details of your procedure findings, then the procedure report has been included in a sealed envelope for you to review at your convenience later.  YOU SHOULD EXPECT: Some feelings of bloating in the abdomen. Passage of more gas than usual.  Walking can help get rid of the air that was put into your GI tract during the procedure and reduce the bloating. If you had a lower endoscopy (such as a colonoscopy or flexible sigmoidoscopy) you may notice spotting of blood in your stool or on the toilet paper. If you underwent a bowel prep for your procedure, you may not have a normal bowel movement for a few days.  Please Note:  You might notice some irritation and congestion in your nose or some drainage.  This is from the oxygen used during your procedure.  There is no need for concern and it should clear up in a day or so.  SYMPTOMS TO REPORT IMMEDIATELY:   Following lower endoscopy (colonoscopy or flexible sigmoidoscopy):  Excessive amounts of blood in the stool  Significant tenderness or worsening of abdominal pains  Swelling of the abdomen that is new, acute  Fever of 100F or higher   For urgent or emergent issues, a gastroenterologist can be reached at any hour by calling [phone]. Do not use MyChart messaging for urgent concerns.    DIET:  We do recommend a small meal at first, but then you may proceed to your regular diet.  Drink plenty of fluids but you should avoid alcoholic beverages for 24 hours.  ACTIVITY:  You should plan to take it easy  for the rest of today and you should NOT DRIVE or use heavy machinery until tomorrow (because of the sedation medicines used during the test).    FOLLOW UP: Our staff will call the number listed on your records 48-72 hours following your procedure to check on you and address any questions or concerns that you may have regarding the information given to you following your procedure. If we do not reach you, we will leave a message.  We will attempt to reach you two times.  During this call, we will ask if you have developed any symptoms of COVID 19. If you develop any symptoms (ie: fever, flu-like symptoms, shortness of breath, cough etc.) before then, please call [phone].  If you test positive for Covid 19 in the 2 weeks post procedure, please call and report this information to us.    If any biopsies were taken you will be contacted by phone or by letter within the next 1-3 weeks.  Please call us at [phone] if you have not heard about the biopsies in 3 weeks.    SIGNATURES/CONFIDENTIALITY: You and/or your care partner have signed paperwork which will be entered into your electronic medical record.  These signatures attest to the fact that that the information above on your After Visit Summary has been reviewed and is understood.  Full responsibility of the confidentiality of this discharge information lies with you and/or your care-partner. 

## 2019-12-13 NOTE — Progress Notes (Signed)
Pt Drowsy. VSS. To PACU, report to RN. No anesthetic complications noted.  

## 2019-12-13 NOTE — Progress Notes (Signed)
Pt's states no medical or surgical changes since previsit or office visit. 

## 2019-12-17 ENCOUNTER — Telehealth: Payer: Self-pay

## 2019-12-17 NOTE — Telephone Encounter (Signed)
  Follow up Call-  Call back number 12/13/2019  Post procedure Call Back phone  # [phone]  Permission to leave phone message Yes  Some recent data might be hidden     Patient questions:  Do you have a fever, pain , or abdominal swelling? No. Pain Score  0 *  Have you tolerated food without any problems? Yes.    Have you been able to return to your normal activities? Yes.    Do you have any questions about your discharge instructions: Diet   No. Medications  No. Follow up visit  No.  Do you have questions or concerns about your Care? No.  Actions: * If pain score is 4 or above: No action needed, pain <4.  1. Have you developed a fever since your procedure? no  2.   Have you had an respiratory symptoms (SOB or cough) since your procedure? no  3.   Have you tested positive for COVID 19 since your procedure no  4.   Have you had any family members/close contacts diagnosed with the COVID 19 since your procedure?  no   If yes to any of these questions please route to Joylene John, RN and Joella Prince, RN

## 2019-12-20 ENCOUNTER — Encounter: Payer: Self-pay | Admitting: Family Medicine

## 2019-12-20 DIAGNOSIS — Z23 Encounter for immunization: Secondary | ICD-10-CM | POA: Diagnosis not present

## 2020-01-28 ENCOUNTER — Encounter: Payer: Self-pay | Admitting: Family Medicine

## 2020-03-13 ENCOUNTER — Telehealth: Payer: Self-pay | Admitting: Family Medicine

## 2020-03-13 NOTE — Telephone Encounter (Signed)
Left message for patient to call back and schedule Medicare Annual Wellness Visit (AWV) either virtually or in office.   Last AWV no information please schedule at anytime with [facility] Nurse Health Advisor 1 or 2   This should be a 45 minute visit. 

## 2020-03-19 ENCOUNTER — Encounter: Payer: Self-pay | Admitting: Family Medicine

## 2020-04-29 DIAGNOSIS — N401 Enlarged prostate with lower urinary tract symptoms: Secondary | ICD-10-CM | POA: Diagnosis not present

## 2020-04-29 DIAGNOSIS — N200 Calculus of kidney: Secondary | ICD-10-CM | POA: Diagnosis not present

## 2020-04-29 DIAGNOSIS — R3915 Urgency of urination: Secondary | ICD-10-CM | POA: Diagnosis not present

## 2020-04-29 DIAGNOSIS — N486 Induration penis plastica: Secondary | ICD-10-CM | POA: Diagnosis not present

## 2020-06-03 DIAGNOSIS — N401 Enlarged prostate with lower urinary tract symptoms: Secondary | ICD-10-CM | POA: Diagnosis not present

## 2020-06-03 DIAGNOSIS — R3915 Urgency of urination: Secondary | ICD-10-CM | POA: Diagnosis not present

## 2020-06-03 DIAGNOSIS — R350 Frequency of micturition: Secondary | ICD-10-CM | POA: Diagnosis not present

## 2020-08-24 ENCOUNTER — Encounter: Payer: Self-pay | Admitting: Family Medicine

## 2020-09-28 IMAGING — MR MRI LUMBAR SPINE WITHOUT CONTRAST
4 of 5 series · 24 of 48 positions shown · non-contrast
Comparison: None.

CLINICAL DATA: Low back pain radiating into the left buttock and
leg for 1 month. No known injury.

EXAM:
MRI LUMBAR SPINE WITHOUT CONTRAST
TECHNIQUE: Multiplanar, multisequence MR imaging of the lumbar spine was
performed. No intravenous contrast was administered.

[Series 4: T1 · sagittal · 4.0mm · 0.59mm/px · 6 of 15 slices shown (1 of 2)]
[im 1/15]
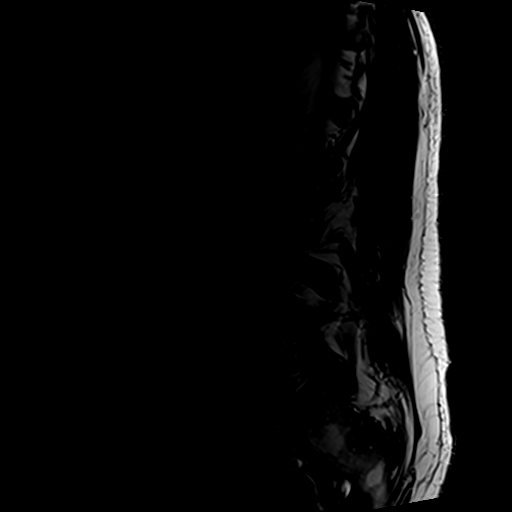
[im 3/15]
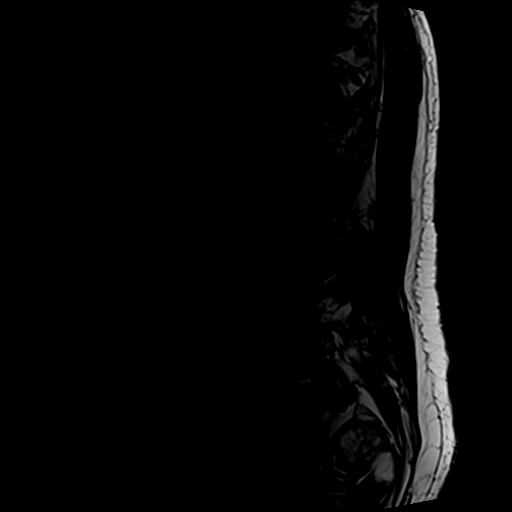
[im 6/15]
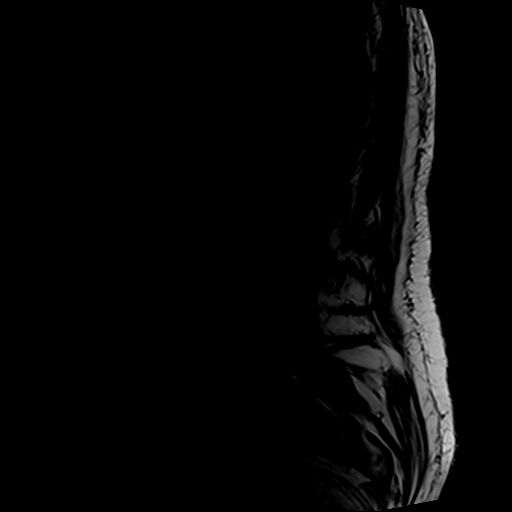
[im 9/15]
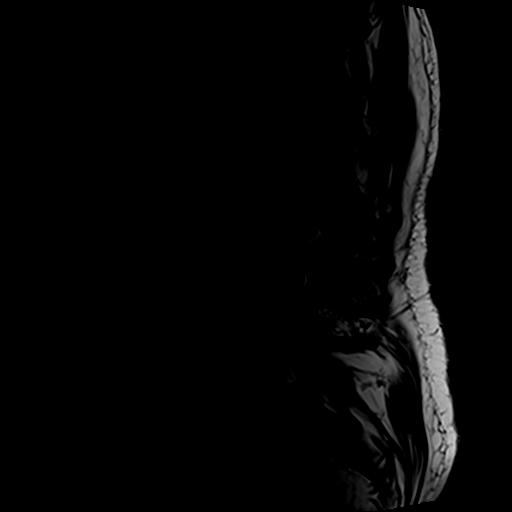
[im 12/15]
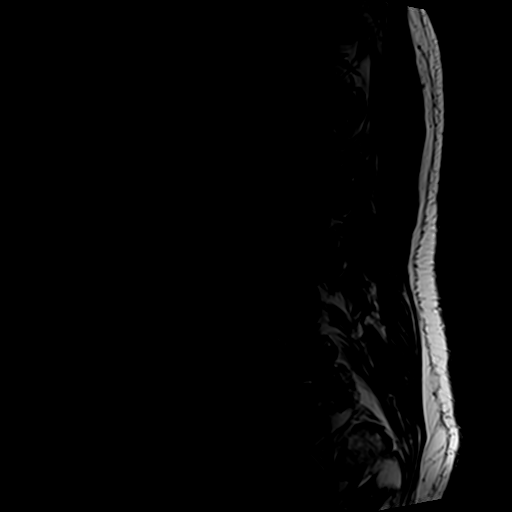
[im 15/15]
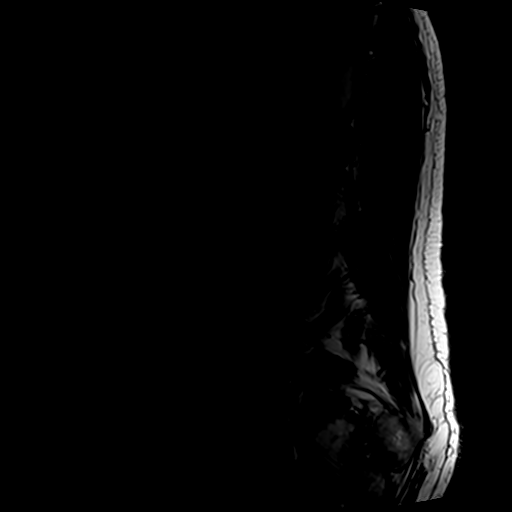

[Series 5: T2 post-contrast · sagittal · 4.0mm · 0.59mm/px · 6 of 15 slices shown]
[im 1/15]
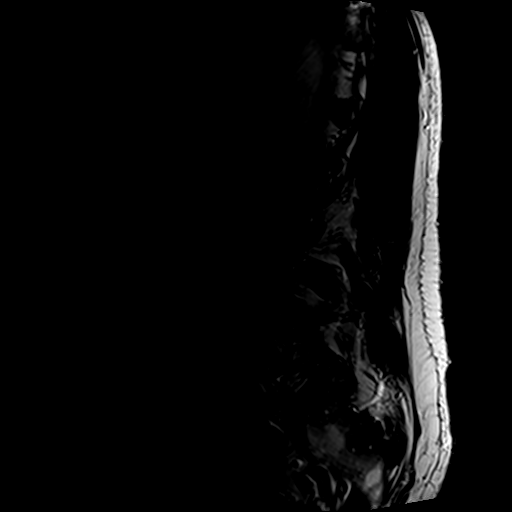
[im 3/15]
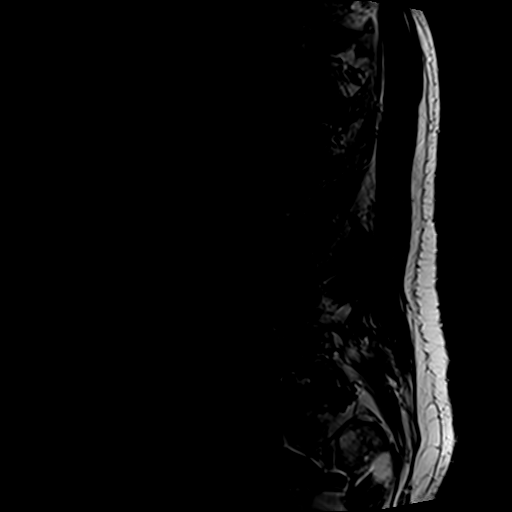
[im 6/15]
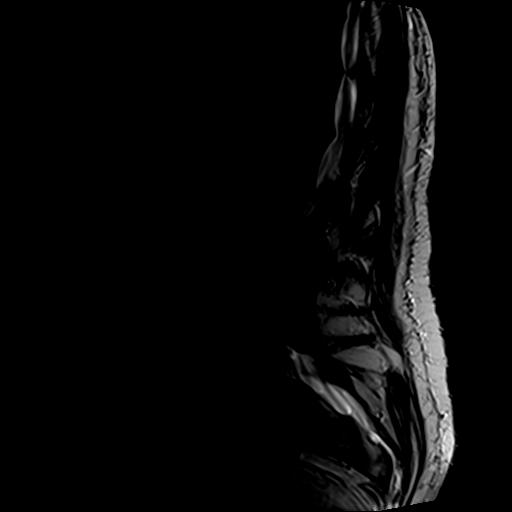
[im 9/15]
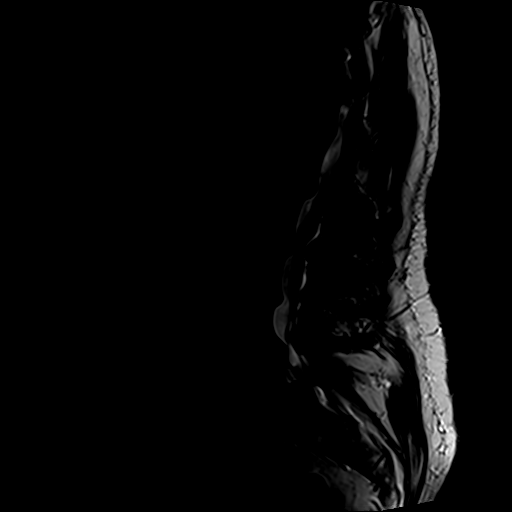
[im 12/15]
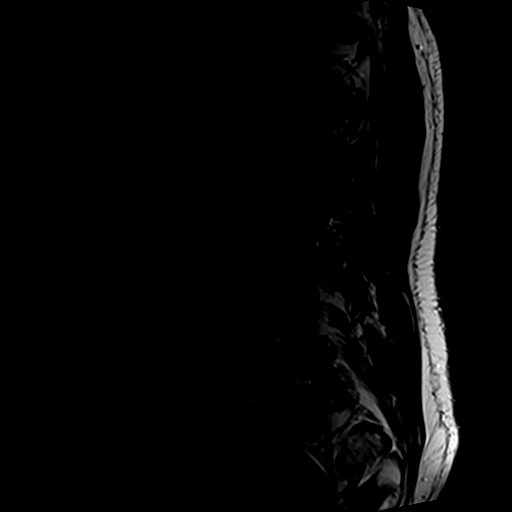
[im 15/15]
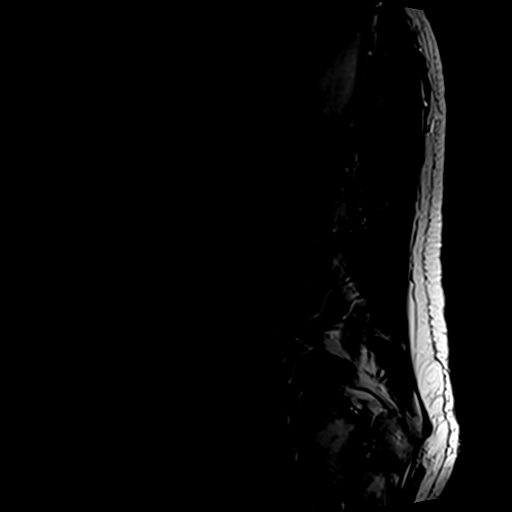

[Series 6: T2 · axial · 4.0mm · 0.70mm/px · z∈[-62,+161]mm · 9 of 39 slices shown]
[im 1/39]
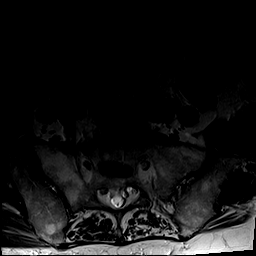
[im 6/39]
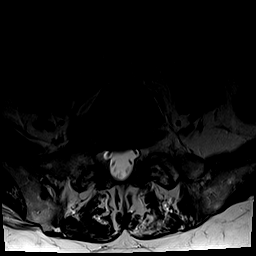
[im 11/39]
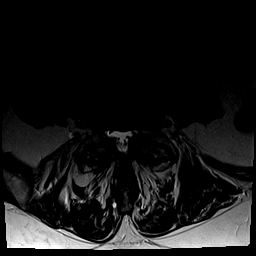
[im 17/39]
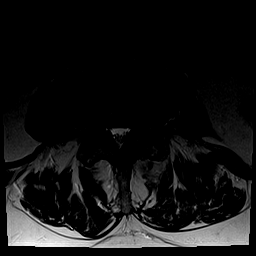
[im 20/39]
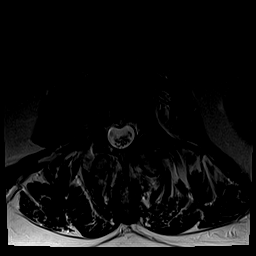
[im 22/39]
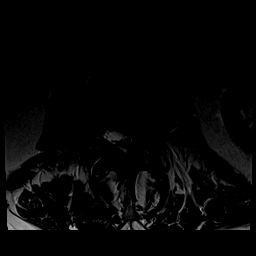
[im 28/39]
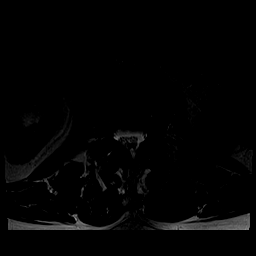
[im 33/39]
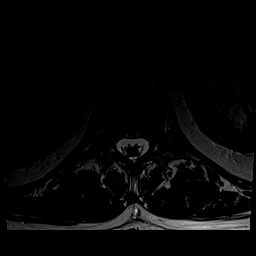
[im 39/39]
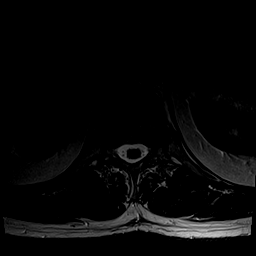

[Series 7: T1 · axial · 4.0mm · 0.35mm/px · z∈[-38,+132]mm · 3 of 39 slices shown (2 of 2)]
[im 6/39]
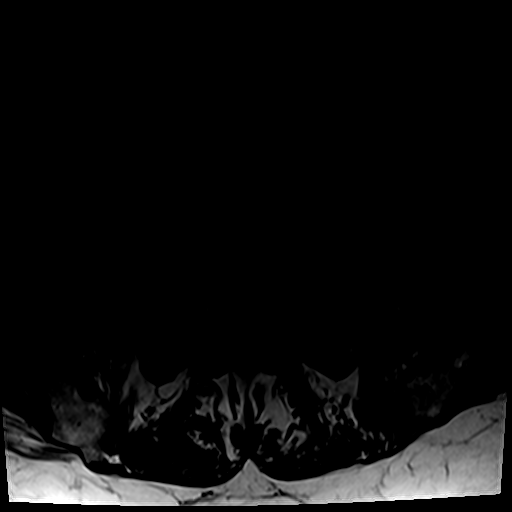
[im 20/39]
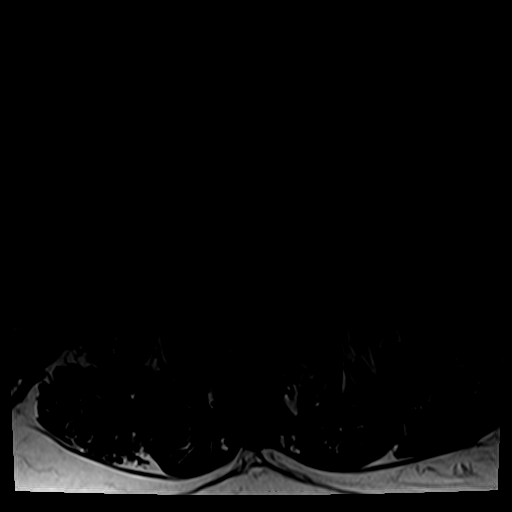
[im 33/39]
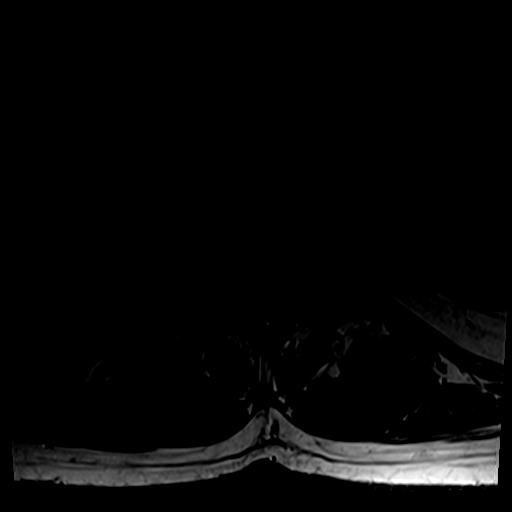

[24 of 48 positions shown; findings below may reference images not displayed]

FINDINGS: Segmentation:  Standard.

Alignment: 0.3 cm retrolisthesis L1 on L2 and trace retrolisthesis
L2 on L3. 0.5 cm anterolisthesis L4 on L5 and trace anterolisthesis
L5 on S1 due to facet arthropathy also noted.

Vertebrae: No fracture, evidence of discitis, or bone lesion.
Scattered mild degenerative endplate signal change is most notable
at L2-3 and L3-4.

Conus medullaris and cauda equina: Conus extends to the L2 level.
Conus and cauda equina appear normal.

Paraspinal and other soft tissues: Negative.

Disc levels:

T10-11 and T11-12 are imaged in the sagittal plane only. Shallow
disc bulges are seen at both levels without stenosis.

T12-L1: Shallow disc bulge causes mild central canal narrowing. The
foramina are open.

L1-2: Minimal disc bulge and mild-to-moderate facet degenerative
change. The central canal is open. Moderately severe bilateral
foraminal narrowing is present.

L2-3: Bulky ligamentum flavum thickening and moderately severe facet
degenerative change. There is a diffuse broad-based disc bulge and
large extruded and likely sequestered disc fragment extending
cephalad in the left subarticular recess and foramen. Disc fragment
measures approximately 1.6 cm transverse by 1.3 cm AP by 1.1 cm
craniocaudal. There is severe central canal stenosis at this level.
The extruded disc fragment causes severe narrowing in the left
subarticular recess and foramen with impingement on the descending
and exiting left L2 root. Mild right foraminal narrowing is present.

L3-4: Moderate bilateral facet degenerative change is worse on the
left. There is a disc bulge with endplate spur and ligamentum flavum
thickening. Moderate central canal stenosis and moderate to
moderately severe foraminal narrowing, worse on the left.

L4-5: Advanced facet degenerative change, shallow disc bulge and
ligamentum flavum thickening. There is mild central canal narrowing
and mild to moderate foraminal narrowing, worse on the left.

L5-S1: Advanced bilateral facet degenerative disease. The disc is
uncovered without bulging. The central canal is open. Moderate right
and mild left foraminal narrowing.
IMPRESSION: Severe central canal stenosis at L2-3 where there is a disc bulge,
bulky ligamentum flavum thickening and advanced facet degenerative
change. A large extruded and sequestered disc fragment extending
cephalad out of the interspace causes severe left foraminal and
subarticular recess narrowing and impingement on the descending and
exiting left L2 root.

Moderate central canal stenosis and moderate to moderately severe
foraminal narrowing L3-4. Foraminal narrowing is worse on the left.

Mild to moderate foraminal narrowing at L5-S1 is worse on the right.

## 2020-10-12 DIAGNOSIS — D224 Melanocytic nevi of scalp and neck: Secondary | ICD-10-CM | POA: Diagnosis not present

## 2020-10-12 DIAGNOSIS — L812 Freckles: Secondary | ICD-10-CM | POA: Diagnosis not present

## 2020-10-12 DIAGNOSIS — D1801 Hemangioma of skin and subcutaneous tissue: Secondary | ICD-10-CM | POA: Diagnosis not present

## 2020-10-12 DIAGNOSIS — L821 Other seborrheic keratosis: Secondary | ICD-10-CM | POA: Diagnosis not present

## 2020-10-12 DIAGNOSIS — D485 Neoplasm of uncertain behavior of skin: Secondary | ICD-10-CM | POA: Diagnosis not present

## 2020-10-13 ENCOUNTER — Other Ambulatory Visit: Payer: Self-pay

## 2020-10-14 ENCOUNTER — Encounter: Payer: Self-pay | Admitting: Family Medicine

## 2020-10-14 ENCOUNTER — Ambulatory Visit (INDEPENDENT_AMBULATORY_CARE_PROVIDER_SITE_OTHER): Payer: Medicare Other | Admitting: Family Medicine

## 2020-10-14 VITALS — BP 120/88 | HR 66 | Temp 97.9°F | Ht 75.0 in | Wt 207.0 lb

## 2020-10-14 DIAGNOSIS — N138 Other obstructive and reflux uropathy: Secondary | ICD-10-CM

## 2020-10-14 DIAGNOSIS — N401 Enlarged prostate with lower urinary tract symptoms: Secondary | ICD-10-CM

## 2020-10-14 DIAGNOSIS — R739 Hyperglycemia, unspecified: Secondary | ICD-10-CM | POA: Diagnosis not present

## 2020-10-14 DIAGNOSIS — Z209 Contact with and (suspected) exposure to unspecified communicable disease: Secondary | ICD-10-CM | POA: Diagnosis not present

## 2020-10-14 DIAGNOSIS — M8949 Other hypertrophic osteoarthropathy, multiple sites: Secondary | ICD-10-CM | POA: Diagnosis not present

## 2020-10-14 DIAGNOSIS — E782 Mixed hyperlipidemia: Secondary | ICD-10-CM

## 2020-10-14 DIAGNOSIS — I10 Essential (primary) hypertension: Secondary | ICD-10-CM

## 2020-10-14 DIAGNOSIS — M159 Polyosteoarthritis, unspecified: Secondary | ICD-10-CM

## 2020-10-14 LAB — CBC WITH DIFFERENTIAL/PLATELET
Basophils Absolute: 0 10*3/uL (ref 0.0–0.1)
Basophils Relative: 0.3 % (ref 0.0–3.0)
Eosinophils Absolute: 0.2 10*3/uL (ref 0.0–0.7)
Eosinophils Relative: 4.2 % (ref 0.0–5.0)
HCT: 46.4 % (ref 39.0–52.0)
Hemoglobin: 16.2 g/dL (ref 13.0–17.0)
Lymphocytes Relative: 16.3 % (ref 12.0–46.0)
Lymphs Abs: 0.7 10*3/uL (ref 0.7–4.0)
MCHC: 35 g/dL (ref 30.0–36.0)
MCV: 91.6 fl (ref 78.0–100.0)
Monocytes Absolute: 0.4 10*3/uL (ref 0.1–1.0)
Monocytes Relative: 9.9 % (ref 3.0–12.0)
Neutro Abs: 3.1 10*3/uL (ref 1.4–7.7)
Neutrophils Relative %: 69.3 % (ref 43.0–77.0)
Platelets: 120 10*3/uL — ABNORMAL LOW (ref 150.0–400.0)
RBC: 5.06 Mil/uL (ref 4.22–5.81)
RDW: 12.8 % (ref 11.5–15.5)
WBC: 4.4 10*3/uL (ref 4.0–10.5)

## 2020-10-14 LAB — PSA: PSA: 1.25 ng/mL (ref 0.10–4.00)

## 2020-10-14 LAB — TSH: TSH: 1.24 u[IU]/mL (ref 0.35–5.50)

## 2020-10-14 LAB — BASIC METABOLIC PANEL
BUN: 25 mg/dL — ABNORMAL HIGH (ref 6–23)
CO2: 27 mEq/L (ref 19–32)
Calcium: 9.5 mg/dL (ref 8.4–10.5)
Chloride: 106 mEq/L (ref 96–112)
Creatinine, Ser: 0.89 mg/dL (ref 0.40–1.50)
GFR: 81.13 mL/min (ref >60.00)
Glucose, Bld: 108 mg/dL — ABNORMAL HIGH (ref 70–99)
Potassium: 4.1 mEq/L (ref 3.5–5.1)
Sodium: 141 mEq/L (ref 135–145)

## 2020-10-14 LAB — HEPATIC FUNCTION PANEL
ALT: 18 U/L (ref 0–53)
AST: 18 U/L (ref 0–37)
Albumin: 4.5 g/dL (ref 3.5–5.2)
Alkaline Phosphatase: 79 U/L (ref 39–117)
Bilirubin, Direct: 0.2 mg/dL (ref 0.0–0.3)
Total Bilirubin: 1 mg/dL (ref 0.2–1.2)
Total Protein: 6.6 g/dL (ref 6.0–8.3)

## 2020-10-14 LAB — LIPID PANEL
Cholesterol: 184 mg/dL (ref 0–200)
HDL: 46 mg/dL (ref >39.00)
LDL Cholesterol: 116 mg/dL — ABNORMAL HIGH (ref 0–99)
NonHDL: 137.69
Total CHOL/HDL Ratio: 4
Triglycerides: 108 mg/dL (ref 0.0–149.0)
VLDL: 21.6 mg/dL (ref 0.0–40.0)

## 2020-10-14 LAB — T4, FREE: Free T4: 0.8 ng/dL (ref 0.60–1.60)

## 2020-10-14 LAB — HEMOGLOBIN A1C: Hgb A1c MFr Bld: 5.4 % (ref 4.6–6.5)

## 2020-10-14 LAB — T3, FREE: T3, Free: 3.1 pg/mL (ref 2.3–4.2)

## 2020-10-14 MED ORDER — METAXALONE 400 MG PO TABS: 800.0000 mg | ORAL_TABLET | Freq: Three times a day (TID) | ORAL | 0 refills | Status: DC | PRN

## 2020-10-14 MED ORDER — SULINDAC 200 MG PO TABS: 200.0000 mg | ORAL_TABLET | Freq: Every day | ORAL | 3 refills | Status: DC

## 2020-10-14 MED ORDER — TRIAMCINOLONE ACETONIDE 0.1 % EX CREA: 1.0000 "application " | TOPICAL_CREAM | Freq: Two times a day (BID) | CUTANEOUS | 5 refills | Status: DC | PRN

## 2020-10-14 MED ORDER — LOSARTAN POTASSIUM 25 MG PO TABS: 25.0000 mg | ORAL_TABLET | Freq: Every day | ORAL | 3 refills | Status: DC

## 2020-10-14 NOTE — Progress Notes (Signed)
Subjective:    Patient ID: Alex Weiss, male    DOB: 1940-10-21, 80 y.o.   MRN: [id number]  HPI Here to follow up on issues. He feels well. His BP is stable. His low back pain has not bothered him for quite awhile. He walks several miles every day. He was recently switched from Flomax to Rapaflo at the [facility] clinic, and he says this works much better for the BPH symptoms.    Review of Systems  Constitutional: Negative.   HENT: Negative.    Eyes: Negative.   Respiratory: Negative.    Cardiovascular: Negative.   Gastrointestinal: Negative.   Genitourinary: Negative.   Musculoskeletal: Negative.   Skin: Negative.   Neurological: Negative.   Psychiatric/Behavioral: Negative.        Objective:   Physical Exam Constitutional:      General: He is not in acute distress.    Appearance: Normal appearance. He is well-developed. He is not diaphoretic.  HENT:     Head: Normocephalic and atraumatic.     Right Ear: External ear normal.     Left Ear: External ear normal.     Nose: Nose normal.     Mouth/Throat:     Pharynx: No oropharyngeal exudate.  Eyes:     General: No scleral icterus.       Right eye: No discharge.        Left eye: No discharge.     Conjunctiva/sclera: Conjunctivae normal.     Pupils: Pupils are equal, round, and reactive to light.  Neck:     Thyroid: No thyromegaly.     Vascular: No JVD.     Trachea: No tracheal deviation.  Cardiovascular:     Rate and Rhythm: Normal rate and regular rhythm.     Heart sounds: Normal heart sounds. No murmur heard.   No friction rub. No gallop.  Pulmonary:     Effort: Pulmonary effort is normal. No respiratory distress.     Breath sounds: Normal breath sounds. No wheezing or rales.  Chest:     Chest wall: No tenderness.  Abdominal:     General: Bowel sounds are normal. There is no distension.     Palpations: Abdomen is soft. There is no mass.     Tenderness: There is no abdominal tenderness. There is no guarding or  rebound.  Genitourinary:    Penis: Normal. No tenderness.      Testes: Normal.     Prostate: Normal.     Rectum: Normal. Guaiac result negative.  Musculoskeletal:        General: No tenderness. Normal range of motion.     Cervical back: Neck supple.  Lymphadenopathy:     Cervical: No cervical adenopathy.  Skin:    General: Skin is warm and dry.     Coloration: Skin is not pale.     Findings: No erythema or rash.  Neurological:     Mental Status: He is alert and oriented to person, place, and time.     Cranial Nerves: No cranial nerve deficit.     Motor: No abnormal muscle tone.     Coordination: Coordination normal.     Deep Tendon Reflexes: Reflexes are normal and symmetric. Reflexes normal.  Psychiatric:        Behavior: Behavior normal.        Thought Content: Thought content normal.        Judgment: Judgment normal.          Assessment &  Plan:  His HTN and BPH and OA are stable. We will get fasting labs to check his lipids, A1c, etc. We spent 35 minutes reviewing his records and discussing these issues.  Alysia Penna, MD

## 2020-10-15 LAB — HEPATITIS C ANTIBODY
Hepatitis C Ab: NONREACTIVE
SIGNAL TO CUT-OFF: 0.01 (ref <1.00)

## 2020-10-15 NOTE — Telephone Encounter (Signed)
I just added a UA order. Hopefully they still have the sample to run this on. His hepatitis C test is still pending

## 2020-12-23 ENCOUNTER — Encounter: Payer: Self-pay | Admitting: Family Medicine

## 2020-12-23 NOTE — Telephone Encounter (Signed)
No I think getting the 23 and 13 provides good coverage

## 2020-12-28 ENCOUNTER — Encounter: Payer: Self-pay | Admitting: Family Medicine

## 2021-01-27 ENCOUNTER — Encounter: Payer: Self-pay | Admitting: Family Medicine

## 2021-02-05 ENCOUNTER — Encounter: Payer: Self-pay | Admitting: Family Medicine

## 2021-02-05 NOTE — Telephone Encounter (Signed)
I have been following this as well. Since all the other cell counts are fine, this is generally not a problem. I usually simply follow the platelet counts, and if they get around 100 or so, then I often refer to Hematology.

## 2021-04-22 ENCOUNTER — Encounter: Payer: Self-pay | Admitting: Family Medicine

## 2021-04-22 DIAGNOSIS — G8929 Other chronic pain: Secondary | ICD-10-CM

## 2021-04-22 DIAGNOSIS — M79645 Pain in left finger(s): Secondary | ICD-10-CM

## 2021-04-23 NOTE — Telephone Encounter (Signed)
I did a referral for him to see Dr. Amedeo Plenty

## 2021-05-14 DIAGNOSIS — M79645 Pain in left finger(s): Secondary | ICD-10-CM | POA: Diagnosis not present

## 2021-05-21 DIAGNOSIS — N400 Enlarged prostate without lower urinary tract symptoms: Secondary | ICD-10-CM | POA: Diagnosis not present

## 2021-05-21 DIAGNOSIS — J209 Acute bronchitis, unspecified: Secondary | ICD-10-CM | POA: Diagnosis not present

## 2021-05-21 DIAGNOSIS — I10 Essential (primary) hypertension: Secondary | ICD-10-CM | POA: Diagnosis not present

## 2021-05-27 ENCOUNTER — Encounter: Payer: Self-pay | Admitting: Family Medicine

## 2021-05-27 ENCOUNTER — Ambulatory Visit (INDEPENDENT_AMBULATORY_CARE_PROVIDER_SITE_OTHER): Payer: Medicare Other | Admitting: Family Medicine

## 2021-05-27 VITALS — BP 118/76 | HR 70 | Temp 98.6°F | Wt 204.0 lb

## 2021-05-27 DIAGNOSIS — J40 Bronchitis, not specified as acute or chronic: Secondary | ICD-10-CM

## 2021-05-27 MED ORDER — METHYLPREDNISOLONE 4 MG PO TBPK: ORAL_TABLET | ORAL | 0 refills | Status: DC

## 2021-05-27 MED ORDER — DOXYCYCLINE HYCLATE 100 MG PO CAPS: 100.0000 mg | ORAL_CAPSULE | Freq: Two times a day (BID) | ORAL | 0 refills | Status: AC

## 2021-05-27 NOTE — Progress Notes (Signed)
° °  Subjective:    Patient ID: Alex Weiss, male    DOB: 1940-08-01, 81 y.o.   MRN: [id number]  HPI Here to follow up on an urgent care visit in [place], [place] on 05-21-21. He  Presented with several days of chest congestion, coughing up grayish sputum, and hoarseness. No fever or body aches. He was wheezing and was slightly SOB. He tested negative for Covid, RSV, and influenza that day. He was given a Zpack, an inhaler, and cough syrup. Since then he has improved but he still has the chest congestion and the productive cough. No chest pain.    Review of Systems  Constitutional: Negative.   HENT:  Positive for congestion and postnasal drip. Negative for sore throat.   Eyes: Negative.   Respiratory:  Positive for cough, chest tightness and wheezing. Negative for shortness of breath.   Cardiovascular: Negative.   Gastrointestinal: Negative.       Objective:   Physical Exam Constitutional:      Appearance: Normal appearance. He is not ill-appearing.  HENT:     Right Ear: Tympanic membrane, ear canal and external ear normal.     Left Ear: Tympanic membrane, ear canal and external ear normal.     Nose: Nose normal.     Mouth/Throat:     Pharynx: Oropharynx is clear.  Eyes:     Conjunctiva/sclera: Conjunctivae normal.  Pulmonary:     Effort: Pulmonary effort is normal.     Breath sounds: Normal breath sounds.  Lymphadenopathy:     Cervical: No cervical adenopathy.  Neurological:     Mental Status: He is alert.          Assessment & Plan:  Partially treated bronchitis. We will give him 10 days of Doxycycline and a Medrol dose pack. Recheck as needed. We spent a total of ( 31  ) minutes reviewing records and discussing these issues.  Alysia Penna, MD

## 2021-09-27 DIAGNOSIS — J0110 Acute frontal sinusitis, unspecified: Secondary | ICD-10-CM | POA: Diagnosis not present

## 2021-10-18 DIAGNOSIS — L718 Other rosacea: Secondary | ICD-10-CM | POA: Diagnosis not present

## 2021-10-18 DIAGNOSIS — L812 Freckles: Secondary | ICD-10-CM | POA: Diagnosis not present

## 2021-10-18 DIAGNOSIS — L570 Actinic keratosis: Secondary | ICD-10-CM | POA: Diagnosis not present

## 2021-10-18 DIAGNOSIS — D225 Melanocytic nevi of trunk: Secondary | ICD-10-CM | POA: Diagnosis not present

## 2021-10-18 DIAGNOSIS — D1801 Hemangioma of skin and subcutaneous tissue: Secondary | ICD-10-CM | POA: Diagnosis not present

## 2021-10-18 DIAGNOSIS — L905 Scar conditions and fibrosis of skin: Secondary | ICD-10-CM | POA: Diagnosis not present

## 2021-10-18 DIAGNOSIS — L821 Other seborrheic keratosis: Secondary | ICD-10-CM | POA: Diagnosis not present

## 2021-10-20 ENCOUNTER — Encounter: Payer: Self-pay | Admitting: Family Medicine

## 2021-10-20 ENCOUNTER — Ambulatory Visit (INDEPENDENT_AMBULATORY_CARE_PROVIDER_SITE_OTHER): Payer: Medicare Other | Admitting: Family Medicine

## 2021-10-20 VITALS — BP 130/80 | HR 67 | Temp 97.9°F | Ht 72.0 in | Wt 206.0 lb

## 2021-10-20 DIAGNOSIS — R739 Hyperglycemia, unspecified: Secondary | ICD-10-CM | POA: Diagnosis not present

## 2021-10-20 DIAGNOSIS — I10 Essential (primary) hypertension: Secondary | ICD-10-CM

## 2021-10-20 DIAGNOSIS — N138 Other obstructive and reflux uropathy: Secondary | ICD-10-CM

## 2021-10-20 DIAGNOSIS — G4733 Obstructive sleep apnea (adult) (pediatric): Secondary | ICD-10-CM

## 2021-10-20 DIAGNOSIS — K5190 Ulcerative colitis, unspecified, without complications: Secondary | ICD-10-CM

## 2021-10-20 DIAGNOSIS — E782 Mixed hyperlipidemia: Secondary | ICD-10-CM | POA: Diagnosis not present

## 2021-10-20 DIAGNOSIS — N401 Enlarged prostate with lower urinary tract symptoms: Secondary | ICD-10-CM | POA: Diagnosis not present

## 2021-10-20 DIAGNOSIS — M150 Primary generalized (osteo)arthritis: Secondary | ICD-10-CM

## 2021-10-20 DIAGNOSIS — M159 Polyosteoarthritis, unspecified: Secondary | ICD-10-CM | POA: Diagnosis not present

## 2021-10-20 LAB — CBC WITH DIFFERENTIAL/PLATELET
Basophils Absolute: 0 10*3/uL (ref 0.0–0.1)
Basophils Relative: 0.6 % (ref 0.0–3.0)
Eosinophils Absolute: 0.2 10*3/uL (ref 0.0–0.7)
Eosinophils Relative: 4.8 % (ref 0.0–5.0)
HCT: 47.2 % (ref 39.0–52.0)
Hemoglobin: 16.1 g/dL (ref 13.0–17.0)
Lymphocytes Relative: 25.1 % (ref 12.0–46.0)
Lymphs Abs: 1 10*3/uL (ref 0.7–4.0)
MCHC: 34.2 g/dL (ref 30.0–36.0)
MCV: 92.8 fl (ref 78.0–100.0)
Monocytes Absolute: 0.4 10*3/uL (ref 0.1–1.0)
Monocytes Relative: 10.7 % (ref 3.0–12.0)
Neutro Abs: 2.4 10*3/uL (ref 1.4–7.7)
Neutrophils Relative %: 58.8 % (ref 43.0–77.0)
Platelets: 135 10*3/uL — ABNORMAL LOW (ref 150.0–400.0)
RBC: 5.09 Mil/uL (ref 4.22–5.81)
RDW: 12.6 % (ref 11.5–15.5)
WBC: 4.1 10*3/uL (ref 4.0–10.5)

## 2021-10-20 LAB — HEPATIC FUNCTION PANEL
ALT: 14 U/L (ref 0–53)
AST: 18 U/L (ref 0–37)
Albumin: 4.7 g/dL (ref 3.5–5.2)
Alkaline Phosphatase: 82 U/L (ref 39–117)
Bilirubin, Direct: 0.2 mg/dL (ref 0.0–0.3)
Total Bilirubin: 1.1 mg/dL (ref 0.2–1.2)
Total Protein: 6.9 g/dL (ref 6.0–8.3)

## 2021-10-20 LAB — BASIC METABOLIC PANEL
BUN: 23 mg/dL (ref 6–23)
CO2: 26 mEq/L (ref 19–32)
Calcium: 9.5 mg/dL (ref 8.4–10.5)
Chloride: 104 mEq/L (ref 96–112)
Creatinine, Ser: 0.86 mg/dL (ref 0.40–1.50)
GFR: 81.39 mL/min (ref >60.00)
Glucose, Bld: 95 mg/dL (ref 70–99)
Potassium: 4.3 mEq/L (ref 3.5–5.1)
Sodium: 141 mEq/L (ref 135–145)

## 2021-10-20 LAB — LIPID PANEL
Cholesterol: 186 mg/dL (ref 0–200)
HDL: 45.6 mg/dL (ref >39.00)
LDL Cholesterol: 123 mg/dL — ABNORMAL HIGH (ref 0–99)
NonHDL: 140.34
Total CHOL/HDL Ratio: 4
Triglycerides: 85 mg/dL (ref 0.0–149.0)
VLDL: 17 mg/dL (ref 0.0–40.0)

## 2021-10-20 LAB — PSA: PSA: 1.43 ng/mL (ref 0.10–4.00)

## 2021-10-20 LAB — TSH: TSH: 1.32 u[IU]/mL (ref 0.35–5.50)

## 2021-10-20 LAB — HEMOGLOBIN A1C: Hgb A1c MFr Bld: 5.7 % (ref 4.6–6.5)

## 2021-10-20 MED ORDER — LEVOFLOXACIN 500 MG PO TABS: 500.0000 mg | ORAL_TABLET | Freq: Every day | ORAL | 0 refills | Status: AC

## 2021-10-20 MED ORDER — TRIAMCINOLONE ACETONIDE 0.1 % EX CREA: 1.0000 | TOPICAL_CREAM | Freq: Two times a day (BID) | CUTANEOUS | 5 refills | Status: DC | PRN

## 2021-10-20 MED ORDER — SULINDAC 200 MG PO TABS: 200.0000 mg | ORAL_TABLET | Freq: Every day | ORAL | 3 refills | Status: DC

## 2021-10-20 MED ORDER — METAXALONE 400 MG PO TABS: 800.0000 mg | ORAL_TABLET | Freq: Three times a day (TID) | ORAL | 3 refills | Status: DC | PRN

## 2021-10-20 MED ORDER — LOSARTAN POTASSIUM 25 MG PO TABS: 25.0000 mg | ORAL_TABLET | Freq: Every day | ORAL | 3 refills | Status: DC

## 2021-10-20 NOTE — Progress Notes (Signed)
Subjective:    Patient ID: Alex Weiss, male    DOB: 10-07-1940, 81 y.o.   MRN: [id number]  HPI Here to follow up on issues. He is doing well in general, but he thinks he has sinus infection. In June when he was visiting [facility], he saw an urgent care for an apparent sinus infection and he was treated with 10 days of Cefdinir. He felt better after that, but he has continued to have sinus pressure since then and he often blows green mucus from the nose. No fever or cough. He still sees Dr. Roni Bread yearly for prostate checks. His BP is stable. His ulcerative colitis has been quiescent for years. He walks almost every day for exercise .   Review of Systems  Constitutional: Negative.   HENT:  Positive for congestion and sinus pressure.   Eyes: Negative.   Respiratory: Negative.    Cardiovascular: Negative.   Gastrointestinal: Negative.   Genitourinary: Negative.   Musculoskeletal: Negative.   Skin: Negative.   Neurological: Negative.   Psychiatric/Behavioral: Negative.         Objective:   Physical Exam Constitutional:      General: He is not in acute distress.    Appearance: Normal appearance. He is well-developed. He is not diaphoretic.  HENT:     Head: Normocephalic and atraumatic.     Right Ear: External ear normal.     Left Ear: External ear normal.     Nose: Nose normal.     Mouth/Throat:     Pharynx: No oropharyngeal exudate.  Eyes:     General: No scleral icterus.       Right eye: No discharge.        Left eye: No discharge.     Conjunctiva/sclera: Conjunctivae normal.     Pupils: Pupils are equal, round, and reactive to light.  Neck:     Thyroid: No thyromegaly.     Vascular: No JVD.     Trachea: No tracheal deviation.  Cardiovascular:     Rate and Rhythm: Normal rate and regular rhythm.     Heart sounds: Normal heart sounds. No murmur heard.    No friction rub. No gallop.  Pulmonary:     Effort: Pulmonary effort is normal. No respiratory distress.      Breath sounds: Normal breath sounds. No wheezing or rales.  Chest:     Chest wall: No tenderness.  Abdominal:     General: Bowel sounds are normal. There is no distension.     Palpations: Abdomen is soft. There is no mass.     Tenderness: There is no abdominal tenderness. There is no guarding or rebound.  Genitourinary:    Penis: No tenderness.   Musculoskeletal:        General: No tenderness. Normal range of motion.     Cervical back: Neck supple.  Lymphadenopathy:     Cervical: No cervical adenopathy.  Skin:    General: Skin is warm and dry.     Coloration: Skin is not pale.     Findings: No erythema or rash.  Neurological:     Mental Status: He is alert and oriented to person, place, and time.     Cranial Nerves: No cranial nerve deficit.     Motor: No abnormal muscle tone.     Coordination: Coordination normal.     Deep Tendon Reflexes: Reflexes are normal and symmetric. Reflexes normal.  Psychiatric:        Behavior: Behavior  normal.        Thought Content: Thought content normal.        Judgment: Judgment normal.           Assessment & Plan:  His HTN is stable. 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 His sleep apnea is stable. We will get fasting labs to check lipids, a PSA ,etc. For the sinusitis, we will give him 10 days of Levaquin. We spent a total of ( 34  ) minutes reviewing records and discussing these issues.  Alysia Penna, MD  Alysia Penna, MD

## 2021-11-01 DIAGNOSIS — R350 Frequency of micturition: Secondary | ICD-10-CM | POA: Diagnosis not present

## 2021-11-01 DIAGNOSIS — N401 Enlarged prostate with lower urinary tract symptoms: Secondary | ICD-10-CM | POA: Diagnosis not present

## 2021-11-01 DIAGNOSIS — N200 Calculus of kidney: Secondary | ICD-10-CM | POA: Diagnosis not present

## 2021-11-04 ENCOUNTER — Encounter: Payer: Self-pay | Admitting: Family Medicine

## 2021-11-05 ENCOUNTER — Telehealth: Payer: Self-pay | Admitting: Family Medicine

## 2021-11-05 NOTE — Telephone Encounter (Signed)
error 

## 2021-12-07 ENCOUNTER — Ambulatory Visit (INDEPENDENT_AMBULATORY_CARE_PROVIDER_SITE_OTHER): Payer: Medicare Other

## 2021-12-07 ENCOUNTER — Encounter: Payer: Self-pay | Admitting: Family Medicine

## 2021-12-07 ENCOUNTER — Ambulatory Visit (INDEPENDENT_AMBULATORY_CARE_PROVIDER_SITE_OTHER): Payer: Medicare Other | Admitting: Family Medicine

## 2021-12-07 VITALS — BP 124/78 | HR 63 | Temp 97.9°F | Wt 206.0 lb

## 2021-12-07 DIAGNOSIS — M25551 Pain in right hip: Secondary | ICD-10-CM | POA: Diagnosis not present

## 2021-12-07 DIAGNOSIS — M25552 Pain in left hip: Secondary | ICD-10-CM | POA: Diagnosis not present

## 2021-12-07 NOTE — Progress Notes (Signed)
   Subjective:    Patient ID: Alex Weiss, male    DOB: [birth year], 81 y.o.   MRN: [id number]  HPI Here for pain in the right hip that started after he fell on 07-02-21. That day as he was walking down some steps, he missed a step and fell on his right side. Since then he has had a sharp pain that comes and goes in the lateral hip. Sometimes this radiates down to the right knee. He gets fair relief by applying heat and taking Aleve.   Review of Systems  Constitutional: Negative.   Respiratory: Negative.    Cardiovascular: Negative.   Musculoskeletal:  Positive for arthralgias.       Objective:   Physical Exam Constitutional:      Appearance: Normal appearance.     Comments: He walks easily   Cardiovascular:     Rate and Rhythm: Normal rate and regular rhythm.     Pulses: Normal pulses.     Heart sounds: Normal heart sounds.  Pulmonary:     Effort: Pulmonary effort is normal.     Breath sounds: Normal breath sounds.  Musculoskeletal:     Comments: Right hip is normal on exam. Full ROM without pain. The trochanter is not tender   Neurological:     Mental Status: He is alert.           Assessment & Plan:  Right hip pain. We will get Xrays today. He will use Aleve as needed.  Alysia Penna, MD

## 2021-12-10 ENCOUNTER — Telehealth: Payer: Self-pay | Admitting: Family Medicine

## 2021-12-10 NOTE — Telephone Encounter (Signed)
Spoke with patient about xray results.

## 2021-12-10 NOTE — Addendum Note (Signed)
Addended by: Alysia Penna A on: 12/10/2021 12:55 PM   Modules accepted: Orders

## 2021-12-10 NOTE — Telephone Encounter (Signed)
Pt is calling and would like hip xray result

## 2021-12-17 DIAGNOSIS — M25551 Pain in right hip: Secondary | ICD-10-CM | POA: Diagnosis not present

## 2021-12-24 DIAGNOSIS — Z23 Encounter for immunization: Secondary | ICD-10-CM | POA: Diagnosis not present

## 2022-01-04 ENCOUNTER — Telehealth: Payer: Self-pay | Admitting: Family Medicine

## 2022-01-04 DIAGNOSIS — M1611 Unilateral primary osteoarthritis, right hip: Secondary | ICD-10-CM | POA: Diagnosis not present

## 2022-01-04 NOTE — Telephone Encounter (Signed)
Preoperative Risk Assessment form and Handicap Placard form (2 forms) to be filled out- placed in dr's folder.  Fax Preoperative Risk Assessment form to fax number on form.  Patient will pick up Handicap Placard form when complete.

## 2022-01-05 ENCOUNTER — Telehealth: Payer: Self-pay | Admitting: *Deleted

## 2022-01-05 NOTE — Telephone Encounter (Signed)
Pt forms received and placed on Dr [name] red folder for completing

## 2022-01-05 NOTE — Telephone Encounter (Signed)
   Pre-operative Risk Assessment    Patient Name: Alex Weiss  DOB: [birth year] MRN: [id number]    PT WILL NEED A NEW PT APPT; PT LAST SEEN BY CARDIOLOGY 2017.  WILL SEND A MESSAGE TO CHART PREP FOR REFERRAL AND NEW PT APPT.   Request for Surgical Clearance    Procedure:   RIGHT TOTAL HIP ARTHROPLASTY  Date of Surgery:  Clearance TBD                                 Surgeon:  DR. Rod Can Surgeon's Group or Practice Name:  [facility] Phone number:  [phone] Fax number:  [phone] ATTN: KERRI MAZE   Type of Clearance Requested:   - Medical ; ASA    Type of Anesthesia:  Spinal   Additional requests/questions:    [name]   01/05/2022, 12:39 PM

## 2022-01-07 NOTE — Telephone Encounter (Signed)
Called pt from workqueue. Pt made a NP appt with Dr. Ali Lowe 01/10/22 at 8:30am

## 2022-01-07 NOTE — Telephone Encounter (Signed)
Pt Pre operative paperwork was completed and faxed to [facility]. Pt  Placard is on DR [name] red folder waiting completion

## 2022-01-07 NOTE — Telephone Encounter (Signed)
Pt has new pt appt 01/10/22 with Dr. Ali Lowe, for pre op clearance.

## 2022-01-09 NOTE — Progress Notes (Unsigned)
Cardiology Office Note:    Date:  01/10/2022   ID:  Alex Weiss, DOB 12/22/1940, MRN [id number]  PCP:  Laurey Morale, MD   [facility] Cardiologist:  Lenna Sciara, MD Referring MD: Rod Can, MD   Chief Complaint/Reason for Referral: Preoperative cardiovascular examination  ASSESSMENT:    1. Preoperative cardiovascular examination   2. Primary hypertension   3. Coronary artery calcification seen on CAT scan   4. Aortic atherosclerosis ([place])   5. Hyperlipidemia LDL goal <70     PLAN:    In order of problems listed above: 1.  Preoperative cardiovascular assessment: Up until a few months ago the patient could perform 4 METS without any issues.  He is undergoing an orthopedic procedure under spinal and not general anesthesia.  I will obtain an echocardiogram but he is at low risk for any perioperative cardiovascular complications.  Follow-up in 9 months or earlier if needed. 2.  Hypertension: Repeat blood pressure is under good control. 3.  Coronary artery calcification: Continue aspirin 81 mg daily and start atorvastatin 20 mg daily, LDL in July was 123. 4.  Aortic atherosclerosis: See #3 above. 5.  Hyperlipidemia: See #3 above; will check lipid panel, LFTs, and LP(a) in 2 months.             Dispo:  Return in about 9 months (around 10/11/2022).      Medication Adjustments/Labs and Tests Ordered: Current medicines are reviewed at length with the patient today.  Concerns regarding medicines are outlined above.  The following changes have been made:     Labs/tests ordered: Orders Placed This Encounter  Procedures   Hepatic function panel   Lipid panel   Lipoprotein A (LPA)   EKG 12-Lead   ECHOCARDIOGRAM COMPLETE    Medication Changes: Meds ordered this encounter  Medications   atorvastatin (LIPITOR) 20 MG tablet    Sig: Take 1 tablet (20 mg total) by mouth daily.    Dispense:  90 tablet    Refill:  3     Current medicines are reviewed  at length with the patient today.  The patient does not have concerns regarding medicines.   History of Present Illness:    FOCUSED PROBLEM LIST:   1.  Hypertension 2.  Obstructive sleep apnea on CPAP 3.  Coronary and aortic atherosclerosis on chest CT 2016 3.  Hyperlipidemia  The patient is a 81 y.o. male with the indicated medical history here for recommendations regarding preoperative cardiovascular assessment prior to orthopedic surgery on his right hip which will be done with spinal anesthesia.  The patient is doing very well.  Up until a few months ago he was able to do all of his activities of daily living including yard work without any difficulties.  He denies any exertional angina or exertional dyspnea.  Few months ago he unfortunately injured his right hip and now is quite limited.  He fortunately has not required any emergency room visits or hospitalizations for cardiovascular issues.  He denies any presyncope, recurrent palpitations, paroxysmal nocturnal dyspnea, orthopnea.          Current Medications: Current Meds  Medication Sig   aspirin 81 MG tablet Take 81 mg by mouth daily.   atorvastatin (LIPITOR) 20 MG tablet Take 1 tablet (20 mg total) by mouth daily.   famotidine (PEPCID) 20 MG tablet Take 20 mg by mouth 2 (two) times daily.    losartan (COZAAR) 25 MG tablet Take 1 tablet (25 mg total)  by mouth daily.   metaxalone (SKELAXIN) 400 MG tablet Take 2 tablets (800 mg total) by mouth 3 (three) times daily as needed (muscle spasms).   Multiple Vitamin (MULTIVITAMIN) tablet Take 1 tablet by mouth daily.   NON FORMULARY    OVER THE COUNTER MEDICATION Take 1 tablet by mouth daily. Allertec daily for allergies   silodosin (RAPAFLO) 8 MG CAPS capsule Take 8 mg by mouth daily. Please sent this Rx to [facility] on [address] (90 days  supply)   sulindac (CLINORIL) 200 MG tablet Take 1 tablet (200 mg total) by mouth daily.   triamcinolone cream (KENALOG) 0.1 % Apply 1  Application topically 2 (two) times daily as needed (outter ear itching & dryness).     Allergies:    Ivp dye [iodinated contrast media], Penicillins, and Clindamycin/lincomycin   Social History:   Social History   Tobacco Use   Smoking status: Former    Packs/day: 2.00    Years: 17.00    Total pack years: 34.00    Types: Cigarettes    Quit date: 04/05/1975    Years since quitting: 46.8   Smokeless tobacco: Never  Vaping Use   Vaping Use: Never used  Substance Use Topics   Alcohol use: Yes    Alcohol/week: 7.0 standard drinks of alcohol    Types: 7 Glasses of wine per week    Comment: wine daily, occ beer   Drug use: No     Family Hx: Family History  Problem Relation Age of Onset   Aortic dissection Brother    Colon cancer Brother        mid 20's   Aortic stenosis Sister    Esophageal cancer Sister        dx age 60   Arthritis Other    Coronary artery disease Other    Depression Other    Hypertension Other    Lung cancer Other    Stroke Other    Cystic fibrosis Maternal Grandfather    Colon cancer Other    Suicidality Son    Rectal cancer Neg Hx    Stomach cancer Neg Hx    Colon polyps Neg Hx      Review of Systems:   Please see the history of present illness.    All other systems reviewed and are negative.     EKGs/Labs/Other Test Reviewed:    EKG:  EKG performed 2017 that I personally reviewed demonstrates sinus bradycardia; EKG performed today that I personally reviewed demonstrates SR with PACs.  Prior CV studies: Exercise echo stress test 2017: - Stress ECG conclusions: There were no stress arrhythmias or    conduction abnormalities. The stress ECG was normal.  - Staged echo: There was no echocardiographic evidence for    stress-induced ischemia.    Hypertension at baseline with hypertensive response to stress and    peak BP 219/81 mmHg.    Excellent exercise capacity.   TTE 2016: LVEF 55-60%, mild focal basal septal hypertrophy, normal wall     motion, normal diastolic function, mild RAE, moderate LAE,    trivial MR, mild TR, normal RVSP.   Other studies Reviewed: Review of the additional studies/records demonstrates: Chest CT 2016 with coronary and aortic atherosclerosis  Recent Labs: 10/20/2021: ALT 14; BUN 23; Creatinine, Ser 0.86; Hemoglobin 16.1; Platelets 135.0; Potassium 4.3; Sodium 141; TSH 1.32   Recent Lipid Panel Lab Results  Component Value Date/Time   CHOL 186 10/20/2021 09:36 AM   TRIG 85.0 10/20/2021  09:36 AM   HDL 45.60 10/20/2021 09:36 AM   LDLCALC 123 (H) 10/20/2021 09:36 AM   LDLCALC 110 (H) 10/14/2019 08:52 AM    Risk Assessment/Calculations:                Physical Exam:    VS:  BP 135/75   Pulse 65   Ht 6' (1.829 m)   Wt 208 lb 3.2 oz (94.4 kg)   SpO2 98%   BMI 28.24 kg/m    Wt Readings from Last 3 Encounters:  01/10/22 208 lb 3.2 oz (94.4 kg)  12/07/21 206 lb (93.4 kg)  10/20/21 206 lb (93.4 kg)    GENERAL:  No apparent distress, AOx3 HEENT:  No carotid bruits, +2 carotid impulses, no scleral icterus CAR: RRR no murmurs, gallops, rubs, or thrills RES:  Clear to auscultation bilaterally ABD:  Soft, nontender, nondistended, positive bowel sounds x 4 VASC:  +2 radial pulses, +2 carotid pulses, palpable pedal pulses NEURO:  CN 2-12 grossly intact; motor and sensory grossly intact PSYCH:  No active depression or anxiety EXT:  No edema, ecchymosis, or cyanosis  Signed, Early Osmond, MD  01/10/2022 9:13 AM    [address], [place], [place]  [address] Phone: [phone]; Fax: [phone]   Note:  This document was prepared using Dragon voice recognition software and may include unintentional dictation errors.

## 2022-01-10 ENCOUNTER — Ambulatory Visit: Payer: Medicare Other | Attending: Internal Medicine | Admitting: Internal Medicine

## 2022-01-10 ENCOUNTER — Encounter: Payer: Self-pay | Admitting: Internal Medicine

## 2022-01-10 ENCOUNTER — Ambulatory Visit (HOSPITAL_BASED_OUTPATIENT_CLINIC_OR_DEPARTMENT_OTHER): Payer: Medicare Other

## 2022-01-10 VITALS — BP 135/75 | HR 65 | Ht 72.0 in | Wt 208.2 lb

## 2022-01-10 DIAGNOSIS — Z01810 Encounter for preprocedural cardiovascular examination: Secondary | ICD-10-CM | POA: Insufficient documentation

## 2022-01-10 DIAGNOSIS — I700 Atherosclerosis of aorta: Secondary | ICD-10-CM | POA: Diagnosis not present

## 2022-01-10 DIAGNOSIS — I10 Essential (primary) hypertension: Secondary | ICD-10-CM

## 2022-01-10 DIAGNOSIS — I2510 Atherosclerotic heart disease of native coronary artery without angina pectoris: Secondary | ICD-10-CM | POA: Diagnosis not present

## 2022-01-10 DIAGNOSIS — E785 Hyperlipidemia, unspecified: Secondary | ICD-10-CM | POA: Diagnosis not present

## 2022-01-10 LAB — ECHOCARDIOGRAM COMPLETE
Area-P 1/2: 2.55 cm2
Height: 72 in
S' Lateral: 3 cm
Weight: 3331.2 oz

## 2022-01-10 MED ORDER — ATORVASTATIN CALCIUM 20 MG PO TABS: 20.0000 mg | ORAL_TABLET | Freq: Every day | ORAL | 3 refills | Status: DC

## 2022-01-10 NOTE — Telephone Encounter (Signed)
Pt is aware to pick up Placard from the office, Form was placed at the front office cabinet

## 2022-01-10 NOTE — Telephone Encounter (Signed)
Pt calling to check on progress of paperwork. Was advised pre op paperwork was faxed to [facility] and provider is still working on handicap placard form

## 2022-01-10 NOTE — Telephone Encounter (Signed)
Pt health Assessment paperwork was completed and faxed. Pt  placard has been completed and ready for pick up, pt is aware to pick up at the office

## 2022-01-10 NOTE — Patient Instructions (Signed)
Medication Instructions:  Your physician has recommended you make the following change in your medication:  1,) start atorvastatin (Lipitor) 20 mg - take one tablet daily  *If you need a refill on your cardiac medications before your next appointment, please call your pharmacy*   Lab Work: Please return in about 10 weeks - lipids, liver, Lp(a)  If you have labs (blood work) drawn today and your tests are completely normal, you will receive your results only by: [place] (if you have MyChart) OR A paper copy in the mail If you have any lab test that is abnormal or we need to change your treatment, we will call you to review the results.   Testing/Procedures: Your physician has requested that you have an echocardiogram. Echocardiography is a painless test that uses sound waves to create images of your heart. It provides your doctor with information about the size and shape of your heart and how well your heart's chambers and valves are working. This procedure takes approximately one hour. There are no restrictions for this procedure.   Follow-Up: At [facility], you and your health needs are our priority.  As part of our continuing mission to provide you with exceptional heart care, we have created designated Provider Care Teams.  These Care Teams include your primary Cardiologist (physician) and Advanced Practice Providers (APPs -  Physician Assistants and Nurse Practitioners) who all work together to provide you with the care you need, when you need it.  We recommend signing up for the patient portal called "MyChart".  Sign up information is provided on this After Visit Summary.  MyChart is used to connect with patients for Virtual Visits (Telemedicine).  Patients are able to view lab/test results, encounter notes, upcoming appointments, etc.  Non-urgent messages can be sent to your provider as well.   To learn more about what you can do with MyChart, go to  [email or web address].    Your next appointment:   9 month(s)  The format for your next appointment:   In Person  Provider:   Advanced Practice Provider (NP, PA) or Dr. Ali Lowe  Important Information About Sugar

## 2022-01-11 NOTE — Telephone Encounter (Signed)
Pt called to say he just spoke to [facility] and they are telling him they never received the Pre-Op clearance.  Pt is asking that this clearance be re-faxed, as soon as possible. Pt is also asking for a call back, once fax has been confirmed as sent.

## 2022-01-11 NOTE — Telephone Encounter (Signed)
Noted  

## 2022-01-11 NOTE — Telephone Encounter (Signed)
Pt called with an alternate fax number for [facility]  AttnMorey Hummingbird  [phone]

## 2022-01-11 NOTE — Telephone Encounter (Signed)
Patient stopped by regarding the pre op paperwork for [facility]. He stated that he wanted it faxed again to an alternative fax number. I let patient know that the message had been sent back and [facility] was seeing patients all day, it would be faxed again when they get a moment. Patient wanted to know if it could be faxed now while he was here in office.  I asked CMA, Izora Gala, who stated that she had been faxing it and the fax confirmations were stating it was going through. I relayed this information to patient who asked if he could have the paperwork to hand deliver to [facility]. I let CMA know what patient wanted and the paperwork was given to patient. A copy of the paperwork was made by CMA for scan.  Paperwork given to patient for him to drop off, as he previously stated. Nothing further needed at this time.     FYI

## 2022-02-05 ENCOUNTER — Encounter: Payer: Self-pay | Admitting: Family Medicine

## 2022-02-07 ENCOUNTER — Encounter: Payer: Self-pay | Admitting: Internal Medicine

## 2022-02-07 NOTE — Telephone Encounter (Signed)
He can simply stop taking the Lipitor

## 2022-02-17 NOTE — Progress Notes (Signed)
Sent message, via epic in basket, requesting orders in epic from surgeon.  

## 2022-02-18 ENCOUNTER — Ambulatory Visit: Payer: Self-pay | Admitting: Student

## 2022-02-22 DIAGNOSIS — L570 Actinic keratosis: Secondary | ICD-10-CM | POA: Diagnosis not present

## 2022-02-22 DIAGNOSIS — L821 Other seborrheic keratosis: Secondary | ICD-10-CM | POA: Diagnosis not present

## 2022-02-22 NOTE — Progress Notes (Addendum)
Anesthesia Review:  PCP: clearance 01/06/22  Alysia Penna  Cardiologist : DR Lenna Sciara LOV  01/10/22  for preop clearance  Chest x-ray : EKG : 01/10/22  Echo : 01/10/22  Stress test: 2017  Cardiac Cath :  Activity level: can do a flgiht of stairs without difficulty  Sleep Study/ CPAP : has cpap  Fasting Blood Sugar :      / Checks Blood Sugar -- times a day:   Blood Thinner/ Instructions /Last Dose: ASA / Instructions/ Last Dose :  81 mg aspirin

## 2022-02-22 NOTE — Patient Instructions (Signed)
SURGICAL WAITING ROOM VISITATION Patients having surgery or a procedure may have no more than 2 support people in the waiting area - these visitors may rotate.   Children under the age of 43 must have an adult with them who is not the patient. If the patient needs to stay at the hospital during part of their recovery, the visitor guidelines for inpatient rooms apply. Pre-op nurse will coordinate an appropriate time for 1 support person to accompany patient in pre-op.  This support person may not rotate.    Please refer to the [facility] website for the visitor guidelines for Inpatients (after your surgery is over and you are in a regular room).       Your procedure is scheduled on:  03/10/2022    Report to [facility] Main Entrance    Report to admitting at  [place] AM   Call this number if you have problems the morning of surgery [phone]   Do not eat food :After Midnight.   After Midnight you may have the following liquids until ___ 0430___ AM DAY OF SURGERY  Water Non-Citrus Juices (without pulp, NO RED) Carbonated Beverages Black Coffee (NO MILK/CREAM OR CREAMERS, sugar ok)  Clear Tea (NO MILK/CREAM OR CREAMERS, sugar ok) regular and decaf                             Plain Jell-O (NO RED)                                           Fruit ices (not with fruit pulp, NO RED)                                     Popsicles (NO RED)                                                               Sports drinks like Gatorade (NO RED)                      The day of surgery:  Drink ONE (1) Pre-Surgery Clear Ensure or G2 at   0430AM  ( have completed by ) the morning of surgery. Drink in one sitting. Do not sip.  This drink was given to you during your hospital  pre-op appointment visit. Nothing else to drink after completing the  Pre-Surgery Clear Ensure or G2.          If you have questions, please contact your surgeon's office.        Oral Hygiene is also important  to reduce your risk of infection.                                    Remember - BRUSH YOUR TEETH THE MORNING OF SURGERY WITH YOUR REGULAR TOOTHPASTE   Do NOT smoke after Midnight   Take these medicines the morning of surgery with A SIP OF WATER zyrtec if needed, ,  rapaflo    DO NOT TAKE ANY ORAL DIABETIC MEDICATIONS DAY OF YOUR SURGERY  Bring CPAP mask and tubing day of surgery.                              You may not have any metal on your body including hair pins, jewelry, and body piercing             Do not wear make-up, lotions, powders, perfumes/cologne, or deodorant  Do not wear nail polish including gel and S&S, artificial/acrylic nails, or any other type of covering on natural nails including finger and toenails. If you have artificial nails, gel coating, etc. that needs to be removed by a nail salon please have this removed prior to surgery or surgery may need to be canceled/ delayed if the surgeon/ anesthesia feels like they are unable to be safely monitored.   Do not shave  48 hours prior to surgery.               Men may shave face and neck.   Do not bring valuables to the hospital. [place].   Contacts, dentures or bridgework may not be worn into surgery.   Bring small overnight bag day of surgery.   DO NOT [place]. PHARMACY WILL DISPENSE MEDICATIONS LISTED ON YOUR MEDICATION LIST TO YOU DURING YOUR ADMISSION [place]!    Patients discharged on the day of surgery will not be allowed to drive home.  Someone NEEDS to stay with you for the first 24 hours after anesthesia.   Special Instructions: Bring a copy of your healthcare power of attorney and living will documents the day of surgery if you haven't scanned them before.              Please read over the following fact sheets you were given: IF [place] [phone]   If  you received a COVID test during your pre-op visit  it is requested that you wear a mask when out in public, stay away from anyone that may not be feeling well and notify your surgeon if you develop symptoms. If you test positive for Covid or have been in contact with anyone that has tested positive in the last 10 days please notify you surgeon.    [place] - Preparing for Surgery Before surgery, you can play an important role.  Because skin is not sterile, your skin needs to be as free of germs as possible.  You can reduce the number of germs on your skin by washing with CHG (chlorahexidine gluconate) soap before surgery.  CHG is an antiseptic cleaner which kills germs and bonds with the skin to continue killing germs even after washing. Please DO NOT use if you have an allergy to CHG or antibacterial soaps.  If your skin becomes reddened/irritated stop using the CHG and inform your nurse when you arrive at Short Stay. Do not shave (including legs and underarms) for at least 48 hours prior to the first CHG shower.  You may shave your face/neck. Please follow these instructions carefully:  1.  Shower with CHG Soap the night before surgery and the  morning of Surgery.  2.  If you choose to wash your hair, wash your hair  first as usual with your  normal  shampoo.  3.  After you shampoo, rinse your hair and body thoroughly to remove the  shampoo.                           4.  Use CHG as you would any other liquid soap.  You can apply chg directly  to the skin and wash                       Gently with a scrungie or clean washcloth.  5.  Apply the CHG Soap to your body ONLY FROM THE NECK DOWN.   Do not use on face/ open                           Wound or open sores. Avoid contact with eyes, ears mouth and genitals (private parts).                       Wash face,  Genitals (private parts) with your normal soap.             6.  Wash thoroughly, paying special attention to the area where your surgery   will be performed.  7.  Thoroughly rinse your body with warm water from the neck down.  8.  DO NOT shower/wash with your normal soap after using and rinsing off  the CHG Soap.                9.  Pat yourself dry with a clean towel.            10.  Wear clean pajamas.            11.  Place clean sheets on your bed the night of your first shower and do not  sleep with pets. Day of Surgery : Do not apply any lotions/deodorants the morning of surgery.  Please wear clean clothes to the hospital/surgery center.  FAILURE TO FOLLOW THESE INSTRUCTIONS MAY RESULT IN THE CANCELLATION OF YOUR SURGERY PATIENT SIGNATURE_________________________________  NURSE SIGNATURE__________________________________  ________________________________________________________________________

## 2022-02-28 ENCOUNTER — Other Ambulatory Visit: Payer: Self-pay

## 2022-02-28 ENCOUNTER — Encounter (HOSPITAL_COMMUNITY): Payer: Self-pay

## 2022-02-28 ENCOUNTER — Encounter (HOSPITAL_COMMUNITY)
Admission: RE | Admit: 2022-02-28 | Discharge: 2022-02-28 | Disposition: A | Discharge status: Home / Self Care | Payer: Medicare Other | Source: Ambulatory Visit | Attending: Orthopedic Surgery | Admitting: Orthopedic Surgery

## 2022-02-28 VITALS — BP 150/79 | HR 73 | Temp 98.3°F | Resp 16 | Ht 73.0 in | Wt 210.0 lb

## 2022-02-28 DIAGNOSIS — I10 Essential (primary) hypertension: Secondary | ICD-10-CM | POA: Diagnosis not present

## 2022-02-28 DIAGNOSIS — Z01812 Encounter for preprocedural laboratory examination: Secondary | ICD-10-CM | POA: Diagnosis not present

## 2022-02-28 DIAGNOSIS — Z01818 Encounter for other preprocedural examination: Secondary | ICD-10-CM

## 2022-02-28 HISTORY — DX: Personal history of urinary calculi: Z87.442

## 2022-02-28 LAB — CBC
HCT: 44.2 % (ref 39.0–52.0)
Hemoglobin: 15.4 g/dL (ref 13.0–17.0)
MCH: 32.9 pg (ref 26.0–34.0)
MCHC: 34.8 g/dL (ref 30.0–36.0)
MCV: 94.4 fL (ref 80.0–100.0)
Platelets: 149 10*3/uL — ABNORMAL LOW (ref 150–400)
RBC: 4.68 MIL/uL (ref 4.22–5.81)
RDW: 12.4 % (ref 11.5–15.5)
WBC: 4.6 10*3/uL (ref 4.0–10.5)
nRBC: 0 % (ref 0.0–0.2)

## 2022-02-28 LAB — BASIC METABOLIC PANEL
Anion gap: 8 (ref 5–15)
BUN: 27 mg/dL — ABNORMAL HIGH (ref 8–23)
CO2: 24 mmol/L (ref 22–32)
Calcium: 9.3 mg/dL (ref 8.9–10.3)
Chloride: 109 mmol/L (ref 98–111)
Creatinine, Ser: 0.88 mg/dL (ref 0.61–1.24)
GFR, Estimated: >60 mL/min (ref >60)
Glucose, Bld: 118 mg/dL — ABNORMAL HIGH (ref 70–99)
Potassium: 4.1 mmol/L (ref 3.5–5.1)
Sodium: 141 mmol/L (ref 135–145)

## 2022-02-28 LAB — SURGICAL PCR SCREEN
MRSA, PCR: NEGATIVE
Staphylococcus aureus: NEGATIVE

## 2022-03-02 ENCOUNTER — Ambulatory Visit: Payer: Self-pay | Admitting: Student

## 2022-03-02 NOTE — H&P (Signed)
TOTAL HIP ADMISSION H&P  Patient is admitted for right total hip arthroplasty.  Subjective:  Chief Complaint: right hip pain  HPI: Alex Weiss, 81 y.o. male, has a history of pain and functional disability in the right hip(s) due to arthritis and patient has failed non-surgical conservative treatments for greater than 12 weeks to include NSAID's and/or analgesics, flexibility and strengthening excercises, and activity modification.  Onset of symptoms was gradual starting 10 years ago with rapidlly worsening course since that time.The patient noted no past surgery on the right hip(s).  Patient currently rates pain in the right hip at 10 out of 10 with activity. Patient has night pain, worsening of pain with activity and weight bearing, trendelenberg gait, pain that interfers with activities of daily living, and pain with passive range of motion. Patient has evidence of subchondral cysts, subchondral sclerosis, periarticular osteophytes, and joint space narrowing by imaging studies. This condition presents safety issues increasing the risk of falls.  There is no current active infection.  Patient Active Problem List   Diagnosis Date Noted   OSA (obstructive sleep apnea) 01/05/2016   PVC (premature ventricular contraction) 08/11/2015   PAC (premature atrial contraction) 08/11/2015   Hyperglycemia 09/16/2014   Palpitations 06/25/2014   Hyperlipidemia 06/25/2014   HTN (hypertension) 08/15/2013   Ulcerative colitis ([place]) 04/24/2009   Osteoarthritis 04/24/2009   BPH with urinary obstruction 03/02/2009   NEPHROLITHIASIS, HX OF 08/28/2007   Past Medical History:  Diagnosis Date   Allergy    Arthritis    BPH (benign prostatic hypertrophy)    sees Dr. Irine Seal    Cataract    starting    FH: colonic polyps    GERD (gastroesophageal reflux disease)    Hematuria, microscopic    History of kidney stones    History of nephrolithiasis    1962   History of prostatitis    History of  ulcerative colitis    Hypertension    OSA (obstructive sleep apnea)    Premature atrial contraction    PVC's (premature ventricular contractions)    Sleep apnea    uses CPAP   Wears glasses     Past Surgical History:  Procedure Laterality Date   ANTERIOR CERVICAL DECOMP/DISCECTOMY FUSION  04/04/1994   C3 --  C5   BACK SURGERY     CERVICAL LAMINECTOMY  02/04/2004   C7 -- T2   COLONOSCOPY  12/13/2019   per Dr. Ardis Hughs, no polyps, no repeats needed   CYSTOSCOPY WITH BIOPSY N/A 09/05/2012   Procedure: CYSTOSCOPY WITH BIOPSY;  Surgeon: Molli Hazard, MD;  [facility];  Service: Urology;  Laterality: N/A;   LEFT QUAD TENDON REPAIR  04/04/2005   TONSILLECTOMY  AGE 48   TRACHEOSTOMY  AGE 50 MONTHS OLD   STREP THROAT   WRIST GANGLION EXCISION Right 04/04/2001    Current Outpatient Medications  Medication Sig Dispense Refill Last Dose   aspirin EC 81 MG tablet Take 81 mg by mouth daily. Swallow whole.      atorvastatin (LIPITOR) 20 MG tablet Take 1 tablet (20 mg total) by mouth daily. (Patient not taking: Reported on 02/21/2022) 90 tablet 3    Calcium Carb-Cholecalciferol (CALCIUM 600 + D PO) Take 1 tablet by mouth daily.      cetirizine (ZYRTEC) 10 MG tablet Take 10 mg by mouth daily as needed for allergies.      famotidine (PEPCID) 20 MG tablet Take 20 mg by mouth 2 (two) times daily.  ibuprofen (ADVIL) 200 MG tablet Take 200 mg by mouth every 6 (six) hours as needed for moderate pain.      losartan (COZAAR) 25 MG tablet Take 1 tablet (25 mg total) by mouth daily. 90 tablet 3    metaxalone (SKELAXIN) 400 MG tablet Take 2 tablets (800 mg total) by mouth 3 (three) times daily as needed (muscle spasms). (Patient taking differently: Take 400 mg by mouth 3 (three) times daily as needed (muscle spasms).) 120 tablet 3    metroNIDAZOLE (METROGEL) 0.75 % gel Apply 1 Application topically 2 (two) times daily as needed (rosacea).      Multiple Vitamin  (MULTIVITAMIN) tablet Take 1 tablet by mouth daily.      silodosin (RAPAFLO) 8 MG CAPS capsule Take 8 mg by mouth daily.      sulindac (CLINORIL) 200 MG tablet Take 1 tablet (200 mg total) by mouth daily. 90 tablet 3    triamcinolone cream (KENALOG) 0.1 % Apply 1 Application topically 2 (two) times daily as needed (outter ear itching & dryness). 90 g 5    No current facility-administered medications for this visit.   Allergies  Allergen Reactions   Ivp Dye [Iodinated Contrast Media] Hives   Penicillins Hives   Clindamycin/Lincomycin Rash    Social History   Tobacco Use   Smoking status: Former    Packs/day: 2.00    Years: 17.00    Total pack years: 34.00    Types: Cigarettes    Quit date: 04/05/1975    Years since quitting: 46.9   Smokeless tobacco: Never  Substance Use Topics   Alcohol use: Yes    Alcohol/week: 7.0 standard drinks of alcohol    Types: 7 Glasses of wine per week    Comment: wine daily, occ beer    Family History  Problem Relation Age of Onset   Aortic dissection Brother    Colon cancer Brother        mid 63's   Aortic stenosis Sister    Esophageal cancer Sister        dx age 96   Arthritis Other    Coronary artery disease Other    Depression Other    Hypertension Other    Lung cancer Other    Stroke Other    Cystic fibrosis Maternal Grandfather    Colon cancer Other    Suicidality Son    Rectal cancer Neg Hx    Stomach cancer Neg Hx    Colon polyps Neg Hx      Review of Systems  Musculoskeletal:  Positive for arthralgias, back pain and gait problem.  All other systems reviewed and are negative.   Objective:  Physical Exam Constitutional:      Appearance: Normal appearance.  HENT:     Head: Normocephalic and atraumatic.     Nose: Nose normal.     Mouth/Throat:     Mouth: Mucous membranes are moist.     Pharynx: Oropharynx is clear.  Eyes:     Conjunctiva/sclera: Conjunctivae normal.  Cardiovascular:     Rate and Rhythm: Normal rate  and regular rhythm.     Pulses: Normal pulses.     Heart sounds: Normal heart sounds.  Pulmonary:     Effort: Pulmonary effort is normal.     Breath sounds: Normal breath sounds.  Abdominal:     General: Abdomen is flat.     Palpations: Abdomen is soft.  Genitourinary:    Comments: Deferred. Musculoskeletal:  Cervical back: Normal range of motion and neck supple.     Comments: Examination of the right hip reveals no skin wounds or lesions. No trochanteric tenderness to palpation. He has severely restricted range of motion of the right hip. He has a 25 degree flexion contracture, and I can flex him up to 80 degrees. He internally rotates 0 degrees, and externally rotates 10 degrees. Pain with terminal flexion and rotation.   He is neurovascular intact distally.  Mild edema in LE bilaterally. Calves soft and non-tende  Skin:    General: Skin is warm and dry.     Capillary Refill: Capillary refill takes less than 2 seconds.  Neurological:     General: No focal deficit present.     Mental Status: He is alert and oriented to person, place, and time.  Psychiatric:        Mood and Affect: Mood normal.        Behavior: Behavior normal.        Thought Content: Thought content normal.        Judgment: Judgment normal.     Vital signs in last 24 hours: '@VSRANGES'$ @  Labs:   Estimated body mass index is 27.71 kg/m as calculated from the following:   Height as of 02/28/22: '6\' 1"'$  (1.854 m).   Weight as of 02/28/22: 95.3 kg.   Imaging Review Plain radiographs demonstrate severe degenerative joint disease of the right hip(s). The bone quality appears to be adequate for age and reported activity level.      Assessment/Plan:  End stage arthritis, right hip(s)  The patient history, physical examination, clinical judgement of the provider and imaging studies are consistent with end stage degenerative joint disease of the right hip(s) and total hip arthroplasty is deemed medically  necessary. The treatment options including medical management, injection therapy, arthroscopy and arthroplasty were discussed at length. The risks and benefits of total hip arthroplasty were presented and reviewed. The risks due to aseptic loosening, infection, stiffness, dislocation/subluxation,  thromboembolic complications and other imponderables were discussed.  The patient acknowledged the explanation, agreed to proceed with the plan and consent was signed. Patient is being admitted for inpatient treatment for surgery, pain control, PT, OT, prophylactic antibiotics, VTE prophylaxis, progressive ambulation and ADL's and discharge planning.The patient is planning to be discharged home with HEP.  Therapy Plans: HEP.  Disposition: Home with wife Planned DVT Prophylaxis: aspirin '81mg'$  BID DME needed: Has rolling walker.  PCP: Cleared, stop ASA 5 days prior to surgery.  Cardiology: Cleared. TXA: IV Allergies:  - Clindamycin - flushing - Iodinated contrast media - hives - Penicillin - Hives.  Anesthesia Concerns: None.  BMI: 29.2 Last HgbA1c: 5.7 Other: - CAD - ASA '81mg'$  at baseline - Chronic low back pain - OSA, uses CPAP - Hydrocodone, zofran - sulindac - NSAID - Cr. 0.86, Hgb 16.1, K+ 4.3   Patient's anticipated LOS is less than 2 midnights, meeting these requirements: - Younger than 70 - Lives within 1 hour of care - Has a competent adult at home to recover with post-op recover - NO history of  - Chronic pain requiring opiods  - Diabetes  - Coronary Artery Disease  - Heart failure  - Heart attack  - Stroke  - DVT/VTE  - Cardiac arrhythmia  - Respiratory Failure/COPD  - Renal failure  - Anemia  - Advanced Liver disease

## 2022-03-02 NOTE — H&P (View-Only) (Signed)
TOTAL HIP ADMISSION H&P  Patient is admitted for right total hip arthroplasty.  Subjective:  Chief Complaint: right hip pain  HPI: Alex Weiss, 81 y.o. male, has a history of pain and functional disability in the right hip(s) due to arthritis and patient has failed non-surgical conservative treatments for greater than 12 weeks to include NSAID's and/or analgesics, flexibility and strengthening excercises, and activity modification.  Onset of symptoms was gradual starting 10 years ago with rapidlly worsening course since that time.The patient noted no past surgery on the right hip(s).  Patient currently rates pain in the right hip at 10 out of 10 with activity. Patient has night pain, worsening of pain with activity and weight bearing, trendelenberg gait, pain that interfers with activities of daily living, and pain with passive range of motion. Patient has evidence of subchondral cysts, subchondral sclerosis, periarticular osteophytes, and joint space narrowing by imaging studies. This condition presents safety issues increasing the risk of falls.  There is no current active infection.  Patient Active Problem List   Diagnosis Date Noted   OSA (obstructive sleep apnea) 01/05/2016   PVC (premature ventricular contraction) 08/11/2015   PAC (premature atrial contraction) 08/11/2015   Hyperglycemia 09/16/2014   Palpitations 06/25/2014   Hyperlipidemia 06/25/2014   HTN (hypertension) 08/15/2013   Ulcerative colitis ([place]) 04/24/2009   Osteoarthritis 04/24/2009   BPH with urinary obstruction 03/02/2009   NEPHROLITHIASIS, HX OF 08/28/2007   Past Medical History:  Diagnosis Date   Allergy    Arthritis    BPH (benign prostatic hypertrophy)    sees Dr. Irine Seal    Cataract    starting    FH: colonic polyps    GERD (gastroesophageal reflux disease)    Hematuria, microscopic    History of kidney stones    History of nephrolithiasis    1962   History of prostatitis    History of  ulcerative colitis    Hypertension    OSA (obstructive sleep apnea)    Premature atrial contraction    PVC's (premature ventricular contractions)    Sleep apnea    uses CPAP   Wears glasses     Past Surgical History:  Procedure Laterality Date   ANTERIOR CERVICAL DECOMP/DISCECTOMY FUSION  04/04/1994   C3 --  C5   BACK SURGERY     CERVICAL LAMINECTOMY  02/04/2004   C7 -- T2   COLONOSCOPY  12/13/2019   per Dr. Ardis Hughs, no polyps, no repeats needed   CYSTOSCOPY WITH BIOPSY N/A 09/05/2012   Procedure: CYSTOSCOPY WITH BIOPSY;  Surgeon: Molli Hazard, MD;  Location: [facility];  Service: Urology;  Laterality: N/A;   LEFT QUAD TENDON REPAIR  04/04/2005   TONSILLECTOMY  AGE 60   TRACHEOSTOMY  AGE 63 MONTHS OLD   STREP THROAT   WRIST GANGLION EXCISION Right 04/04/2001    Current Outpatient Medications  Medication Sig Dispense Refill Last Dose   aspirin EC 81 MG tablet Take 81 mg by mouth daily. Swallow whole.      atorvastatin (LIPITOR) 20 MG tablet Take 1 tablet (20 mg total) by mouth daily. (Patient not taking: Reported on 02/21/2022) 90 tablet 3    Calcium Carb-Cholecalciferol (CALCIUM 600 + D PO) Take 1 tablet by mouth daily.      cetirizine (ZYRTEC) 10 MG tablet Take 10 mg by mouth daily as needed for allergies.      famotidine (PEPCID) 20 MG tablet Take 20 mg by mouth 2 (two) times daily.  ibuprofen (ADVIL) 200 MG tablet Take 200 mg by mouth every 6 (six) hours as needed for moderate pain.      losartan (COZAAR) 25 MG tablet Take 1 tablet (25 mg total) by mouth daily. 90 tablet 3    metaxalone (SKELAXIN) 400 MG tablet Take 2 tablets (800 mg total) by mouth 3 (three) times daily as needed (muscle spasms). (Patient taking differently: Take 400 mg by mouth 3 (three) times daily as needed (muscle spasms).) 120 tablet 3    metroNIDAZOLE (METROGEL) 0.75 % gel Apply 1 Application topically 2 (two) times daily as needed (rosacea).      Multiple Vitamin  (MULTIVITAMIN) tablet Take 1 tablet by mouth daily.      silodosin (RAPAFLO) 8 MG CAPS capsule Take 8 mg by mouth daily.      sulindac (CLINORIL) 200 MG tablet Take 1 tablet (200 mg total) by mouth daily. 90 tablet 3    triamcinolone cream (KENALOG) 0.1 % Apply 1 Application topically 2 (two) times daily as needed (outter ear itching & dryness). 90 g 5    No current facility-administered medications for this visit.   Allergies  Allergen Reactions   Ivp Dye [Iodinated Contrast Media] Hives   Penicillins Hives   Clindamycin/Lincomycin Rash    Social History   Tobacco Use   Smoking status: Former    Packs/day: 2.00    Years: 17.00    Total pack years: 34.00    Types: Cigarettes    Quit date: 04/05/1975    Years since quitting: 46.9   Smokeless tobacco: Never  Substance Use Topics   Alcohol use: Yes    Alcohol/week: 7.0 standard drinks of alcohol    Types: 7 Glasses of wine per week    Comment: wine daily, occ beer    Family History  Problem Relation Age of Onset   Aortic dissection Brother    Colon cancer Brother        mid 15's   Aortic stenosis Sister    Esophageal cancer Sister        dx age 69   Arthritis Other    Coronary artery disease Other    Depression Other    Hypertension Other    Lung cancer Other    Stroke Other    Cystic fibrosis Maternal Grandfather    Colon cancer Other    Suicidality Son    Rectal cancer Neg Hx    Stomach cancer Neg Hx    Colon polyps Neg Hx      Review of Systems  Musculoskeletal:  Positive for arthralgias, back pain and gait problem.  All other systems reviewed and are negative.   Objective:  Physical Exam Constitutional:      Appearance: Normal appearance.  HENT:     Head: Normocephalic and atraumatic.     Nose: Nose normal.     Mouth/Throat:     Mouth: Mucous membranes are moist.     Pharynx: Oropharynx is clear.  Eyes:     Conjunctiva/sclera: Conjunctivae normal.  Cardiovascular:     Rate and Rhythm: Normal rate  and regular rhythm.     Pulses: Normal pulses.     Heart sounds: Normal heart sounds.  Pulmonary:     Effort: Pulmonary effort is normal.     Breath sounds: Normal breath sounds.  Abdominal:     General: Abdomen is flat.     Palpations: Abdomen is soft.  Genitourinary:    Comments: Deferred. Musculoskeletal:  Cervical back: Normal range of motion and neck supple.     Comments: Examination of the right hip reveals no skin wounds or lesions. No trochanteric tenderness to palpation. He has severely restricted range of motion of the right hip. He has a 25 degree flexion contracture, and I can flex him up to 80 degrees. He internally rotates 0 degrees, and externally rotates 10 degrees. Pain with terminal flexion and rotation.   He is neurovascular intact distally.  Mild edema in LE bilaterally. Calves soft and non-tende  Skin:    General: Skin is warm and dry.     Capillary Refill: Capillary refill takes less than 2 seconds.  Neurological:     General: No focal deficit present.     Mental Status: He is alert and oriented to person, place, and time.  Psychiatric:        Mood and Affect: Mood normal.        Behavior: Behavior normal.        Thought Content: Thought content normal.        Judgment: Judgment normal.     Vital signs in last 24 hours: '@VSRANGES'$ @  Labs:   Estimated body mass index is 27.71 kg/m as calculated from the following:   Height as of 02/28/22: '6\' 1"'$  (1.854 m).   Weight as of 02/28/22: 95.3 kg.   Imaging Review Plain radiographs demonstrate severe degenerative joint disease of the right hip(s). The bone quality appears to be adequate for age and reported activity level.      Assessment/Plan:  End stage arthritis, right hip(s)  The patient history, physical examination, clinical judgement of the provider and imaging studies are consistent with end stage degenerative joint disease of the right hip(s) and total hip arthroplasty is deemed medically  necessary. The treatment options including medical management, injection therapy, arthroscopy and arthroplasty were discussed at length. The risks and benefits of total hip arthroplasty were presented and reviewed. The risks due to aseptic loosening, infection, stiffness, dislocation/subluxation,  thromboembolic complications and other imponderables were discussed.  The patient acknowledged the explanation, agreed to proceed with the plan and consent was signed. Patient is being admitted for inpatient treatment for surgery, pain control, PT, OT, prophylactic antibiotics, VTE prophylaxis, progressive ambulation and ADL's and discharge planning.The patient is planning to be discharged home with HEP.  Therapy Plans: HEP.  Disposition: Home with wife Planned DVT Prophylaxis: aspirin '81mg'$  BID DME needed: Has rolling walker.  PCP: Cleared, stop ASA 5 days prior to surgery.  Cardiology: Cleared. TXA: IV Allergies:  - Clindamycin - flushing - Iodinated contrast media - hives - Penicillin - Hives.  Anesthesia Concerns: None.  BMI: 29.2 Last HgbA1c: 5.7 Other: - CAD - ASA '81mg'$  at baseline - Chronic low back pain - OSA, uses CPAP - Hydrocodone, zofran - sulindac - NSAID - Cr. 0.86, Hgb 16.1, K+ 4.3   Patient's anticipated LOS is less than 2 midnights, meeting these requirements: - Younger than 25 - Lives within 1 hour of care - Has a competent adult at home to recover with post-op recover - NO history of  - Chronic pain requiring opiods  - Diabetes  - Coronary Artery Disease  - Heart failure  - Heart attack  - Stroke  - DVT/VTE  - Cardiac arrhythmia  - Respiratory Failure/COPD  - Renal failure  - Anemia  - Advanced Liver disease

## 2022-03-03 NOTE — Progress Notes (Signed)
Anesthesia Chart Review   Case: [id number] Date/Time: 03/10/22 0715   Procedure: TOTAL HIP ARTHROPLASTY ANTERIOR APPROACH (Right: Hip) - 150   Anesthesia type: Spinal   Pre-op diagnosis: Right hip osteoarthritis   Location: [facility] ROOM 08 / [facility]   Surgeons: Rod Can, MD       DISCUSSION:81 y.o. former smoker with h/o HTN, sleep apnea, OSA, PVCs, right hip OA scheduled for above procedure 03/10/2022 with Dr. Rod Can.   Pt seen by cardiology for preoperative evaluation 01/10/2022. Per OV note, "Preoperative cardiovascular assessment: Up until a few months ago the patient could perform 4 METS without any issues.  He is undergoing an orthopedic procedure under spinal and not general anesthesia.  I will obtain an echocardiogram but he is at low risk for any perioperative cardiovascular complications.  Follow-up in 9 months or earlier if needed. "  Echo 01/10/2022 with EF 56%, trivial mitral valve regurgitation, tricuspid valve regurgitation mild to moderate, no aortic stenosis.   VS: BP (!) 150/79   Pulse 73   Temp 36.8 C (Oral)   Resp 16   Ht '6\' 1"'$  (1.854 m)   Wt 95.3 kg   SpO2 96%   BMI 27.71 kg/m   PROVIDERS: Laurey Morale, MD is PCP   Lenna Sciara, MD I sCardiologist  LABS: Labs reviewed: Acceptable for surgery. (all labs ordered are listed, but only abnormal results are displayed)  Labs Reviewed  BASIC METABOLIC PANEL - Abnormal; Notable for the following components:      Result Value   Glucose, Bld 118 (*)    BUN 27 (*)    All other components within normal limits  CBC - Abnormal; Notable for the following components:   Platelets 149 (*)    All other components within normal limits  SURGICAL PCR SCREEN  TYPE AND SCREEN     IMAGES:   EKG:   CV: Echo 01/10/2022 1. Left ventricular ejection fraction by 3D volume is 56 %. The left  ventricle has normal function. The left ventricle has no regional wall  motion abnormalities. There is mild concentric  left ventricular  hypertrophy. Left ventricular diastolic  parameters are consistent with Grade I diastolic dysfunction (impaired  relaxation).   2. Right ventricular systolic function is normal. The right ventricular  size is normal. There is normal pulmonary artery systolic pressure.   3. The mitral valve is normal in structure. Trivial mitral valve  regurgitation. No evidence of mitral stenosis.   4. Tricuspid valve regurgitation is mild to moderate.   5. The aortic valve is normal in structure. Aortic valve regurgitation is  not visualized. No aortic stenosis is present.   6. The inferior vena cava is normal in size with greater than 50%  respiratory variability, suggesting right atrial pressure of 3 mmHg.  Past Medical History:  Diagnosis Date   Allergy    Arthritis    BPH (benign prostatic hypertrophy)    sees Dr. Irine Seal    Cataract    starting    FH: colonic polyps    GERD (gastroesophageal reflux disease)    Hematuria, microscopic    History of kidney stones    History of nephrolithiasis    1962   History of prostatitis    History of ulcerative colitis    Hypertension    OSA (obstructive sleep apnea)    Premature atrial contraction    PVC's (premature ventricular contractions)    Sleep apnea    uses CPAP  Wears glasses     Past Surgical History:  Procedure Laterality Date   ANTERIOR CERVICAL DECOMP/DISCECTOMY FUSION  04/04/1994   C3 --  C5   BACK SURGERY     CERVICAL LAMINECTOMY  02/04/2004   C7 -- T2   COLONOSCOPY  12/13/2019   per Dr. Ardis Hughs, no polyps, no repeats needed   CYSTOSCOPY WITH BIOPSY N/A 09/05/2012   Procedure: CYSTOSCOPY WITH BIOPSY;  Surgeon: Molli Hazard, MD;  Location: [facility];  Service: Urology;  Laterality: N/A;   LEFT QUAD TENDON REPAIR  04/04/2005   TONSILLECTOMY  AGE 25   TRACHEOSTOMY  AGE 45 MONTHS OLD   STREP THROAT   WRIST GANGLION EXCISION Right 04/04/2001    MEDICATIONS:  aspirin EC 81 MG  tablet   atorvastatin (LIPITOR) 20 MG tablet   Calcium Carb-Cholecalciferol (CALCIUM 600 + D PO)   cetirizine (ZYRTEC) 10 MG tablet   famotidine (PEPCID) 20 MG tablet   ibuprofen (ADVIL) 200 MG tablet   losartan (COZAAR) 25 MG tablet   metaxalone (SKELAXIN) 400 MG tablet   metroNIDAZOLE (METROGEL) 0.75 % gel   Multiple Vitamin (MULTIVITAMIN) tablet   silodosin (RAPAFLO) 8 MG CAPS capsule   sulindac (CLINORIL) 200 MG tablet   triamcinolone cream (KENALOG) 0.1 %   No current facility-administered medications for this encounter.     Alex Felix Ward, PA-C [facility] [phone]

## 2022-03-09 NOTE — Anesthesia Preprocedure Evaluation (Signed)
Anesthesia Evaluation  Patient identified by MRN, date of birth, ID band Patient awake    Reviewed: Allergy & Precautions, NPO status , Patient's Chart, lab work & pertinent test results  Airway Mallampati: II  TM Distance: >3 FB Neck ROM: Full    Dental  (+) Dental Advisory Given, Teeth Intact   Pulmonary sleep apnea , former smoker   Pulmonary exam normal breath sounds clear to auscultation       Cardiovascular hypertension, Pt. on medications Normal cardiovascular exam+ Valvular Problems/Murmurs (mild-mod TR) MR  Rhythm:Regular Rate:Normal  Echo 10/22023   1. Left ventricular ejection fraction by 3D volume is 56 %. The left ventricle has normal function. The left ventricle has no regional wall motion abnormalities. There is mild concentric left ventricular hypertrophy. Left ventricular diastolic parameters are consistent with Grade I diastolic dysfunction (impaired relaxation).   2. Right ventricular systolic function is normal. The right ventricular size is normal. There is normal pulmonary artery systolic pressure.   3. The mitral valve is normal in structure. Trivial mitral valve regurgitation. No evidence of mitral stenosis.   4. Tricuspid valve regurgitation is mild to moderate.   5. The aortic valve is normal in structure. Aortic valve regurgitation is not visualized. No aortic stenosis is present.   6. The inferior vena cava is normal in size with greater than 50% respiratory variability, suggesting right atrial pressure of 3 mmHg.      Neuro/Psych negative neurological ROS     GI/Hepatic Neg liver ROS, PUD,GERD  Medicated,,  Endo/Other  negative endocrine ROS    Renal/GU negative Renal ROS     Musculoskeletal  (+) Arthritis ,    Abdominal   Peds  Hematology negative hematology ROS (+)   Anesthesia Other Findings   Reproductive/Obstetrics                             Anesthesia  Physical Anesthesia Plan  ASA: 3  Anesthesia Plan: Spinal   Post-op Pain Management: Tylenol PO (pre-op)* and Celebrex PO (pre-op)*   Induction: Intravenous  PONV Risk Score and Plan: 2 and Ondansetron, Dexamethasone, Treatment may vary due to age or medical condition, Propofol infusion and TIVA  Airway Management Planned: Natural Airway  Additional Equipment:   Intra-op Plan:   Post-operative Plan:   Informed Consent: I have reviewed the patients History and Physical, chart, labs and discussed the procedure including the risks, benefits and alternatives for the proposed anesthesia with the patient or authorized representative who has indicated his/her understanding and acceptance.     Dental advisory given  Plan Discussed with: CRNA  Anesthesia Plan Comments:         Anesthesia Quick Evaluation

## 2022-03-10 ENCOUNTER — Encounter (HOSPITAL_COMMUNITY)
Admission: RE | Disposition: A | Discharge status: Home / Self Care | Payer: Self-pay | Source: Ambulatory Visit | Attending: Orthopedic Surgery

## 2022-03-10 ENCOUNTER — Encounter (HOSPITAL_COMMUNITY): Payer: Self-pay | Admitting: Orthopedic Surgery

## 2022-03-10 ENCOUNTER — Ambulatory Visit (HOSPITAL_COMMUNITY): Payer: Medicare Other

## 2022-03-10 ENCOUNTER — Other Ambulatory Visit: Payer: Self-pay

## 2022-03-10 ENCOUNTER — Ambulatory Visit (HOSPITAL_COMMUNITY)
Admission: RE | Admit: 2022-03-10 | Discharge: 2022-03-10 | Disposition: A | Discharge status: Home / Self Care | Payer: Medicare Other | Source: Ambulatory Visit | Attending: Orthopedic Surgery | Admitting: Orthopedic Surgery

## 2022-03-10 ENCOUNTER — Ambulatory Visit (HOSPITAL_BASED_OUTPATIENT_CLINIC_OR_DEPARTMENT_OTHER): Payer: Medicare Other | Admitting: Certified Registered Nurse Anesthetist

## 2022-03-10 ENCOUNTER — Ambulatory Visit (HOSPITAL_COMMUNITY): Payer: Medicare Other | Admitting: Physician Assistant

## 2022-03-10 DIAGNOSIS — Z96641 Presence of right artificial hip joint: Secondary | ICD-10-CM | POA: Insufficient documentation

## 2022-03-10 DIAGNOSIS — I119 Hypertensive heart disease without heart failure: Secondary | ICD-10-CM

## 2022-03-10 DIAGNOSIS — Z01818 Encounter for other preprocedural examination: Secondary | ICD-10-CM

## 2022-03-10 DIAGNOSIS — M1611 Unilateral primary osteoarthritis, right hip: Secondary | ICD-10-CM | POA: Insufficient documentation

## 2022-03-10 DIAGNOSIS — G4730 Sleep apnea, unspecified: Secondary | ICD-10-CM | POA: Diagnosis not present

## 2022-03-10 DIAGNOSIS — Z8711 Personal history of peptic ulcer disease: Secondary | ICD-10-CM | POA: Diagnosis not present

## 2022-03-10 DIAGNOSIS — K219 Gastro-esophageal reflux disease without esophagitis: Secondary | ICD-10-CM | POA: Insufficient documentation

## 2022-03-10 DIAGNOSIS — I10 Essential (primary) hypertension: Secondary | ICD-10-CM | POA: Diagnosis not present

## 2022-03-10 DIAGNOSIS — Z87891 Personal history of nicotine dependence: Secondary | ICD-10-CM | POA: Diagnosis not present

## 2022-03-10 DIAGNOSIS — Z471 Aftercare following joint replacement surgery: Secondary | ICD-10-CM | POA: Diagnosis not present

## 2022-03-10 HISTORY — PX: TOTAL HIP ARTHROPLASTY: SHX124

## 2022-03-10 LAB — ABO/RH: ABO/RH(D): O POS

## 2022-03-10 LAB — TYPE AND SCREEN
ABO/RH(D): O POS
Antibody Screen: NEGATIVE

## 2022-03-10 SURGERY — ARTHROPLASTY, HIP, TOTAL, ANTERIOR APPROACH
Anesthesia: Spinal | Site: Hip | Laterality: Right

## 2022-03-10 MED ORDER — POLYETHYLENE GLYCOL 3350 17 G PO PACK: 17.0000 g | PACK | Freq: Every day | ORAL | 0 refills | Status: AC | PRN

## 2022-03-10 MED ORDER — BUPIVACAINE IN DEXTROSE 0.75-8.25 % IT SOLN
INTRATHECAL | Status: DC | PRN
  Administered 2022-03-10: 1.8 mL via INTRATHECAL

## 2022-03-10 MED ORDER — HYDROCODONE-ACETAMINOPHEN 7.5-325 MG PO TABS: 1.0000 | ORAL_TABLET | ORAL | Status: DC | PRN

## 2022-03-10 MED ORDER — SENNA 8.6 MG PO TABS: 2.0000 | ORAL_TABLET | Freq: Every day | ORAL | 0 refills | Status: AC

## 2022-03-10 MED ORDER — SODIUM CHLORIDE (PF) 0.9 % IJ SOLN
INTRAMUSCULAR | Status: DC | PRN
  Administered 2022-03-10: 30 mL

## 2022-03-10 MED ORDER — ACETAMINOPHEN 325 MG PO TABS: 325.0000 mg | ORAL_TABLET | Freq: Four times a day (QID) | ORAL | Status: DC | PRN

## 2022-03-10 MED ORDER — CEFAZOLIN SODIUM-DEXTROSE 2-4 GM/100ML-% IV SOLN
2.0000 g | Freq: Four times a day (QID) | INTRAVENOUS | Status: DC
  Administered 2022-03-10: 2 g via INTRAVENOUS

## 2022-03-10 MED ORDER — CELECOXIB 200 MG PO CAPS
200.0000 mg | ORAL_CAPSULE | Freq: Once | ORAL | Status: AC
  Administered 2022-03-10: 200 mg via ORAL
  Filled 2022-03-10: qty 1

## 2022-03-10 MED ORDER — SODIUM CHLORIDE (PF) 0.9 % IJ SOLN
INTRAMUSCULAR | Status: AC
  Filled 2022-03-10: qty 30

## 2022-03-10 MED ORDER — METHOCARBAMOL 500 MG IVPB - SIMPLE MED
500.0000 mg | Freq: Four times a day (QID) | INTRAVENOUS | Status: DC | PRN
  Filled 2022-03-10: qty 55

## 2022-03-10 MED ORDER — MORPHINE SULFATE (PF) 2 MG/ML IV SOLN: 0.5000 mg | INTRAVENOUS | Status: DC | PRN

## 2022-03-10 MED ORDER — CEFAZOLIN SODIUM-DEXTROSE 2-4 GM/100ML-% IV SOLN
INTRAVENOUS | Status: AC
  Filled 2022-03-10: qty 100

## 2022-03-10 MED ORDER — ISOPROPYL ALCOHOL 70 % SOLN
Status: AC
  Filled 2022-03-10: qty 480

## 2022-03-10 MED ORDER — LACTATED RINGERS IV SOLN: INTRAVENOUS | Status: DC

## 2022-03-10 MED ORDER — HYDROCODONE-ACETAMINOPHEN 5-325 MG PO TABS
ORAL_TABLET | ORAL | Status: AC
  Filled 2022-03-10: qty 1

## 2022-03-10 MED ORDER — POVIDONE-IODINE 10 % EX SWAB: 2.0000 | Freq: Once | CUTANEOUS | Status: DC

## 2022-03-10 MED ORDER — MEPERIDINE HCL 50 MG/ML IJ SOLN: 6.2500 mg | INTRAMUSCULAR | Status: DC | PRN

## 2022-03-10 MED ORDER — CEFAZOLIN SODIUM-DEXTROSE 2-4 GM/100ML-% IV SOLN
2.0000 g | INTRAVENOUS | Status: AC
  Administered 2022-03-10: 2 g via INTRAVENOUS
  Filled 2022-03-10: qty 100

## 2022-03-10 MED ORDER — POVIDONE-IODINE 10 % EX SWAB
2.0000 | Freq: Once | CUTANEOUS | Status: AC
  Administered 2022-03-10: 2 via TOPICAL

## 2022-03-10 MED ORDER — ISOPROPYL ALCOHOL 70 % SOLN
Status: DC | PRN
  Administered 2022-03-10: 1 via TOPICAL

## 2022-03-10 MED ORDER — ONDANSETRON HCL 4 MG PO TABS
4.0000 mg | ORAL_TABLET | Freq: Four times a day (QID) | ORAL | Status: DC | PRN
  Filled 2022-03-10: qty 1

## 2022-03-10 MED ORDER — ASPIRIN 81 MG PO CHEW: 81.0000 mg | CHEWABLE_TABLET | Freq: Two times a day (BID) | ORAL | 0 refills | Status: AC

## 2022-03-10 MED ORDER — OXYCODONE HCL 5 MG/5ML PO SOLN: 5.0000 mg | Freq: Once | ORAL | Status: AC | PRN

## 2022-03-10 MED ORDER — LACTATED RINGERS IV BOLUS
250.0000 mL | Freq: Once | INTRAVENOUS | Status: AC
  Administered 2022-03-10: 250 mL via INTRAVENOUS

## 2022-03-10 MED ORDER — ACETAMINOPHEN 500 MG PO TABS: 1000.0000 mg | ORAL_TABLET | Freq: Once | ORAL | Status: DC

## 2022-03-10 MED ORDER — HYDROMORPHONE HCL 1 MG/ML IJ SOLN: 0.2500 mg | INTRAMUSCULAR | Status: DC | PRN

## 2022-03-10 MED ORDER — PROMETHAZINE HCL 25 MG/ML IJ SOLN: 6.2500 mg | INTRAMUSCULAR | Status: DC | PRN

## 2022-03-10 MED ORDER — ACETAMINOPHEN 500 MG PO TABS
500.0000 mg | ORAL_TABLET | Freq: Four times a day (QID) | ORAL | Status: DC
  Administered 2022-03-10: 500 mg via ORAL

## 2022-03-10 MED ORDER — HYDROCODONE-ACETAMINOPHEN 10-325 MG PO TABS: 0.5000 | ORAL_TABLET | ORAL | 0 refills | Status: AC | PRN

## 2022-03-10 MED ORDER — SODIUM CHLORIDE 0.9 % IR SOLN
Status: DC | PRN
  Administered 2022-03-10: 1000 mL
  Administered 2022-03-10: 3000 mL
  Administered 2022-03-10: 1000 mL

## 2022-03-10 MED ORDER — METOCLOPRAMIDE HCL 5 MG/ML IJ SOLN: 5.0000 mg | Freq: Three times a day (TID) | INTRAMUSCULAR | Status: DC | PRN

## 2022-03-10 MED ORDER — CELECOXIB 200 MG PO CAPS
ORAL_CAPSULE | ORAL | Status: AC
  Filled 2022-03-10: qty 1

## 2022-03-10 MED ORDER — PHENYLEPHRINE HCL-NACL 20-0.9 MG/250ML-% IV SOLN
INTRAVENOUS | Status: DC | PRN
  Administered 2022-03-10: 20 ug/min via INTRAVENOUS

## 2022-03-10 MED ORDER — DOCUSATE SODIUM 100 MG PO CAPS: 100.0000 mg | ORAL_CAPSULE | Freq: Two times a day (BID) | ORAL | 0 refills | Status: AC

## 2022-03-10 MED ORDER — FENTANYL CITRATE (PF) 250 MCG/5ML IJ SOLN
INTRAMUSCULAR | Status: DC | PRN
  Administered 2022-03-10: 50 ug via INTRAVENOUS

## 2022-03-10 MED ORDER — KETOROLAC TROMETHAMINE 30 MG/ML IJ SOLN
INTRAMUSCULAR | Status: DC | PRN
  Administered 2022-03-10: 30 mg via INTRAMUSCULAR

## 2022-03-10 MED ORDER — ACETAMINOPHEN 500 MG PO TABS
1000.0000 mg | ORAL_TABLET | Freq: Once | ORAL | Status: AC
  Administered 2022-03-10: 1000 mg via ORAL
  Filled 2022-03-10: qty 2

## 2022-03-10 MED ORDER — BUPIVACAINE-EPINEPHRINE (PF) 0.5% -1:200000 IJ SOLN
INTRAMUSCULAR | Status: DC | PRN
  Administered 2022-03-10: 30 mL

## 2022-03-10 MED ORDER — KETOROLAC TROMETHAMINE 30 MG/ML IJ SOLN
INTRAMUSCULAR | Status: AC
  Filled 2022-03-10: qty 1

## 2022-03-10 MED ORDER — CHLORHEXIDINE GLUCONATE 0.12 % MT SOLN
15.0000 mL | Freq: Once | OROMUCOSAL | Status: AC
  Administered 2022-03-10: 15 mL via OROMUCOSAL

## 2022-03-10 MED ORDER — OXYCODONE HCL 5 MG PO TABS
ORAL_TABLET | ORAL | Status: AC
  Administered 2022-03-10: 5 mg via ORAL
  Filled 2022-03-10: qty 1

## 2022-03-10 MED ORDER — TRANEXAMIC ACID-NACL 1000-0.7 MG/100ML-% IV SOLN
1000.0000 mg | INTRAVENOUS | Status: AC
  Administered 2022-03-10: 1000 mg via INTRAVENOUS
  Filled 2022-03-10: qty 100

## 2022-03-10 MED ORDER — METOCLOPRAMIDE HCL 5 MG PO TABS
5.0000 mg | ORAL_TABLET | Freq: Three times a day (TID) | ORAL | Status: DC | PRN
  Filled 2022-03-10: qty 2

## 2022-03-10 MED ORDER — PROPOFOL 10 MG/ML IV BOLUS
INTRAVENOUS | Status: DC | PRN
  Administered 2022-03-10: 20 mg via INTRAVENOUS
  Administered 2022-03-10: 25 mg via INTRAVENOUS

## 2022-03-10 MED ORDER — LIDOCAINE HCL (CARDIAC) PF 100 MG/5ML IV SOSY
PREFILLED_SYRINGE | INTRAVENOUS | Status: DC | PRN
  Administered 2022-03-10: 40 mg via INTRAVENOUS

## 2022-03-10 MED ORDER — PROPOFOL 10 MG/ML IV BOLUS
INTRAVENOUS | Status: AC
  Filled 2022-03-10: qty 20

## 2022-03-10 MED ORDER — LACTATED RINGERS IV BOLUS
500.0000 mL | Freq: Once | INTRAVENOUS | Status: AC
  Administered 2022-03-10: 500 mL via INTRAVENOUS

## 2022-03-10 MED ORDER — HYDROCODONE-ACETAMINOPHEN 5-325 MG PO TABS
1.0000 | ORAL_TABLET | ORAL | Status: DC | PRN
  Administered 2022-03-10: 1 via ORAL

## 2022-03-10 MED ORDER — ORAL CARE MOUTH RINSE: 15.0000 mL | Freq: Once | OROMUCOSAL | Status: AC

## 2022-03-10 MED ORDER — OXYCODONE HCL 5 MG PO TABS: 5.0000 mg | ORAL_TABLET | Freq: Once | ORAL | Status: AC | PRN

## 2022-03-10 MED ORDER — FENTANYL CITRATE (PF) 250 MCG/5ML IJ SOLN
INTRAMUSCULAR | Status: AC
  Filled 2022-03-10: qty 5

## 2022-03-10 MED ORDER — ONDANSETRON HCL 4 MG/2ML IJ SOLN: 4.0000 mg | Freq: Four times a day (QID) | INTRAMUSCULAR | Status: DC | PRN

## 2022-03-10 MED ORDER — PROPOFOL 500 MG/50ML IV EMUL
INTRAVENOUS | Status: DC | PRN
  Administered 2022-03-10: 100 ug/kg/min via INTRAVENOUS

## 2022-03-10 MED ORDER — ACETAMINOPHEN 500 MG PO TABS
ORAL_TABLET | ORAL | Status: AC
  Filled 2022-03-10: qty 1

## 2022-03-10 MED ORDER — BUPIVACAINE-EPINEPHRINE (PF) 0.5% -1:200000 IJ SOLN
INTRAMUSCULAR | Status: AC
  Filled 2022-03-10: qty 30

## 2022-03-10 MED ORDER — CELECOXIB 200 MG PO CAPS
200.0000 mg | ORAL_CAPSULE | Freq: Two times a day (BID) | ORAL | Status: DC
  Administered 2022-03-10: 200 mg via ORAL
  Filled 2022-03-10: qty 1

## 2022-03-10 MED ORDER — METHOCARBAMOL 500 MG PO TABS: 500.0000 mg | ORAL_TABLET | Freq: Four times a day (QID) | ORAL | Status: DC | PRN

## 2022-03-10 MED ORDER — ONDANSETRON HCL 4 MG PO TABS: 4.0000 mg | ORAL_TABLET | Freq: Three times a day (TID) | ORAL | 0 refills | Status: DC | PRN

## 2022-03-10 SURGICAL SUPPLY — 53 items
BAG COUNTER SPONGE SURGICOUNT (BAG) IMPLANT
BAG DECANTER FOR FLEXI CONT (MISCELLANEOUS) IMPLANT
BAG ZIPLOCK 12X15 (MISCELLANEOUS) IMPLANT
CHLORAPREP W/TINT 26 (MISCELLANEOUS) ×1 IMPLANT
COVER PERINEAL POST (MISCELLANEOUS) ×1 IMPLANT
COVER SURGICAL LIGHT HANDLE (MISCELLANEOUS) ×1 IMPLANT
DERMABOND ADVANCED .7 DNX12 (GAUZE/BANDAGES/DRESSINGS) ×2 IMPLANT
DRAPE IMP U-DRAPE 54X76 (DRAPES) ×1 IMPLANT
DRAPE SHEET LG 3/4 BI-LAMINATE (DRAPES) ×3 IMPLANT
DRAPE STERI IOBAN 125X83 (DRAPES) ×1 IMPLANT
DRAPE U-SHAPE 47X51 STRL (DRAPES) ×2 IMPLANT
DRSG AQUACEL AG ADV 3.5X10 (GAUZE/BANDAGES/DRESSINGS) ×1 IMPLANT
ELECT REM PT RETURN 15FT ADLT (MISCELLANEOUS) ×1 IMPLANT
G7 VIT E NTRL LNR 36 SZG (Miscellaneous) IMPLANT
GAUZE SPONGE 4X4 12PLY STRL (GAUZE/BANDAGES/DRESSINGS) ×1 IMPLANT
GLOVE BIO SURGEON STRL SZ8.5 (GLOVE) ×2 IMPLANT
GLOVE BIOGEL M 7.0 STRL (GLOVE) ×1 IMPLANT
GLOVE BIOGEL PI IND STRL 7.5 (GLOVE) ×1 IMPLANT
GLOVE BIOGEL PI IND STRL 8.5 (GLOVE) ×1 IMPLANT
GOWN SPEC L3 XXLG W/TWL (GOWN DISPOSABLE) ×1 IMPLANT
GOWN STRL REUS W/ TWL XL LVL3 (GOWN DISPOSABLE) ×1 IMPLANT
GOWN STRL REUS W/TWL XL LVL3 (GOWN DISPOSABLE) ×1
HANDPIECE INTERPULSE COAX TIP (DISPOSABLE) ×1
HD FEM MOD 36M STD (Miscellaneous) ×1 IMPLANT
HEAD FEM MOD 36M STD (Miscellaneous) IMPLANT
HOLDER FOLEY CATH W/STRAP (MISCELLANEOUS) ×1 IMPLANT
HOOD PEEL AWAY T7 (MISCELLANEOUS) ×3 IMPLANT
KIT TURNOVER KIT A (KITS) IMPLANT
MANIFOLD NEPTUNE II (INSTRUMENTS) ×1 IMPLANT
MARKER SKIN DUAL TIP RULER LAB (MISCELLANEOUS) ×1 IMPLANT
NDL SAFETY ECLIP 18X1.5 (MISCELLANEOUS) ×1 IMPLANT
NDL SPNL 18GX3.5 QUINCKE PK (NEEDLE) ×1 IMPLANT
NEEDLE SPNL 18GX3.5 QUINCKE PK (NEEDLE) ×1 IMPLANT
PACK ANTERIOR HIP CUSTOM (KITS) ×1 IMPLANT
PENCIL SMOKE EVACUATOR (MISCELLANEOUS) IMPLANT
SAW OSC TIP CART 19.5X105X1.3 (SAW) ×1 IMPLANT
SEALER BIPOLAR AQUA 6.0 (INSTRUMENTS) ×1 IMPLANT
SET HNDPC FAN SPRY TIP SCT (DISPOSABLE) ×1 IMPLANT
SHELL ACET G7 4H 60 SZG (Shell) IMPLANT
SOLUTION PRONTOSAN WOUND 350ML (IRRIGATION / IRRIGATOR) ×1 IMPLANT
SPIKE FLUID TRANSFER (MISCELLANEOUS) ×1 IMPLANT
STEM FEM CMTLS HO 24X133 (Stem) IMPLANT
SUT MNCRL AB 3-0 PS2 18 (SUTURE) ×1 IMPLANT
SUT MON AB 2-0 CT1 36 (SUTURE) ×1 IMPLANT
SUT STRATAFIX PDO 1 14 VIOLET (SUTURE) ×1
SUT STRATFX PDO 1 14 VIOLET (SUTURE) ×1
SUT VIC AB 2-0 CT1 27 (SUTURE)
SUT VIC AB 2-0 CT1 TAPERPNT 27 (SUTURE) IMPLANT
SUTURE STRATFX PDO 1 14 VIOLET (SUTURE) ×1 IMPLANT
SYR 3ML LL SCALE MARK (SYRINGE) ×1 IMPLANT
TRAY FOLEY MTR SLVR 16FR STAT (SET/KITS/TRAYS/PACK) IMPLANT
TUBE SUCTION HIGH CAP CLEAR NV (SUCTIONS) ×1 IMPLANT
WATER STERILE IRR 1000ML POUR (IV SOLUTION) ×1 IMPLANT

## 2022-03-10 NOTE — Anesthesia Procedure Notes (Signed)
Procedure Name: MAC Date/Time: 03/10/2022 7:32 AM  Performed by: Colin Benton, CRNAPre-anesthesia Checklist: Patient identified, Emergency Drugs available, Suction available and Patient being monitored Patient Re-evaluated:Patient Re-evaluated prior to induction Oxygen Delivery Method: Simple face mask Induction Type: IV induction Placement Confirmation: positive ETCO2 Dental Injury: Teeth and Oropharynx as per pre-operative assessment

## 2022-03-10 NOTE — Op Note (Signed)
OPERATIVE REPORT  SURGEON: Rod Can, MD   ASSISTANT: Larene Pickett, PA-C.  PREOPERATIVE DIAGNOSIS: Right hip arthritis.   POSTOPERATIVE DIAGNOSIS: Right hip arthritis.   PROCEDURE: Right total hip arthroplasty, anterior approach.   IMPLANTS: Biomet Taperloc Complete Microplasty stem, size 24 x 132 mm, high offset. Biomet G7 OsseoTi Cup, size 60 mm. Biomet Vivacit-E liner, size 36 mm, G, neutral. Biomet metal head ball, size 36 + 0 mm.  ANESTHESIA:  MAC and Spinal  ESTIMATED BLOOD LOSS:-300 mL    ANTIBIOTICS: 2g Ancef.  DRAINS: None.  COMPLICATIONS: None.   CONDITION: PACU - hemodynamically stable.   BRIEF CLINICAL NOTE: Alex Weiss is a 81 y.o. male with a long-standing history of Right hip arthritis. After failing conservative management, the patient was indicated for total hip arthroplasty. The risks, benefits, and alternatives to the procedure were explained, and the patient elected to proceed.  PROCEDURE IN DETAIL: Surgical site was marked by myself in the pre-op holding area. Once inside the operating room, spinal anesthesia was obtained, and a foley catheter was inserted. The patient was then positioned on the Hana table.  All bony prominences were well padded.  The hip was prepped and draped in the normal sterile surgical fashion.  A time-out was called verifying side and site of surgery. The patient received IV antibiotics within 60 minutes of beginning the procedure.   Bikini incision was made, and superficial dissection was performed lateral to the ASIS. The direct anterior approach to the hip was performed through the Hueter interval.  Lateral femoral circumflex vessels were treated with the Auqumantys. The anterior capsule was exposed and an inverted T capsulotomy was made. The femoral neck cut was made to the level of the templated cut.  A corkscrew was placed into the head and the head was removed.  The femoral head was found to have eburnated bone. The  head was passed to the back table and was measured. Pubofemoral ligament was released off of the calcar, taking care to stay on bone. Superior capsule was released from the greater trochanter, taking care to stay lateral to the posterior border of the femoral neck in order to preserve the short external rotators.   Acetabular exposure was achieved, and the pulvinar and labrum were excised. Sequential reaming of the acetabulum was then performed up to a size 59 mm reamer. A 60 mm cup was then opened and impacted into place at approximately 40 degrees of abduction and 20 degrees of anteversion. The final polyethylene liner was impacted into place and acetabular osteophytes were removed.    I then gained femoral exposure taking care to protect the abductors and greater trochanter.  This was performed using standard external rotation, extension, and adduction.  A cookie cutter was used to enter the femoral canal, and then the femoral canal finder was placed.  Sequential broaching was performed up to a size 24.  Calcar planer was used on the femoral neck remnant.  I placed a high offset neck and a trial head ball.  The hip was reduced.  Leg lengths and offset were checked fluoroscopically.  The hip was dislocated and trial components were removed.  The final implants were placed, and the hip was reduced.  Fluoroscopy was used to confirm component position and leg lengths.  At 90 degrees of external rotation and full extension, the hip was stable to an anterior directed force.   The wound was copiously irrigated with Prontosan solution and normal saline using pule lavage.  Marcaine  solution was injected into the periarticular soft tissue.  The wound was closed in layers using #1 Vicryl and V-Loc for the fascia, 2-0 Vicryl for the subcutaneous fat, 2-0 Monocryl for the deep dermal layer, 3-0 running Monocryl subcuticular stitch, and Dermabond for the skin.  Once the glue was fully dried, an Aquacell Ag dressing was  applied.  The patient was transported to the recovery room in stable condition.  Sponge, needle, and instrument counts were correct at the end of the case x2.  The patient tolerated the procedure well and there were no known complications.  Please note that a surgical assistant was a medical necessity for this procedure to perform it in a safe and expeditious manner. Assistant was necessary to provide appropriate retraction of vital neurovascular structures, to prevent femoral fracture, and to allow for anatomic placement of the prosthesis.

## 2022-03-10 NOTE — Interval H&P Note (Signed)
History and Physical Interval Note:  03/10/2022 6:57 AM  Alex Weiss  has presented today for surgery, with the diagnosis of Right hip osteoarthritis.  The various methods of treatment have been discussed with the patient and family. After consideration of risks, benefits and other options for treatment, the patient has consented to  Procedure(s) with comments: [place] (Right) - 150 as a surgical intervention.  The patient's history has been reviewed, patient examined, no change in status, stable for surgery.  I have reviewed the patient's chart and labs.  Questions were answered to the patient's satisfaction.     Hilton Cork Hillis Mcphatter

## 2022-03-10 NOTE — Care Plan (Signed)
Ortho Bundle Case Management Note  Patient Details  Name: OSMANY AZER MRN: [id number] Date of Birth: [birth year]                  R THA on 03-10-22  DCP: Home with wife DME: No needs, has a RW  PT: HEP   DME Arranged:  N/A DME Agency:       Additional Comments: Please contact me with any questions of if this plan should need to change.  Marianne Sofia, RN,CCM [facility]  [phone] 03/10/2022, 3:28 PM

## 2022-03-10 NOTE — Anesthesia Postprocedure Evaluation (Addendum)
Anesthesia Post Note  Patient: Alex Weiss  Procedure(s) Performed: TOTAL HIP ARTHROPLASTY ANTERIOR APPROACH (Right: Hip)     Patient location during evaluation: PACU Anesthesia Type: Spinal Level of consciousness: awake and alert Pain management: pain level controlled Vital Signs Assessment: post-procedure vital signs reviewed and stable Respiratory status: spontaneous breathing Cardiovascular status: stable Anesthetic complications: no   No notable events documented.  Last Vitals:  Vitals:   03/10/22 1045 03/10/22 1105  BP: (!) 157/90   Pulse: 64 81  Resp: 20 20  Temp:  36.4 C  SpO2: 95%     Last Pain:  Vitals:   03/10/22 1105  TempSrc: Oral  PainSc: 0-No pain                 Nolon Nations

## 2022-03-10 NOTE — Discharge Instructions (Signed)
? ?Dr. Brian Swinteck ?Joint Replacement Specialist ?[facility] ?[address] ?[place],  [address] ?[phone] ? ? ?TOTAL HIP REPLACEMENT POSTOPERATIVE DIRECTIONS ? ? ? ?Hip Rehabilitation, Guidelines Following Surgery  ? ?WEIGHT BEARING ?Weight bearing as tolerated with assist device (walker, cane, etc) as directed, use it as long as suggested by your surgeon or therapist, typically at least 4-6 weeks. ? ?The results of a hip operation are greatly improved after range of motion and muscle strengthening exercises. Follow all safety measures which are given to protect your hip. If any of these exercises cause increased pain or swelling in your joint, decrease the amount until you are comfortable again. Then slowly increase the exercises. Call your caregiver if you have problems or questions.  ? ?HOME CARE INSTRUCTIONS  ?Most of the following instructions are designed to prevent the dislocation of your new hip.  ?Remove items at home which could result in a fall. This includes throw rugs or furniture in walking pathways.  ?Continue medications as instructed at time of discharge. ?You may have some home medications which will be placed on hold until you complete the course of blood thinner medication. ?You may start showering once you are discharged home. Do not remove your dressing. ?Do not put on socks or shoes without following the instructions of your caregivers.   ?Sit on chairs with arms. Use the chair arms to help push yourself up when arising.  ?Arrange for the use of a toilet seat elevator so you are not sitting low.  ?Walk with walker as instructed.  ?You may resume a sexual relationship in one month or when given the OK by your caregiver.  ?Use walker as long as suggested by your caregivers.  ?You may put full weight on your legs and walk as much as is comfortable. ?Avoid periods of inactivity such as sitting longer than an hour when not asleep. This helps prevent blood  clots.  ?You may return to work once you are cleared by your surgeon.  ?Do not drive a car for 6 weeks or until released by your surgeon.  ?Do not drive while taking narcotics.  ?Wear elastic stockings for two weeks following surgery during the day but you may remove then at night.  ?Make sure you keep all of your appointments after your operation with all of your doctors and caregivers. You should call the office at the above phone number and make an appointment for approximately two weeks after the date of your surgery. ?Please pick up a stool softener and laxative for home use as long as you are requiring pain medications. ?ICE to the affected hip every three hours for 30 minutes at a time and then as needed for pain and swelling. Continue to use ice on the hip for pain and swelling from surgery. You may notice swelling that will progress down to the foot and ankle.  This is normal after surgery.  Elevate the leg when you are not up walking on it.   ?It is important for you to complete the blood thinner medication as prescribed by your doctor. ?Continue to use the breathing machine which will help keep your temperature down.  It is common for your temperature to cycle up and down following surgery, especially at night when you are not up moving around and exerting yourself.  The breathing machine keeps your lungs expanded and your temperature down. ? ?RANGE OF MOTION AND STRENGTHENING EXERCISES  ?These exercises are designed to help you   keep full movement of your hip joint. Follow your caregiver's or physical therapist's instructions. Perform all exercises about fifteen times, three times per day or as directed. Exercise both hips, even if you have had only one joint replacement. These exercises can be done on a training (exercise) mat, on the floor, on a table or on a bed. Use whatever works the best and is most comfortable for you. Use music or television while you are exercising so that the exercises are a  pleasant break in your day. This will make your life better with the exercises acting as a break in routine you can look forward to.  ?Lying on your back, slowly slide your foot toward your buttocks, raising your knee up off the floor. Then slowly slide your foot back down until your leg is straight again.  ?Lying on your back spread your legs as far apart as you can without causing discomfort.  ?Lying on your side, raise your upper leg and foot straight up from the floor as far as is comfortable. Slowly lower the leg and repeat.  ?Lying on your back, tighten up the muscle in the front of your thigh (quadriceps muscles). You can do this by keeping your leg straight and trying to raise your heel off the floor. This helps strengthen the largest muscle supporting your knee.  ?Lying on your back, tighten up the muscles of your buttocks both with the legs straight and with the knee bent at a comfortable angle while keeping your heel on the floor.  ? ?SKILLED REHAB INSTRUCTIONS: ?If the patient is transferred to a skilled rehab facility following release from the hospital, a list of the current medications will be sent to the facility for the patient to continue.  When discharged from the skilled rehab facility, please have the facility set up the patient's Home Health Physical Therapy prior to being released. Also, the skilled facility will be responsible for providing the patient with their medications at time of release from the facility to include their pain medication and their blood thinner medication. If the patient is still at the rehab facility at time of the two week follow up appointment, the skilled rehab facility will also need to assist the patient in arranging follow up appointment in our office and any transportation needs. ? ?POST-OPERATIVE OPIOID TAPER INSTRUCTIONS: ?It is important to wean off of your opioid medication as soon as possible. If you do not need pain medication after your surgery it is ok  to stop day one. ?Opioids include: ?Codeine, Hydrocodone(Norco, Vicodin), Oxycodone(Percocet, oxycontin) and hydromorphone amongst others.  ?Long term and even short term use of opiods can cause: ?Increased pain response ?Dependence ?Constipation ?Depression ?Respiratory depression ?And more.  ?Withdrawal symptoms can include ?Flu like symptoms ?Nausea, vomiting ?And more ?Techniques to manage these symptoms ?Hydrate well ?Eat regular healthy meals ?Stay active ?Use relaxation techniques(deep breathing, meditating, yoga) ?Do Not substitute Alcohol to help with tapering ?If you have been on opioids for less than two weeks and do not have pain than it is ok to stop all together.  ?Plan to wean off of opioids ?This plan should start within one week post op of your joint replacement. ?Maintain the same interval or time between taking each dose and first decrease the dose.  ?Cut the total daily intake of opioids by one tablet each day ?Next start to increase the time between doses. ?The last dose that should be eliminated is the evening dose.  ? ? ?MAKE   SURE YOU:  ?Understand these instructions.  ?Will watch your condition.  ?Will get help right away if you are not doing well or get worse. ? ?Pick up stool softner and laxative for home use following surgery while on pain medications. ?Do not remove your dressing. ?The dressing is waterproof--it is OK to take showers. ?Continue to use ice for pain and swelling after surgery. ?Do not use any lotions or creams on the incision until instructed by your surgeon. ?Total Hip Protocol. ? ?

## 2022-03-10 NOTE — Progress Notes (Signed)
Physical Therapy Treatment Patient Details Name: Alex Weiss MRN: [id number] DOB: 06-13-1940 Today's Date: 03/10/2022   History of Present Illness Pt s/p R THR and with hx of back surgery and cervical decompression    PT Comments    Pt feeling better with improved sensation and assisted up to mobilize including ambulation in hall and negotiated stairs.  Pt reviewed written HEP and car transfers.  Family present and pt eager for dc home this date.  Recommendations for follow up therapy are one component of a multi-disciplinary discharge planning process, led by the attending physician.  Recommendations may be updated based on patient status, additional functional criteria and insurance authorization.  Follow Up Recommendations  Follow physician's recommendations for discharge plan and follow up therapies     Assistance Recommended at Discharge Frequent or constant Supervision/Assistance  Patient can return home with the following A lot of help with walking and/or transfers;A little help with bathing/dressing/bathroom;Assist for transportation;Help with stairs or ramp for entrance;Assistance with cooking/housework   Equipment Recommendations  None recommended by PT    Recommendations for Other Services       Precautions / Restrictions Precautions Precautions: Fall Restrictions Weight Bearing Restrictions: No Other Position/Activity Restrictions: WBAT     Mobility  Bed Mobility Overal bed mobility: Needs Assistance Bed Mobility: Supine to Sit     Supine to sit: Min assist     General bed mobility comments: cues for sequence and use of L LE to self assist    Transfers Overall transfer level: Needs assistance Equipment used: Rolling walker (2 wheels) Transfers: Sit to/from Stand Sit to Stand: Min assist           General transfer comment: cues for LE management and use of UEs to self assist    Ambulation/Gait Ambulation/Gait assistance: Min assist, Min  guard Gait Distance (Feet): 100 Feet Assistive device: Rolling walker (2 wheels) Gait Pattern/deviations: Step-to pattern, Decreased step length - right, Decreased step length - left, Shuffle, Trunk flexed Gait velocity: decr     General Gait Details: cues for sequence, posture and position from RW   Stairs Stairs: Yes Stairs assistance: Min assist Stair Management: No rails, Sideways, Forwards, With walker Number of Stairs: 4 General stair comments: 2 steps sideways using doorframe; 2 steps fwd with door frame on R and RW on L   Wheelchair Mobility    Modified Rankin (Stroke Patients Only)       Balance Overall balance assessment: Needs assistance Sitting-balance support: No upper extremity supported, Feet supported Sitting balance-Leahy Scale: Good     Standing balance support: Single extremity supported Standing balance-Leahy Scale: Poor                              Cognition Arousal/Alertness: Awake/alert Behavior During Therapy: WFL for tasks assessed/performed Overall Cognitive Status: Within Functional Limits for tasks assessed                                          Exercises Total Joint Exercises Ankle Circles/Pumps: AROM, Both, 15 reps, Supine Quad Sets: AROM, Both, 10 reps, Supine Heel Slides: AAROM, Right, 15 reps, Supine Hip ABduction/ADduction: AAROM, Right, 15 reps, Supine Long Arc Quad: AAROM, Right, 10 reps, Seated    General Comments        Pertinent Vitals/Pain Pain Assessment Pain Assessment: 0-10 Pain  Score: 3  Pain Location: R hip Pain Descriptors / Indicators: Aching, Sore Pain Intervention(s): Monitored during session, Limited activity within patient's tolerance, Premedicated before session    [place] expects to be discharged to:: Private residence Living Arrangements: Spouse/significant other Available Help at Discharge: Family;Available 24 hours/day Type of Home: House Home  Access: Stairs to enter Entrance Stairs-Rails: None Entrance Stairs-Number of Steps: 2   Home Layout: One level Home Equipment: Conservation officer, nature (2 wheels);Cane - single point      Prior Function            PT Goals (current goals can now be found in the care plan section) Acute Rehab PT Goals Patient Stated Goal: Regain IND PT Goal Formulation: With patient Time For Goal Achievement: 03/17/22 Potential to Achieve Goals: Good Progress towards PT goals: Progressing toward goals    Frequency    7X/week      PT Plan Current plan remains appropriate    Co-evaluation              AM-PAC PT "6 Clicks" Mobility   Outcome Measure  Help needed turning from your back to your side while in a flat bed without using bedrails?: A Little Help needed moving from lying on your back to sitting on the side of a flat bed without using bedrails?: A Little Help needed moving to and from a bed to a chair (including a wheelchair)?: A Little Help needed standing up from a chair using your arms (e.g., wheelchair or bedside chair)?: A Little Help needed to walk in hospital room?: A Little Help needed climbing 3-5 steps with a railing? : A Little 6 Click Score: 18    End of Session Equipment Utilized During Treatment: Gait belt Activity Tolerance: Patient tolerated treatment well Patient left: in chair;with call bell/phone within reach;with family/visitor present Nurse Communication: Mobility status PT Visit Diagnosis: Unsteadiness on feet (R26.81);Difficulty in walking, not elsewhere classified (R26.2)     Time: 4235-3614 PT Time Calculation (min) (ACUTE ONLY): 37 min  Charges:  $Gait Training: 8-22 mins $Therapeutic Exercise: 8-22 mins $Therapeutic Activity: 8-22 mins                     Debe Coder PT Acute Rehabilitation Services Pager [phone] Office [phone]    Cheralyn Oliver 03/10/2022, 2:25 PM

## 2022-03-10 NOTE — Evaluation (Signed)
Physical Therapy Evaluation Patient Details Name: Alex Weiss MRN: [id number] DOB: 03-12-1941 Today's Date: 03/10/2022  History of Present Illness  Pt s/p R THR and with hx of back surgery and cervical decompression  Clinical Impression  Pt s/p R THR and presents with decreased R LE strength/ROM and post op pain limiting functional mobility.  This session pt performed HEP with written instruction provided and reviewed and including exercise progression.  OOB deferred pending return of full sensation post op.     Recommendations for follow up therapy are one component of a multi-disciplinary discharge planning process, led by the attending physician.  Recommendations may be updated based on patient status, additional functional criteria and insurance authorization.  Follow Up Recommendations Follow physician's recommendations for discharge plan and follow up therapies      Assistance Recommended at Discharge Frequent or constant Supervision/Assistance  Patient can return home with the following  A lot of help with walking and/or transfers;A little help with bathing/dressing/bathroom;Assist for transportation;Help with stairs or ramp for entrance;Assistance with cooking/housework    Equipment Recommendations None recommended by PT  Recommendations for Other Services       Functional Status Assessment Patient has had a recent decline in their functional status and demonstrates the ability to make significant improvements in function in a reasonable and predictable amount of time.     Precautions / Restrictions Precautions Precautions: Fall Restrictions Weight Bearing Restrictions: No Other Position/Activity Restrictions: WBAT      Mobility  Bed Mobility               General bed mobility comments: OOB deferred pending return of full sensation post op    Transfers                        Ambulation/Gait                  Stairs             Wheelchair Mobility    Modified Rankin (Stroke Patients Only)       Balance                                             Pertinent Vitals/Pain Pain Assessment Pain Assessment: 0-10 Pain Score: 2  Pain Location: R hip Pain Descriptors / Indicators: Aching, Sore Pain Intervention(s): Limited activity within patient's tolerance, Monitored during session, Premedicated before session    Home Living Family/patient expects to be discharged to:: Private residence Living Arrangements: Spouse/significant other Available Help at Discharge: Family;Available 24 hours/day Type of Home: House Home Access: Stairs to enter Entrance Stairs-Rails: None Entrance Stairs-Number of Steps: 2   Home Layout: One level Home Equipment: Conservation officer, nature (2 wheels);Cane - single point      Prior Function Prior Level of Function : Independent/Modified Independent                     Hand Dominance        Extremity/Trunk Assessment   Upper Extremity Assessment Upper Extremity Assessment: Overall WFL for tasks assessed    Lower Extremity Assessment Lower Extremity Assessment: RLE deficits/detail RLE Deficits / Details: 2+/5 strength at hip with AAROM at hip to 85 flex and 15 abd    Cervical / Trunk Assessment Cervical / Trunk Assessment: Normal  Communication   Communication: No difficulties  Cognition Arousal/Alertness: Awake/alert Behavior During Therapy: WFL for tasks assessed/performed Overall Cognitive Status: Within Functional Limits for tasks assessed                                          General Comments      Exercises Total Joint Exercises Ankle Circles/Pumps: AROM, Both, 15 reps, Supine Quad Sets: AROM, Both, 10 reps, Supine Heel Slides: AAROM, Right, 15 reps, Supine Hip ABduction/ADduction: AAROM, Right, 15 reps, Supine   Assessment/Plan    PT Assessment Patient needs continued PT services  PT Problem List Decreased  strength;Decreased range of motion;Decreased activity tolerance;Decreased balance;Decreased knowledge of use of DME;Pain       PT Treatment Interventions DME instruction;Gait training;Stair training;Functional mobility training;Therapeutic activities;Therapeutic exercise;Patient/family education    PT Goals (Current goals can be found in the Care Plan section)  Acute Rehab PT Goals Patient Stated Goal: Regain IND PT Goal Formulation: With patient Time For Goal Achievement: 03/17/22 Potential to Achieve Goals: Good    Frequency 7X/week     Co-evaluation               AM-PAC PT "6 Clicks" Mobility  Outcome Measure Help needed turning from your back to your side while in a flat bed without using bedrails?: A Lot Help needed moving from lying on your back to sitting on the side of a flat bed without using bedrails?: A Lot Help needed moving to and from a bed to a chair (including a wheelchair)?: A Lot Help needed standing up from a chair using your arms (e.g., wheelchair or bedside chair)?: A Lot Help needed to walk in hospital room?: A Lot Help needed climbing 3-5 steps with a railing? : A Lot 6 Click Score: 12    End of Session Equipment Utilized During Treatment: Gait belt Activity Tolerance: Patient tolerated treatment well Patient left: in bed;with call bell/phone within reach;with family/visitor present Nurse Communication: Mobility status PT Visit Diagnosis: Unsteadiness on feet (R26.81);Difficulty in walking, not elsewhere classified (R26.2)    Time: 9604-5409 PT Time Calculation (min) (ACUTE ONLY): 30 min   Charges:   PT Evaluation $PT Eval Low Complexity: 1 Low PT Treatments $Therapeutic Exercise: 8-22 mins        Debe Coder PT Acute Rehabilitation Services Pager [phone] Office [phone]   Bethsaida Siegenthaler 03/10/2022, 2:15 PM

## 2022-03-10 NOTE — Anesthesia Procedure Notes (Signed)
Spinal  Patient location during procedure: OR Start time: 03/10/2022 7:25 AM End time: 03/10/2022 7:30 AM Reason for block: surgical anesthesia Staffing Performed: anesthesiologist  Anesthesiologist: Nolon Nations, MD Performed by: Nolon Nations, MD Authorized by: Nolon Nations, MD   Preanesthetic Checklist Completed: patient identified, IV checked, site marked, risks and benefits discussed, surgical consent, monitors and equipment checked, pre-op evaluation and timeout performed Spinal Block Patient position: sitting Prep: DuraPrep and site prepped and draped Patient monitoring: heart rate, continuous pulse ox and blood pressure Approach: right paramedian Injection technique: single-shot Needle Needle type: Spinocan  Needle gauge: 25 G Needle length: 9 cm Additional Notes Expiration date of kit checked and confirmed. Patient tolerated procedure well, without complications.

## 2022-03-10 NOTE — Transfer of Care (Signed)
Immediate Anesthesia Transfer of Care Note  Patient: Alex Weiss  Procedure(s) Performed: TOTAL HIP ARTHROPLASTY ANTERIOR APPROACH (Right: Hip)  Patient Location: PACU  Anesthesia Type:Spinal  Level of Consciousness: drowsy  Airway & Oxygen Therapy: Patient Spontanous Breathing and Patient connected to face mask oxygen  Post-op Assessment: Report given to RN and Post -op Vital signs reviewed and stable  Post vital signs: Reviewed and stable  Last Vitals:  Vitals Value Taken Time  BP 119/71 03/10/22 0919  Temp    Pulse 85 03/10/22 0921  Resp 22 03/10/22 0921  SpO2 94 % 03/10/22 0921  Vitals shown include unvalidated device data.  Last Pain:  Vitals:   03/10/22 0623  PainSc: 0-No pain         Complications: No notable events documented.

## 2022-03-11 ENCOUNTER — Encounter (HOSPITAL_COMMUNITY): Payer: Self-pay | Admitting: Orthopedic Surgery

## 2022-03-15 ENCOUNTER — Encounter: Payer: Self-pay | Admitting: Internal Medicine

## 2022-03-15 NOTE — Telephone Encounter (Signed)
error 

## 2022-03-21 ENCOUNTER — Other Ambulatory Visit: Payer: Medicare Other

## 2022-03-22 DIAGNOSIS — Z96641 Presence of right artificial hip joint: Secondary | ICD-10-CM | POA: Diagnosis not present

## 2022-03-22 DIAGNOSIS — Z471 Aftercare following joint replacement surgery: Secondary | ICD-10-CM | POA: Diagnosis not present

## 2022-04-19 DIAGNOSIS — Z471 Aftercare following joint replacement surgery: Secondary | ICD-10-CM | POA: Diagnosis not present

## 2022-04-19 DIAGNOSIS — Z96641 Presence of right artificial hip joint: Secondary | ICD-10-CM | POA: Diagnosis not present

## 2022-08-02 DIAGNOSIS — Z96649 Presence of unspecified artificial hip joint: Secondary | ICD-10-CM | POA: Diagnosis not present

## 2022-08-02 DIAGNOSIS — Z96641 Presence of right artificial hip joint: Secondary | ICD-10-CM | POA: Diagnosis not present

## 2022-10-25 ENCOUNTER — Encounter: Payer: Self-pay | Admitting: Family Medicine

## 2022-10-25 ENCOUNTER — Ambulatory Visit (INDEPENDENT_AMBULATORY_CARE_PROVIDER_SITE_OTHER): Payer: Medicare Other | Admitting: Family Medicine

## 2022-10-25 VITALS — BP 120/82 | HR 58 | Temp 98.0°F | Ht 72.0 in | Wt 213.0 lb

## 2022-10-25 DIAGNOSIS — N138 Other obstructive and reflux uropathy: Secondary | ICD-10-CM

## 2022-10-25 DIAGNOSIS — M159 Polyosteoarthritis, unspecified: Secondary | ICD-10-CM | POA: Diagnosis not present

## 2022-10-25 DIAGNOSIS — R739 Hyperglycemia, unspecified: Secondary | ICD-10-CM

## 2022-10-25 DIAGNOSIS — E782 Mixed hyperlipidemia: Secondary | ICD-10-CM

## 2022-10-25 DIAGNOSIS — I10 Essential (primary) hypertension: Secondary | ICD-10-CM | POA: Diagnosis not present

## 2022-10-25 DIAGNOSIS — G4733 Obstructive sleep apnea (adult) (pediatric): Secondary | ICD-10-CM | POA: Diagnosis not present

## 2022-10-25 DIAGNOSIS — N401 Enlarged prostate with lower urinary tract symptoms: Secondary | ICD-10-CM | POA: Diagnosis not present

## 2022-10-25 DIAGNOSIS — R17 Unspecified jaundice: Secondary | ICD-10-CM

## 2022-10-25 LAB — LIPID PANEL
Cholesterol: 197 mg/dL (ref 0–200)
HDL: 48.8 mg/dL (ref >39.00)
LDL Cholesterol: 126 mg/dL — ABNORMAL HIGH (ref 0–99)
NonHDL: 147.71
Total CHOL/HDL Ratio: 4
Triglycerides: 109 mg/dL (ref 0.0–149.0)
VLDL: 21.8 mg/dL (ref 0.0–40.0)

## 2022-10-25 LAB — BASIC METABOLIC PANEL
BUN: 24 mg/dL — ABNORMAL HIGH (ref 6–23)
CO2: 30 mEq/L (ref 19–32)
Calcium: 10.4 mg/dL (ref 8.4–10.5)
Chloride: 102 mEq/L (ref 96–112)
Creatinine, Ser: 0.98 mg/dL (ref 0.40–1.50)
GFR: 71.97 mL/min (ref >60.00)
Glucose, Bld: 112 mg/dL — ABNORMAL HIGH (ref 70–99)
Potassium: 4.6 mEq/L (ref 3.5–5.1)
Sodium: 140 mEq/L (ref 135–145)

## 2022-10-25 LAB — CBC WITH DIFFERENTIAL/PLATELET
Basophils Absolute: 0 10*3/uL (ref 0.0–0.1)
Basophils Relative: 0.4 % (ref 0.0–3.0)
Eosinophils Absolute: 0.2 10*3/uL (ref 0.0–0.7)
Eosinophils Relative: 3.6 % (ref 0.0–5.0)
HCT: 50.6 % (ref 39.0–52.0)
Hemoglobin: 17.2 g/dL — ABNORMAL HIGH (ref 13.0–17.0)
Lymphocytes Relative: 16.5 % (ref 12.0–46.0)
Lymphs Abs: 0.8 10*3/uL (ref 0.7–4.0)
MCHC: 33.9 g/dL (ref 30.0–36.0)
MCV: 94.4 fl (ref 78.0–100.0)
Monocytes Absolute: 0.5 10*3/uL (ref 0.1–1.0)
Monocytes Relative: 9.5 % (ref 3.0–12.0)
Neutro Abs: 3.3 10*3/uL (ref 1.4–7.7)
Neutrophils Relative %: 70 % (ref 43.0–77.0)
Platelets: 137 10*3/uL — ABNORMAL LOW (ref 150.0–400.0)
RBC: 5.36 Mil/uL (ref 4.22–5.81)
RDW: 12.8 % (ref 11.5–15.5)
WBC: 4.8 10*3/uL (ref 4.0–10.5)

## 2022-10-25 LAB — HEPATIC FUNCTION PANEL
ALT: 15 U/L (ref 0–53)
AST: 16 U/L (ref 0–37)
Albumin: 4.7 g/dL (ref 3.5–5.2)
Alkaline Phosphatase: 84 U/L (ref 39–117)
Bilirubin, Direct: 0.2 mg/dL (ref 0.0–0.3)
Total Bilirubin: 1.3 mg/dL — ABNORMAL HIGH (ref 0.2–1.2)
Total Protein: 7.2 g/dL (ref 6.0–8.3)

## 2022-10-25 LAB — TSH: TSH: 1.49 u[IU]/mL (ref 0.35–5.50)

## 2022-10-25 LAB — PSA: PSA: 1.89 ng/mL (ref 0.10–4.00)

## 2022-10-25 LAB — HEMOGLOBIN A1C: Hgb A1c MFr Bld: 5.5 % (ref 4.6–6.5)

## 2022-10-25 MED ORDER — SULINDAC 200 MG PO TABS: 200.0000 mg | ORAL_TABLET | Freq: Every day | ORAL | 3 refills | Status: DC

## 2022-10-25 MED ORDER — LOSARTAN POTASSIUM 25 MG PO TABS: 25.0000 mg | ORAL_TABLET | Freq: Every day | ORAL | 3 refills | Status: DC

## 2022-10-25 MED ORDER — TRIAMCINOLONE ACETONIDE 0.1 % EX CREA: 1.0000 | TOPICAL_CREAM | Freq: Two times a day (BID) | CUTANEOUS | 5 refills | Status: DC | PRN

## 2022-10-25 MED ORDER — METAXALONE 400 MG PO TABS: 800.0000 mg | ORAL_TABLET | Freq: Three times a day (TID) | ORAL | 0 refills | Status: DC | PRN

## 2022-10-25 NOTE — Progress Notes (Signed)
Subjective:    Patient ID: Alex Weiss, male    DOB: Aug 26, 1940, 82 y.o.   MRN: [id number]  HPI Here to follow up on issues. He is doing well. He had a right total hip replacement in December, and this went very well. As part of his preop workup he had an ECHO showing an EF of 56%, mild LVH, and Grade I diastolic dysfunction. He will see Dr. Wilson Singer next month for a urologic exam. His BPH is stable. He averages getting up to urinate twice each night. His BP has been stable.    Review of Systems  Constitutional: Negative.   HENT: Negative.    Eyes: Negative.   Respiratory: Negative.    Cardiovascular: Negative.   Gastrointestinal: Negative.   Genitourinary: Negative.   Musculoskeletal: Negative.   Skin: Negative.   Neurological: Negative.   Psychiatric/Behavioral: Negative.         Objective:   Physical Exam Constitutional:      General: He is not in acute distress.    Appearance: Normal appearance. He is well-developed. He is not diaphoretic.  HENT:     Head: Normocephalic and atraumatic.     Right Ear: External ear normal.     Left Ear: External ear normal.     Nose: Nose normal.     Mouth/Throat:     Pharynx: No oropharyngeal exudate.  Eyes:     General: No scleral icterus.       Right eye: No discharge.        Left eye: No discharge.     Conjunctiva/sclera: Conjunctivae normal.     Pupils: Pupils are equal, round, and reactive to light.  Neck:     Thyroid: No thyromegaly.     Vascular: No JVD.     Trachea: No tracheal deviation.  Cardiovascular:     Rate and Rhythm: Normal rate and regular rhythm.     Heart sounds: Normal heart sounds. No murmur heard.    No friction rub. No gallop.  Pulmonary:     Effort: Pulmonary effort is normal. No respiratory distress.     Breath sounds: Normal breath sounds. No wheezing or rales.  Chest:     Chest wall: No tenderness.  Abdominal:     General: Bowel sounds are normal. There is no distension.     Palpations: Abdomen is  soft. There is no mass.     Tenderness: There is no abdominal tenderness. There is no guarding or rebound.  Genitourinary:    Penis: No tenderness.      Rectum: Guaiac result positive.  Musculoskeletal:        General: No tenderness. Normal range of motion.     Cervical back: Neck supple.  Lymphadenopathy:     Cervical: No cervical adenopathy.  Skin:    General: Skin is warm and dry.     Coloration: Skin is not pale.     Findings: No erythema or rash.  Neurological:     Mental Status: He is alert and oriented to person, place, and time.     Cranial Nerves: No cranial nerve deficit.     Motor: No abnormal muscle tone.     Coordination: Coordination normal.     Deep Tendon Reflexes: Reflexes are normal and symmetric. Reflexes normal.  Psychiatric:        Behavior: Behavior normal.        Thought Content: Thought content normal.        Judgment: Judgment normal.  Assessment & Plan:  His HTN and BPH are stable. He will see Dr. Wilson Singer as above. Get fasting labs to check lipids, anA1c, etc. We spent a total of (33   ) minutes reviewing records and discussing these issues.  Gershon Crane, MD

## 2022-11-01 ENCOUNTER — Encounter: Payer: Self-pay | Admitting: Family Medicine

## 2022-11-01 ENCOUNTER — Ambulatory Visit (INDEPENDENT_AMBULATORY_CARE_PROVIDER_SITE_OTHER): Payer: Medicare Other | Admitting: Family Medicine

## 2022-11-01 VITALS — BP 118/74 | HR 76 | Temp 98.4°F | Wt 215.4 lb

## 2022-11-01 DIAGNOSIS — N6321 Unspecified lump in the left breast, upper outer quadrant: Secondary | ICD-10-CM | POA: Diagnosis not present

## 2022-11-01 NOTE — Progress Notes (Signed)
   Subjective:    Patient ID: Alex Weiss, male    DOB: 07-Nov-1940, 82 y.o.   MRN: [id number]  HPI Here for a tender lump in the left breast he discovered this past weekend while he was in a swimming pool. He has never had this before. He takes some OTC vitamins but no supplements.    Review of Systems  Constitutional: Negative.   Respiratory: Negative.    Cardiovascular: Negative.        Objective:   Physical Exam Constitutional:      Appearance: Normal appearance.  Cardiovascular:     Rate and Rhythm: Normal rate and regular rhythm.     Pulses: Normal pulses.     Heart sounds: Normal heart sounds.  Pulmonary:     Effort: Pulmonary effort is normal.     Breath sounds: Normal breath sounds.     Comments: There is a <1.0 cm tender mobile lump in the left breast at the 2 o'clock position. The left axilla is clear  Neurological:     Mental Status: He is alert.           Assessment & Plan:  Left breat lump. We will set up an mammogram and an [place] to evaluate this.  Gershon Crane, MD

## 2022-11-02 DIAGNOSIS — N200 Calculus of kidney: Secondary | ICD-10-CM | POA: Diagnosis not present

## 2022-11-02 DIAGNOSIS — N401 Enlarged prostate with lower urinary tract symptoms: Secondary | ICD-10-CM | POA: Diagnosis not present

## 2022-11-02 DIAGNOSIS — R350 Frequency of micturition: Secondary | ICD-10-CM | POA: Diagnosis not present

## 2022-11-02 DIAGNOSIS — R3129 Other microscopic hematuria: Secondary | ICD-10-CM | POA: Diagnosis not present

## 2022-11-03 NOTE — Addendum Note (Signed)
Addended by: Johnella Moloney on: 11/03/2022 10:42 AM   Modules accepted: Orders

## 2022-11-15 DIAGNOSIS — L812 Freckles: Secondary | ICD-10-CM | POA: Diagnosis not present

## 2022-11-15 DIAGNOSIS — L821 Other seborrheic keratosis: Secondary | ICD-10-CM | POA: Diagnosis not present

## 2022-11-15 DIAGNOSIS — D225 Melanocytic nevi of trunk: Secondary | ICD-10-CM | POA: Diagnosis not present

## 2022-11-15 DIAGNOSIS — L570 Actinic keratosis: Secondary | ICD-10-CM | POA: Diagnosis not present

## 2022-11-15 DIAGNOSIS — D1801 Hemangioma of skin and subcutaneous tissue: Secondary | ICD-10-CM | POA: Diagnosis not present

## 2022-11-16 DIAGNOSIS — N281 Cyst of kidney, acquired: Secondary | ICD-10-CM | POA: Diagnosis not present

## 2022-11-16 DIAGNOSIS — R3129 Other microscopic hematuria: Secondary | ICD-10-CM | POA: Diagnosis not present

## 2022-11-16 DIAGNOSIS — N200 Calculus of kidney: Secondary | ICD-10-CM | POA: Diagnosis not present

## 2022-11-16 DIAGNOSIS — K5730 Diverticulosis of large intestine without perforation or abscess without bleeding: Secondary | ICD-10-CM | POA: Diagnosis not present

## 2022-11-16 DIAGNOSIS — K7689 Other specified diseases of liver: Secondary | ICD-10-CM | POA: Diagnosis not present

## 2022-11-21 ENCOUNTER — Ambulatory Visit: Admission: RE | Admit: 2022-11-21 | Payer: Medicare Other | Source: Ambulatory Visit

## 2022-11-21 DIAGNOSIS — N6321 Unspecified lump in the left breast, upper outer quadrant: Secondary | ICD-10-CM

## 2022-11-21 DIAGNOSIS — N6459 Other signs and symptoms in breast: Secondary | ICD-10-CM | POA: Diagnosis not present

## 2022-12-08 ENCOUNTER — Ambulatory Visit (INDEPENDENT_AMBULATORY_CARE_PROVIDER_SITE_OTHER): Payer: Medicare Other | Admitting: Primary Care

## 2022-12-08 ENCOUNTER — Encounter: Payer: Self-pay | Admitting: Primary Care

## 2022-12-08 VITALS — BP 138/64 | HR 70 | Temp 98.0°F | Ht 73.0 in | Wt 218.2 lb

## 2022-12-08 DIAGNOSIS — G4733 Obstructive sleep apnea (adult) (pediatric): Secondary | ICD-10-CM | POA: Diagnosis not present

## 2022-12-08 NOTE — Assessment & Plan Note (Signed)
-   Patient has history of sleep apnea.  Home sleep study October 2017 showed mild OSA, AHI 13/hour. Maintained on CPAP for the last 7 years.  Current machine no longer working properly and feels pressure is not strong enough.  Patient is 73% compliant with use last 30 days.  Current pressure 5 to 12 cm H2O; residual AHI 1.6/hour. We will place DME order for patient to receive new CPAP machine, change pressure 5-13cm h20. Recommend trying philips dream wear full face mask. FU 31-90 days after receiving new CPAP for compliance check.

## 2022-12-08 NOTE — Progress Notes (Signed)
@Patient  ID: Alex Weiss, male    DOB: [birth year], 82 y.o.   MRN: [id number]  Chief Complaint  Patient presents with   Consult    Sleep Consult-has CPAP since 2018. Mask ok with chin strap, pr. Too weak    Referring provider: Nelwyn Salisbury, MD  HPI: 82 year old male, former smoker. PMH significant for hypertension, OSA, ulcerative colitis, osteoarthritis, total right hip arthroplasty.   12/08/2022 Former patient of Dr. Vassie Loll, he was last seen in July 2018.  History of sleep apnea.  Home sleep study in October 2017 showed mild OSA, AHI 13/hour. Patient presents today for sleep consult.  Current machine is greater than 5 years and patient does not feel that is working properly.  He feels pressure is not strong enough.  Typical bedtime is between 1030 and 11:30 PM.  It takes on average 10 minutes to fall asleep.  He wakes up 2-3 times a night.  He starts his day between 630 and 7 AM. Epworth 14. No concern for narcolepsy, cataplexy or sleep walking.   Airview download 11/08/2022 - 12/07/2022 Usage days 22/30 days (73%) Pressure 5 to 12 cm H2O Air leaks 5.5 L/min (95%) AHI 1.6   Significant tests/ events   HST 01/2016 mild overall with 13 events per hour, severe on his back with 33 events per hour  Allergies  Allergen Reactions   Ivp Dye [Iodinated Contrast Media] Hives   Penicillins Hives   Clindamycin/Lincomycin Rash    Immunization History  Administered Date(s) Administered   Fluad Quad(high Dose 65+) 01/26/2021, 12/22/2021   Influenza Split 02/09/2011, 01/20/2012, 01/28/2013   Influenza Whole 12/28/2007   Influenza, High Dose Seasonal PF 01/05/2016, 01/06/2017, 01/02/2018, 12/20/2019   Influenza-Unspecified 02/03/2007, 01/15/2014, 01/07/2015, 12/11/2018   PFIZER Comirnaty(Gray Top)Covid-19 Tri-Sucrose Vaccine 12/24/2021   PFIZER(Purple Top)SARS-COV-2 Vaccination 04/18/2019, 05/08/2019, 12/20/2019, 07/21/2020, 12/28/2020   Pneumococcal Conjugate-13 08/15/2013   Pneumococcal  Polysaccharide-23 08/03/2006, 08/02/2012   Tdap 08/15/2013   Zoster Recombinant(Shingrix) 10/18/2019, 01/23/2020   Zoster, Live 08/03/2007    Past Medical History:  Diagnosis Date   Allergy    Arthritis    BPH (benign prostatic hypertrophy)    sees Dr. Bjorn Pippin    Cataract    starting    FH: colonic polyps    GERD (gastroesophageal reflux disease)    Hematuria, microscopic    History of kidney stones    History of nephrolithiasis    1962   History of prostatitis    History of ulcerative colitis    Hypertension    OSA (obstructive sleep apnea)    Premature atrial contraction    PVC's (premature ventricular contractions)    Sleep apnea    uses CPAP   Wears glasses     Tobacco History: Social History   Tobacco Use  Smoking Status Former   Current packs/day: 0.00   Average packs/day: 2.0 packs/day for 17.0 years (34.0 ttl pk-yrs)   Types: Cigarettes   Start date: 04/04/1958   Quit date: 04/05/1975   Years since quitting: 47.7  Smokeless Tobacco Never   Counseling given: Not Answered   Outpatient Medications Prior to Visit  Medication Sig Dispense Refill   Calcium Carb-Cholecalciferol (CALCIUM 600 + D PO) Take 1 tablet by mouth daily.     cetirizine (ZYRTEC) 10 MG tablet Take 10 mg by mouth daily as needed for allergies.     famotidine (PEPCID) 20 MG tablet Take 20 mg by mouth 2 (two) times daily.  losartan (COZAAR) 25 MG tablet Take 1 tablet (25 mg total) by mouth daily. 90 tablet 3   metroNIDAZOLE (METROGEL) 0.75 % gel Apply 1 Application topically 2 (two) times daily as needed (rosacea).     Multiple Vitamin (MULTIVITAMIN) tablet Take 1 tablet by mouth daily.     silodosin (RAPAFLO) 8 MG CAPS capsule Take 8 mg by mouth daily.     sulindac (CLINORIL) 200 MG tablet Take 1 tablet (200 mg total) by mouth daily. 90 tablet 3   triamcinolone cream (KENALOG) 0.1 % Apply 1 Application topically 2 (two) times daily as needed (outter ear itching & dryness). 90 g 5    metaxalone (SKELAXIN) 400 MG tablet Take 2 tablets (800 mg total) by mouth 3 (three) times daily as needed (muscle spasms). (Patient not taking: Reported on 12/08/2022) 120 tablet 0   No facility-administered medications prior to visit.   Review of Systems  Review of Systems  Constitutional: Negative.  Negative for fatigue.  HENT: Negative.    Respiratory: Negative.    Cardiovascular: Negative.     Physical Exam  BP 138/64 (BP Location: Right Arm, Cuff Size: Normal)   Pulse 70   Temp 98 F (36.7 C) (Temporal)   Ht 6\' 1"  (1.854 m)   Wt 218 lb 3.2 oz (99 kg)   SpO2 97%   BMI 28.79 kg/m  Physical Exam Constitutional:      Appearance: Normal appearance.  HENT:     Head: Normocephalic and atraumatic.  Cardiovascular:     Rate and Rhythm: Normal rate and regular rhythm.  Pulmonary:     Effort: Pulmonary effort is normal.     Breath sounds: Normal breath sounds.  Skin:    General: Skin is warm and dry.  Neurological:     General: No focal deficit present.     Mental Status: He is alert and oriented to person, place, and time. Mental status is at baseline.  Psychiatric:        Mood and Affect: Mood normal.        Behavior: Behavior normal.        Thought Content: Thought content normal.        Judgment: Judgment normal.      Lab Results:  CBC    Component Value Date/Time   WBC 4.8 10/25/2022 0944   RBC 5.36 10/25/2022 0944   HGB 17.2 (H) 10/25/2022 0944   HCT 50.6 10/25/2022 0944   PLT 137.0 (L) 10/25/2022 0944   MCV 94.4 10/25/2022 0944   MCH 32.9 02/28/2022 0904   MCHC 33.9 10/25/2022 0944   RDW 12.8 10/25/2022 0944   LYMPHSABS 0.8 10/25/2022 0944   MONOABS 0.5 10/25/2022 0944   EOSABS 0.2 10/25/2022 0944   BASOSABS 0.0 10/25/2022 0944    BMET    Component Value Date/Time   NA 140 10/25/2022 0944   K 4.6 10/25/2022 0944   CL 102 10/25/2022 0944   CO2 30 10/25/2022 0944   GLUCOSE 112 (H) 10/25/2022 0944   BUN 24 (H) 10/25/2022 0944   CREATININE 0.98  10/25/2022 0944   CREATININE 0.94 10/14/2019 0852   CALCIUM 10.4 10/25/2022 0944   GFRNONAA >60 02/28/2022 0904   GFRAA >90 05/19/2014 1300    BNP No results found for: "BNP"  ProBNP No results found for: "PROBNP"  Imaging: MM 3D DIAGNOSTIC MAMMOGRAM BILATERAL BREAST  Result Date: 11/21/2022 CLINICAL DATA:  82 year old male complaining of a palpable abnormality in the 2:30 region of the left breast. EXAM:  DIGITAL DIAGNOSTIC BILATERAL MAMMOGRAM WITH TOMOSYNTHESIS AND CAD; ULTRASOUND LEFT BREAST LIMITED TECHNIQUE: Bilateral digital diagnostic mammography and breast tomosynthesis was performed. The images were evaluated with computer-aided detection. ; Targeted ultrasound examination of the left breast was performed. COMPARISON:  None available. ACR Breast Density Category b: There are scattered areas of fibroglandular density. FINDINGS: No suspicious mass, malignant type microcalcifications or distortion detected in either breast. Spot tangential view of the area of clinical concern in the left breast shows normal fibroglandular tissue. On physical exam, I do not palpate a mass in the patient's area of clinical concern in the 2:30 region of the left breast 2 cm from the nipple. Targeted ultrasound is performed, showing normal tissue in the patient's area of clinical concern in the 2:30 region of the left breast 2 cm from the nipple. IMPRESSION: No evidence of malignancy in either breast. RECOMMENDATION: If the clinical exam remains benign/stable no follow-up is needed. I have discussed the findings and recommendations with the patient. If applicable, a reminder letter will be sent to the patient regarding the next appointment. BI-RADS CATEGORY  1: Negative. Electronically Signed   By: Baird Lyons M.D.   On: 11/21/2022 15:03   [place] LIMITED ULTRASOUND INCLUDING AXILLA LEFT BREAST   Result Date: 11/21/2022 CLINICAL DATA:  82 year old male complaining of a palpable abnormality in the 2:30 region of the  left breast. EXAM: DIGITAL DIAGNOSTIC BILATERAL MAMMOGRAM WITH TOMOSYNTHESIS AND CAD; ULTRASOUND LEFT BREAST LIMITED TECHNIQUE: Bilateral digital diagnostic mammography and breast tomosynthesis was performed. The images were evaluated with computer-aided detection. ; Targeted ultrasound examination of the left breast was performed. COMPARISON:  None available. ACR Breast Density Category b: There are scattered areas of fibroglandular density. FINDINGS: No suspicious mass, malignant type microcalcifications or distortion detected in either breast. Spot tangential view of the area of clinical concern in the left breast shows normal fibroglandular tissue. On physical exam, I do not palpate a mass in the patient's area of clinical concern in the 2:30 region of the left breast 2 cm from the nipple. Targeted ultrasound is performed, showing normal tissue in the patient's area of clinical concern in the 2:30 region of the left breast 2 cm from the nipple. IMPRESSION: No evidence of malignancy in either breast. RECOMMENDATION: If the clinical exam remains benign/stable no follow-up is needed. I have discussed the findings and recommendations with the patient. If applicable, a reminder letter will be sent to the patient regarding the next appointment. BI-RADS CATEGORY  1: Negative. Electronically Signed   By: Baird Lyons M.D.   On: 11/21/2022 15:03     Assessment & Plan:   OSA (obstructive sleep apnea) - Patient has history of sleep apnea.  Home sleep study October 2017 showed mild OSA, AHI 13/hour. Maintained on CPAP for the last 7 years.  Current machine no longer working properly and feels pressure is not strong enough.  Patient is 73% compliant with use last 30 days.  Current pressure 5 to 12 cm H2O; residual AHI 1.6/hour. We will place DME order for patient to receive new CPAP machine, change pressure 5-13cm h20. Recommend trying philips dream wear full face mask. FU 31-90 days after receiving new CPAP for  compliance check.    Glenford Bayley, NP 12/08/2022

## 2022-12-08 NOTE — Patient Instructions (Signed)
Orders: CPAP auto 5-13cm h20; mask fitting for philips dream wear full face mask  Follow-up: FU in 8-12 weeks with Beth NP for CPAP compliance after getting new machine/then annually    CPAP and BIPAP Information CPAP and BIPAP are methods that use air pressure to keep your airways open and to help you breathe well. CPAP and BIPAP use different amounts of pressure. Your health care provider will tell you whether CPAP or BIPAP would be more helpful for you. CPAP stands for "continuous positive airway pressure." With CPAP, the amount of pressure stays the same while you breathe in (inhale) and out (exhale). BIPAP stands for "bi-level positive airway pressure." With BIPAP, the amount of pressure will be higher when you inhale and lower when you exhale. This allows you to take larger breaths. CPAP or BIPAP may be used in the hospital, or your health care provider may want you to use it at home. You may need to have a sleep study before your health care provider can order a machine for you to use at home. What are the advantages? CPAP or BIPAP can be helpful if you have: Sleep apnea. Chronic obstructive pulmonary disease (COPD). Heart failure. Medical conditions that cause muscle weakness, including muscular dystrophy or amyotrophic lateral sclerosis (ALS). Other problems that cause breathing to be shallow, weak, abnormal, or difficult. CPAP and BIPAP are most commonly used for obstructive sleep apnea (OSA) to keep the airways from collapsing when the muscles relax during sleep. What are the risks? Generally, this is a safe treatment. However, problems may occur, including: Irritated skin or skin sores if the mask does not fit properly. Dry or stuffy nose or nosebleeds. Dry mouth. Feeling gassy or bloated. Sinus or lung infection if the equipment is not cleaned properly. When should CPAP or BIPAP be used? In most cases, the mask only needs to be worn during sleep. Generally, the mask needs to  be worn throughout the night and during any daytime naps. People with certain medical conditions may also need to wear the mask at other times, such as when they are awake. Follow instructions from your health care provider about when to use the machine. What happens during CPAP or BIPAP?  Both CPAP and BIPAP are provided by a small machine with a flexible plastic tube that attaches to a plastic mask that you wear. Air is blown through the mask into your nose or mouth. The amount of pressure that is used to blow the air can be adjusted on the machine. Your health care provider will set the pressure setting and help you find the best mask for you. Tips for using the mask Because the mask needs to be snug, some people feel trapped or closed-in (claustrophobic) when first using the mask. If you feel this way, you may need to get used to the mask. One way to do this is to hold the mask loosely over your nose or mouth and then gradually apply the mask more snugly. You can also gradually increase the amount of time that you use the mask. Masks are available in various types and sizes. If your mask does not fit well, talk with your health care provider about getting a different one. Some common types of masks include: Full face masks, which fit over the mouth and nose. Nasal masks, which fit over the nose. Nasal pillow or prong masks, which fit into the nostrils. If you are using a mask that fits over your nose and you tend  to breathe through your mouth, a chin strap may be applied to help keep your mouth closed. Use a skin barrier to protect your skin as told by your health care provider. Some CPAP and BIPAP machines have alarms that may sound if the mask comes off or develops a leak. If you have trouble with the mask, it is very important that you talk with your health care provider about finding a way to make the mask easier to tolerate. Do not stop using the mask. There could be a negative impact on your  health if you stop using the mask. Tips for using the machine Place your CPAP or BIPAP machine on a secure table or stand near an electrical outlet. Know where the on/off switch is on the machine. Follow instructions from your health care provider about how to set the pressure on your machine and when you should use it. Do not eat or drink while the CPAP or BIPAP machine is on. Food or fluids could get pushed into your lungs by the pressure of the CPAP or BIPAP. For home use, CPAP and BIPAP machines can be rented or purchased through home health care companies. Many different brands of machines are available. Renting a machine before purchasing may help you find out which particular machine works well for you. Your health insurance company may also decide which machine you may get. Keep the CPAP or BIPAP machine and attachments clean. Ask your health care provider for specific instructions. Check the humidifier if you have a dry stuffy nose or nosebleeds. Make sure it is working correctly. Follow these instructions at home: Take over-the-counter and prescription medicines only as told by your health care provider. Ask if you can take sinus medicine if your sinuses are blocked. Do not use any products that contain nicotine or tobacco. These products include cigarettes, chewing tobacco, and vaping devices, such as e-cigarettes. If you need help quitting, ask your health care provider. Keep all follow-up visits. This is important. Contact a health care provider if: You have redness or pressure sores on your head, face, mouth, or nose from the mask or head gear. You have trouble using the CPAP or BIPAP machine. You cannot tolerate wearing the CPAP or BIPAP mask. Someone tells you that you snore even when wearing your CPAP or BIPAP. Get help right away if: You have trouble breathing. You feel confused. Summary CPAP and BIPAP are methods that use air pressure to keep your airways open and to help you  breathe well. If you have trouble with the mask, it is very important that you talk with your health care provider about finding a way to make the mask easier to tolerate. Do not stop using the mask. There could be a negative impact to your health if you stop using the mask. Follow instructions from your health care provider about when to use the machine. This information is not intended to replace advice given to you by your health care provider. Make sure you discuss any questions you have with your health care provider. Document Revised: 10/28/2020 Document Reviewed: 02/28/2020 Elsevier Patient Education  2023 [employer].

## 2022-12-13 DIAGNOSIS — Z96641 Presence of right artificial hip joint: Secondary | ICD-10-CM | POA: Diagnosis not present

## 2023-01-23 ENCOUNTER — Encounter: Payer: Self-pay | Admitting: Family Medicine

## 2023-01-23 DIAGNOSIS — Z23 Encounter for immunization: Secondary | ICD-10-CM | POA: Diagnosis not present

## 2023-01-25 DIAGNOSIS — R3129 Other microscopic hematuria: Secondary | ICD-10-CM | POA: Diagnosis not present

## 2023-01-25 DIAGNOSIS — N200 Calculus of kidney: Secondary | ICD-10-CM | POA: Diagnosis not present

## 2023-01-25 DIAGNOSIS — N401 Enlarged prostate with lower urinary tract symptoms: Secondary | ICD-10-CM | POA: Diagnosis not present

## 2023-01-25 DIAGNOSIS — R351 Nocturia: Secondary | ICD-10-CM | POA: Diagnosis not present

## 2023-02-02 ENCOUNTER — Ambulatory Visit: Payer: Medicare Other | Admitting: Primary Care

## 2023-02-02 ENCOUNTER — Encounter: Payer: Self-pay | Admitting: Primary Care

## 2023-02-02 ENCOUNTER — Telehealth: Payer: Self-pay | Admitting: *Deleted

## 2023-02-02 VITALS — BP 118/68 | HR 70 | Temp 97.6°F | Ht 73.0 in | Wt 215.6 lb

## 2023-02-02 DIAGNOSIS — G4733 Obstructive sleep apnea (adult) (pediatric): Secondary | ICD-10-CM | POA: Diagnosis not present

## 2023-02-02 NOTE — Progress Notes (Signed)
@Patient  ID: Alex Weiss, male    DOB: 1940/10/15, 82 y.o.   MRN: [id number]  Chief Complaint  Patient presents with   Follow-up    Wearing CPAP-pr. Is fine when goes to bed but towards morning leaking    Referring provider: Nelwyn Salisbury, MD  HPI: 82 year old male, former smoker. PMH significant for hypertension, OSA, ulcerative colitis, osteoarthritis, total right hip arthroplasty.   Previous [facility] pulmonary encounter:  12/08/2022 Former patient of Dr. Vassie Loll, he was last seen in July 2018.  History of sleep apnea.  Home sleep study in October 2017 showed mild OSA, AHI 13/hour. Patient presents today for sleep consult.  Current machine is greater than 5 years and patient does not feel that is working properly.  He feels pressure is not strong enough.  Typical bedtime is between 1030 and 11:30 PM.  It takes on average 10 minutes to fall asleep.  He wakes up 2-3 times a night.  He starts his day between 630 and 7 AM. [place] 14. No concern for narcolepsy, cataplexy or sleep walking.   [facility] download 11/08/2022 - 12/07/2022 Usage days 22/30 days (73%) Pressure 5 to 12 cm H2O Air leaks 5.5 L/min (95%) AHI 1.6   02/02/2023- interim hx  Patient presents today for 2-3 month follow-up. Since our last visit he received new CPAP machine and was re-fitted for mask. He is using philips dream wear full face mask. He is doing well except reports experiencing some airleaks early morning, otherwise no issues. Requesting download from [employer].    Significant tests/ events HST 01/2016 mild overall with 13 events per hour, severe on his back with 33 events per hour   Allergies  Allergen Reactions   Ivp Dye [Iodinated Contrast Media] Hives   Penicillins Hives   Clindamycin/Lincomycin Rash    Immunization History  Administered Date(s) Administered   Fluad Quad(high Dose 65+) 01/26/2021, 12/22/2021   Influenza Split 02/09/2011, 01/20/2012, 01/28/2013   Influenza Whole 12/28/2007   Influenza, High  Dose Seasonal PF 01/05/2016, 01/06/2017, 01/02/2018, 12/20/2019, 01/23/2023   Influenza-Unspecified 02/03/2007, 01/15/2014, 01/07/2015, 12/11/2018   PFIZER Comirnaty(Gray Top)Covid-19 Tri-Sucrose Vaccine 12/24/2021   PFIZER(Purple Top)SARS-COV-2 Vaccination 04/18/2019, 05/08/2019, 12/20/2019, 07/21/2020, 12/28/2020   Pfizer(Comirnaty)Fall Seasonal Vaccine 12 years and older 01/23/2023   Pneumococcal Conjugate-13 08/15/2013   Pneumococcal Polysaccharide-23 08/03/2006, 08/02/2012   Tdap 08/15/2013   Zoster Recombinant(Shingrix) 10/18/2019, 01/23/2020   Zoster, Live 08/03/2007    Past Medical History:  Diagnosis Date   Allergy    Arthritis    BPH (benign prostatic hypertrophy)    sees Dr. Bjorn Pippin    Cataract    starting    FH: colonic polyps    GERD (gastroesophageal reflux disease)    Hematuria, microscopic    History of kidney stones    History of nephrolithiasis    1962   History of prostatitis    History of ulcerative colitis    Hypertension    OSA (obstructive sleep apnea)    Premature atrial contraction    PVC's (premature ventricular contractions)    Sleep apnea    uses CPAP   Wears glasses     Tobacco History: Social History   Tobacco Use  Smoking Status Former   Current packs/day: 0.00   Average packs/day: 2.0 packs/day for 17.0 years (34.0 ttl pk-yrs)   Types: Cigarettes   Start date: 04/04/1958   Quit date: 04/05/1975   Years since quitting: 47.8  Smokeless Tobacco Never   Counseling given: Not  Answered   Outpatient Medications Prior to Visit  Medication Sig Dispense Refill   Calcium Carb-Cholecalciferol (CALCIUM 600 + D PO) Take 1 tablet by mouth daily.     cetirizine (ZYRTEC) 10 MG tablet Take 10 mg by mouth daily as needed for allergies.     famotidine (PEPCID) 20 MG tablet Take 20 mg by mouth 2 (two) times daily.      losartan (COZAAR) 25 MG tablet Take 1 tablet (25 mg total) by mouth daily. 90 tablet 3   metaxalone (SKELAXIN) 400 MG tablet Take 2  tablets (800 mg total) by mouth 3 (three) times daily as needed (muscle spasms). 120 tablet 0   metroNIDAZOLE (METROGEL) 0.75 % gel Apply 1 Application topically 2 (two) times daily as needed (rosacea).     Multiple Vitamin (MULTIVITAMIN) tablet Take 1 tablet by mouth daily.     silodosin (RAPAFLO) 8 MG CAPS capsule Take 8 mg by mouth daily.     sulindac (CLINORIL) 200 MG tablet Take 1 tablet (200 mg total) by mouth daily. 90 tablet 3   triamcinolone cream (KENALOG) 0.1 % Apply 1 Application topically 2 (two) times daily as needed (outter ear itching & dryness). 90 g 5   No facility-administered medications prior to visit.   Review of Systems  Review of Systems  Constitutional: Negative.  Negative for fatigue.  Respiratory: Negative.    Cardiovascular: Negative.   Psychiatric/Behavioral:  Negative for sleep disturbance.    Physical Exam  BP 118/68 (BP Location: Left Arm, Cuff Size: Normal)   Pulse 70   Temp 97.6 F (36.4 C) (Temporal)   Ht 6\' 1"  (1.854 m)   Wt 215 lb 9.6 oz (97.8 kg)   SpO2 99%   BMI 28.44 kg/m  Physical Exam Constitutional:      Appearance: Normal appearance.  HENT:     Head: Normocephalic and atraumatic.  Cardiovascular:     Rate and Rhythm: Normal rate and regular rhythm.  Pulmonary:     Effort: Pulmonary effort is normal.     Breath sounds: Normal breath sounds.  Musculoskeletal:        General: Normal range of motion.  Skin:    General: Skin is warm and dry.  Neurological:     General: No focal deficit present.     Mental Status: He is alert and oriented to person, place, and time. Mental status is at baseline.  Psychiatric:        Mood and Affect: Mood normal.        Behavior: Behavior normal.        Thought Content: Thought content normal.        Judgment: Judgment normal.      Lab Results:  CBC    Component Value Date/Time   WBC 4.8 10/25/2022 0944   RBC 5.36 10/25/2022 0944   HGB 17.2 (H) 10/25/2022 0944   HCT 50.6 10/25/2022 0944    PLT 137.0 (L) 10/25/2022 0944   MCV 94.4 10/25/2022 0944   MCH 32.9 02/28/2022 0904   MCHC 33.9 10/25/2022 0944   RDW 12.8 10/25/2022 0944   LYMPHSABS 0.8 10/25/2022 0944   MONOABS 0.5 10/25/2022 0944   EOSABS 0.2 10/25/2022 0944   BASOSABS 0.0 10/25/2022 0944    BMET    Component Value Date/Time   NA 140 10/25/2022 0944   K 4.6 10/25/2022 0944   CL 102 10/25/2022 0944   CO2 30 10/25/2022 0944   GLUCOSE 112 (H) 10/25/2022 0944   BUN 24 (  H) 10/25/2022 0944   CREATININE 0.98 10/25/2022 0944   CREATININE 0.94 10/14/2019 0852   CALCIUM 10.4 10/25/2022 0944   GFRNONAA >60 02/28/2022 0904   GFRAA >90 05/19/2014 1300    BNP No results found for: "BNP"  ProBNP No results found for: "PROBNP"  Imaging: No results found.   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 Since last OV received new CPAP machine and was re-fitted for mask. Using Philips dream wear full face mask He is experiencing some airleaks early morning, otherwise, no complaints. Requested download from [facility]. We will see if we can lower max pressure setting or would try set pressure to help with airleaks. Advised patient continue to wear CPAP nightly 4-6 hours, work on weight loss and look into getting wedge pillow to elevate head while sleeping. Replace CPAP supplies as directed. FU in 1 year or sooner if needed.   Glenford Bayley, NP 02/02/2023

## 2023-02-02 NOTE — Assessment & Plan Note (Addendum)
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 Since last OV received new CPAP machine and was re-fitted for mask. Using Philips dream wear full face mask He is experiencing some airleaks early morning, otherwise, no complaints. Requested download from [facility]. We will see if we can lower max pressure setting or would try set pressure to help with airleaks. Advised patient continue to wear CPAP nightly 4-6 hours, work on weight loss and look into getting wedge pillow to elevate head while sleeping. Replace CPAP supplies as directed. FU in 1 year or sooner if needed.

## 2023-02-02 NOTE — Telephone Encounter (Signed)
Spoke with Alex Weiss at [facility]. Requested a download for patients CPAP machine. He states he rec'd a new machine in Sept. 2024. Provided her the fax number-she stated she would fax soon.

## 2023-02-02 NOTE — Patient Instructions (Signed)
We will adjust pressure setting to see if this helps airleaks in the morning. Call or send me a mychart message if change is not agreeing with you. Continue to wear CPAP nightly 4-6 hours or longer.  I would replace dream wear full face mask every 3 months or when there are signs of ware/tear  Replace filter months, tubing every 3 months, headgear and water chamber every 6 months   Follow-up: 1 year with [facility] NP or sooner if needed   CPAP and BIPAP Information CPAP and BIPAP are methods that use air pressure to keep your airways open and to help you breathe well. CPAP and BIPAP use different amounts of pressure. Your health care provider will tell you whether CPAP or BIPAP would be more helpful for you. CPAP stands for "continuous positive airway pressure." With CPAP, the amount of pressure stays the same while you breathe in (inhale) and out (exhale). BIPAP stands for "bi-level positive airway pressure." With BIPAP, the amount of pressure will be higher when you inhale and lower when you exhale. This allows you to take larger breaths. CPAP or BIPAP may be used in the hospital, or your health care provider may want you to use it at home. You may need to have a sleep study before your health care provider can order a machine for you to use at home. What are the advantages? CPAP or BIPAP can be helpful if you have: Sleep apnea. Chronic obstructive pulmonary disease (COPD). Heart failure. Medical conditions that cause muscle weakness, including muscular dystrophy or amyotrophic lateral sclerosis (ALS). Other problems that cause breathing to be shallow, weak, abnormal, or difficult. CPAP and BIPAP are most commonly used for obstructive sleep apnea (OSA) to keep the airways from collapsing when the muscles relax during sleep. What are the risks? Generally, this is a safe treatment. However, problems may occur, including: Irritated skin or skin sores if the mask does not fit properly. Dry or stuffy  nose or nosebleeds. Dry mouth. Feeling gassy or bloated. Sinus or lung infection if the equipment is not cleaned properly. When should CPAP or BIPAP be used? In most cases, the mask only needs to be worn during sleep. Generally, the mask needs to be worn throughout the night and during any daytime naps. People with certain medical conditions may also need to wear the mask at other times, such as when they are awake. Follow instructions from your health care provider about when to use the machine. What happens during CPAP or BIPAP?  Both CPAP and BIPAP are provided by a small machine with a flexible plastic tube that attaches to a plastic mask that you wear. Air is blown through the mask into your nose or mouth. The amount of pressure that is used to blow the air can be adjusted on the machine. Your health care provider will set the pressure setting and help you find the best mask for you. Tips for using the mask Because the mask needs to be snug, some people feel trapped or closed-in (claustrophobic) when first using the mask. If you feel this way, you may need to get used to the mask. One way to do this is to hold the mask loosely over your nose or mouth and then gradually apply the mask more snugly. You can also gradually increase the amount of time that you use the mask. Masks are available in various types and sizes. If your mask does not fit well, talk with your health care provider about  getting a different one. Some common types of masks include: Full face masks, which fit over the mouth and nose. Nasal masks, which fit over the nose. Nasal pillow or prong masks, which fit into the nostrils. If you are using a mask that fits over your nose and you tend to breathe through your mouth, a chin strap may be applied to help keep your mouth closed. Use a skin barrier to protect your skin as told by your health care provider. Some CPAP and BIPAP machines have alarms that may sound if the mask comes  off or develops a leak. If you have trouble with the mask, it is very important that you talk with your health care provider about finding a way to make the mask easier to tolerate. Do not stop using the mask. There could be a negative impact on your health if you stop using the mask. Tips for using the machine Place your CPAP or BIPAP machine on a secure table or stand near an electrical outlet. Know where the on/off switch is on the machine. Follow instructions from your health care provider about how to set the pressure on your machine and when you should use it. Do not eat or drink while the CPAP or BIPAP machine is on. Food or fluids could get pushed into your lungs by the pressure of the CPAP or BIPAP. For home use, CPAP and BIPAP machines can be rented or purchased through home health care companies. Many different brands of machines are available. Renting a machine before purchasing may help you find out which particular machine works well for you. Your health insurance company may also decide which machine you may get. Keep the CPAP or BIPAP machine and attachments clean. Ask your health care provider for specific instructions. Check the humidifier if you have a dry stuffy nose or nosebleeds. Make sure it is working correctly. Follow these instructions at home: Take over-the-counter and prescription medicines only as told by your health care provider. Ask if you can take sinus medicine if your sinuses are blocked. Do not use any products that contain nicotine or tobacco. These products include cigarettes, chewing tobacco, and vaping devices, such as e-cigarettes. If you need help quitting, ask your health care provider. Keep all follow-up visits. This is important. Contact a health care provider if: You have redness or pressure sores on your head, face, mouth, or nose from the mask or head gear. You have trouble using the CPAP or BIPAP machine. You cannot tolerate wearing the CPAP or BIPAP  mask. Someone tells you that you snore even when wearing your CPAP or BIPAP. Get help right away if: You have trouble breathing. You feel confused. Summary CPAP and BIPAP are methods that use air pressure to keep your airways open and to help you breathe well. If you have trouble with the mask, it is very important that you talk with your health care provider about finding a way to make the mask easier to tolerate. Do not stop using the mask. There could be a negative impact to your health if you stop using the mask. Follow instructions from your health care provider about when to use the machine. This information is not intended to replace advice given to you by your health care provider. Make sure you discuss any questions you have with your health care provider. Document Revised: 10/28/2020 Document Reviewed: 02/28/2020 Elsevier Patient Education  2023 [employer].

## 2023-02-09 ENCOUNTER — Other Ambulatory Visit: Payer: Medicare Other

## 2023-02-10 ENCOUNTER — Other Ambulatory Visit: Payer: Medicare Other

## 2023-02-10 DIAGNOSIS — R17 Unspecified jaundice: Secondary | ICD-10-CM

## 2023-02-10 LAB — HEPATIC FUNCTION PANEL
ALT: 16 U/L (ref 0–53)
AST: 18 U/L (ref 0–37)
Albumin: 4.3 g/dL (ref 3.5–5.2)
Alkaline Phosphatase: 82 U/L (ref 39–117)
Bilirubin, Direct: 0.2 mg/dL (ref 0.0–0.3)
Total Bilirubin: 0.9 mg/dL (ref 0.2–1.2)
Total Protein: 6.6 g/dL (ref 6.0–8.3)

## 2023-03-14 DIAGNOSIS — Z96641 Presence of right artificial hip joint: Secondary | ICD-10-CM | POA: Diagnosis not present

## 2023-04-12 ENCOUNTER — Encounter: Payer: Self-pay | Admitting: Family Medicine

## 2023-04-12 ENCOUNTER — Ambulatory Visit: Payer: Medicare Other | Admitting: Family Medicine

## 2023-04-12 VITALS — BP 118/70 | HR 74 | Temp 98.2°F | Wt 217.0 lb

## 2023-04-12 DIAGNOSIS — J40 Bronchitis, not specified as acute or chronic: Secondary | ICD-10-CM

## 2023-04-12 DIAGNOSIS — R059 Cough, unspecified: Secondary | ICD-10-CM | POA: Diagnosis not present

## 2023-04-12 LAB — POC COVID19 BINAXNOW: SARS Coronavirus 2 Ag: NEGATIVE

## 2023-04-12 MED ORDER — AZITHROMYCIN 250 MG PO TABS: ORAL_TABLET | ORAL | 0 refills | Status: DC

## 2023-04-12 NOTE — Progress Notes (Signed)
   Subjective:    Patient ID: ZAEVION PARKE, male    DOB: 03/30/1941, 83 y.o.   MRN: [id number]  HPI Here for 6 days of stuffy head, chest congestion, and coughing up yellow sputum. No ST or fever or SOB. Using Coricidin.    Review of Systems  Constitutional: Negative.   HENT:  Positive for congestion. Negative for ear pain, postnasal drip, sinus pressure and sore throat.   Eyes: Negative.   Respiratory:  Positive for cough. Negative for shortness of breath and wheezing.        Objective:   Physical Exam Constitutional:      Appearance: Normal appearance. He is not ill-appearing.  HENT:     Right Ear: Tympanic membrane, ear canal and external ear normal.     Left Ear: Tympanic membrane, ear canal and external ear normal.     Nose: Nose normal.     Mouth/Throat:     Pharynx: Oropharynx is clear.  Eyes:     Conjunctiva/sclera: Conjunctivae normal.  Pulmonary:     Effort: Pulmonary effort is normal.     Breath sounds: Rhonchi present. No wheezing or rales.  Lymphadenopathy:     Cervical: No cervical adenopathy.  Neurological:     Mental Status: He is alert.           Assessment & Plan:  Bronchitis, treat with a Zpack. Garnette Olmsted, MD

## 2023-04-12 NOTE — Addendum Note (Signed)
 Addended by: Carola Rhine on: 04/12/2023 05:11 PM   Modules accepted: Orders

## 2023-09-18 DIAGNOSIS — N401 Enlarged prostate with lower urinary tract symptoms: Secondary | ICD-10-CM | POA: Diagnosis not present

## 2023-09-25 DIAGNOSIS — R3915 Urgency of urination: Secondary | ICD-10-CM | POA: Diagnosis not present

## 2023-09-25 DIAGNOSIS — N200 Calculus of kidney: Secondary | ICD-10-CM | POA: Diagnosis not present

## 2023-09-25 DIAGNOSIS — N401 Enlarged prostate with lower urinary tract symptoms: Secondary | ICD-10-CM | POA: Diagnosis not present

## 2023-09-25 DIAGNOSIS — R351 Nocturia: Secondary | ICD-10-CM | POA: Diagnosis not present

## 2023-10-30 ENCOUNTER — Encounter: Payer: Self-pay | Admitting: Family Medicine

## 2023-10-30 ENCOUNTER — Ambulatory Visit (INDEPENDENT_AMBULATORY_CARE_PROVIDER_SITE_OTHER): Admitting: Family Medicine

## 2023-10-30 VITALS — BP 126/80 | HR 63 | Temp 97.7°F | Ht 73.0 in | Wt 212.0 lb

## 2023-10-30 DIAGNOSIS — R739 Hyperglycemia, unspecified: Secondary | ICD-10-CM | POA: Diagnosis not present

## 2023-10-30 DIAGNOSIS — I10 Essential (primary) hypertension: Secondary | ICD-10-CM

## 2023-10-30 DIAGNOSIS — K5190 Ulcerative colitis, unspecified, without complications: Secondary | ICD-10-CM

## 2023-10-30 DIAGNOSIS — M150 Primary generalized (osteo)arthritis: Secondary | ICD-10-CM

## 2023-10-30 DIAGNOSIS — G4733 Obstructive sleep apnea (adult) (pediatric): Secondary | ICD-10-CM

## 2023-10-30 DIAGNOSIS — N138 Other obstructive and reflux uropathy: Secondary | ICD-10-CM

## 2023-10-30 DIAGNOSIS — E782 Mixed hyperlipidemia: Secondary | ICD-10-CM | POA: Diagnosis not present

## 2023-10-30 LAB — CBC WITH DIFFERENTIAL/PLATELET
Basophils Absolute: 0 K/uL (ref 0.0–0.1)
Basophils Relative: 0.4 % (ref 0.0–3.0)
Eosinophils Absolute: 0.2 K/uL (ref 0.0–0.7)
Eosinophils Relative: 4 % (ref 0.0–5.0)
HCT: 46.5 % (ref 39.0–52.0)
Hemoglobin: 16.1 g/dL (ref 13.0–17.0)
Lymphocytes Relative: 18.4 % (ref 12.0–46.0)
Lymphs Abs: 0.7 K/uL (ref 0.7–4.0)
MCHC: 34.7 g/dL (ref 30.0–36.0)
MCV: 93.2 fl (ref 78.0–100.0)
Monocytes Absolute: 0.4 K/uL (ref 0.1–1.0)
Monocytes Relative: 9.7 % (ref 3.0–12.0)
Neutro Abs: 2.6 K/uL (ref 1.4–7.7)
Neutrophils Relative %: 67.5 % (ref 43.0–77.0)
Platelets: 121 K/uL — ABNORMAL LOW (ref 150.0–400.0)
RBC: 4.99 Mil/uL (ref 4.22–5.81)
RDW: 12.8 % (ref 11.5–15.5)
WBC: 3.9 K/uL — ABNORMAL LOW (ref 4.0–10.5)

## 2023-10-30 LAB — LIPID PANEL
Cholesterol: 176 mg/dL (ref 0–200)
HDL: 45.6 mg/dL (ref >39.00)
LDL Cholesterol: 114 mg/dL — ABNORMAL HIGH (ref 0–99)
NonHDL: 130.12
Total CHOL/HDL Ratio: 4
Triglycerides: 80 mg/dL (ref 0.0–149.0)
VLDL: 16 mg/dL (ref 0.0–40.0)

## 2023-10-30 LAB — BASIC METABOLIC PANEL WITH GFR
BUN: 25 mg/dL — ABNORMAL HIGH (ref 6–23)
CO2: 29 meq/L (ref 19–32)
Calcium: 9.3 mg/dL (ref 8.4–10.5)
Chloride: 105 meq/L (ref 96–112)
Creatinine, Ser: 0.84 mg/dL (ref 0.40–1.50)
GFR: 80.81 mL/min (ref >60.00)
Glucose, Bld: 115 mg/dL — ABNORMAL HIGH (ref 70–99)
Potassium: 4.3 meq/L (ref 3.5–5.1)
Sodium: 140 meq/L (ref 135–145)

## 2023-10-30 LAB — HEPATIC FUNCTION PANEL
ALT: 13 U/L (ref 0–53)
AST: 15 U/L (ref 0–37)
Albumin: 4.5 g/dL (ref 3.5–5.2)
Alkaline Phosphatase: 70 U/L (ref 39–117)
Bilirubin, Direct: 0.2 mg/dL (ref 0.0–0.3)
Total Bilirubin: 1 mg/dL (ref 0.2–1.2)
Total Protein: 6.8 g/dL (ref 6.0–8.3)

## 2023-10-30 LAB — HEMOGLOBIN A1C: Hgb A1c MFr Bld: 5.7 % (ref 4.6–6.5)

## 2023-10-30 MED ORDER — SULINDAC 200 MG PO TABS: 200.0000 mg | ORAL_TABLET | Freq: Every day | ORAL | 3 refills | Status: AC

## 2023-10-30 MED ORDER — METAXALONE 400 MG PO TABS: 800.0000 mg | ORAL_TABLET | Freq: Three times a day (TID) | ORAL | 0 refills | Status: AC | PRN

## 2023-10-30 MED ORDER — LOSARTAN POTASSIUM 25 MG PO TABS: 25.0000 mg | ORAL_TABLET | Freq: Every day | ORAL | 3 refills | Status: AC

## 2023-10-30 MED ORDER — TRIAMCINOLONE ACETONIDE 0.1 % EX CREA: 1.0000 | TOPICAL_CREAM | Freq: Two times a day (BID) | CUTANEOUS | 5 refills | Status: AC | PRN

## 2023-10-30 NOTE — Progress Notes (Signed)
 Subjective:    Patient ID: Alex Weiss, male    DOB: [birth year], 83 y.o.   MRN: [id number]  HPI Here to follow up on issues. He is doing well overall. He saw Dr. Brunetta last month for a prostate check and his PSA was normal. His OA is about the same, and he takes Tylenol  as needed. His ulcerative colitis is quiescent. His BP is stable.    Review of Systems  Constitutional: Negative.   HENT: Negative.    Eyes: Negative.   Respiratory: Negative.    Cardiovascular: Negative.   Gastrointestinal: Negative.   Genitourinary: Negative.   Musculoskeletal:  Positive for arthralgias.  Skin: Negative.   Neurological: Negative.   Psychiatric/Behavioral: Negative.         Objective:   Physical Exam Constitutional:      General: He is not in acute distress.    Appearance: Normal appearance. He is well-developed. He is not diaphoretic.  HENT:     Head: Normocephalic and atraumatic.     Right Ear: External ear normal.     Left Ear: External ear normal.     Nose: Nose normal.     Mouth/Throat:     Pharynx: No oropharyngeal exudate.  Eyes:     General: No scleral icterus.       Right eye: No discharge.        Left eye: No discharge.     Conjunctiva/sclera: Conjunctivae normal.     Pupils: Pupils are equal, round, and reactive to light.  Neck:     Thyroid : No thyromegaly.     Vascular: No JVD.     Trachea: No tracheal deviation.  Cardiovascular:     Rate and Rhythm: Normal rate and regular rhythm.     Pulses: Normal pulses.     Heart sounds: Normal heart sounds. No murmur heard.    No friction rub. No gallop.  Pulmonary:     Effort: Pulmonary effort is normal. No respiratory distress.     Breath sounds: Normal breath sounds. No wheezing or rales.  Chest:     Chest wall: No tenderness.  Abdominal:     General: Bowel sounds are normal. There is no distension.     Palpations: Abdomen is soft. There is no mass.     Tenderness: There is no abdominal tenderness. There is no guarding  or rebound.  Genitourinary:    Penis: No tenderness.   Musculoskeletal:        General: No tenderness. Normal range of motion.     Cervical back: Neck supple.  Lymphadenopathy:     Cervical: No cervical adenopathy.  Skin:    General: Skin is warm and dry.     Coloration: Skin is not pale.     Findings: No erythema or rash.  Neurological:     General: No focal deficit present.     Mental Status: He is alert and oriented to person, place, and time.     Cranial Nerves: No cranial nerve deficit.     Motor: No abnormal muscle tone.     Coordination: Coordination normal.     Deep Tendon Reflexes: Reflexes are normal and symmetric. Reflexes normal.  Psychiatric:        Mood and Affect: Mood normal.        Behavior: Behavior normal.        Thought Content: Thought content normal.        Judgment: Judgment normal.  Assessment & Plan:  His HTN and OA are stable. His UC is stable. We will get labs to check A1c, lipids, etc. We spent a total of (33   ) minutes reviewing records and discussing these issues.  Garnette Olmsted, MD

## 2023-10-31 DIAGNOSIS — M7061 Trochanteric bursitis, right hip: Secondary | ICD-10-CM | POA: Diagnosis not present

## 2023-10-31 LAB — TSH: TSH: 1.18 u[IU]/mL (ref 0.35–5.50)

## 2023-11-02 ENCOUNTER — Ambulatory Visit: Payer: Self-pay | Admitting: Family Medicine

## 2023-12-06 DIAGNOSIS — D1801 Hemangioma of skin and subcutaneous tissue: Secondary | ICD-10-CM | POA: Diagnosis not present

## 2023-12-06 DIAGNOSIS — L718 Other rosacea: Secondary | ICD-10-CM | POA: Diagnosis not present

## 2023-12-06 DIAGNOSIS — L821 Other seborrheic keratosis: Secondary | ICD-10-CM | POA: Diagnosis not present

## 2023-12-06 DIAGNOSIS — L812 Freckles: Secondary | ICD-10-CM | POA: Diagnosis not present

## 2023-12-06 DIAGNOSIS — L570 Actinic keratosis: Secondary | ICD-10-CM | POA: Diagnosis not present

## 2023-12-27 ENCOUNTER — Encounter: Payer: Self-pay | Admitting: Family Medicine

## 2024-01-15 DIAGNOSIS — M7061 Trochanteric bursitis, right hip: Secondary | ICD-10-CM | POA: Diagnosis not present

## 2024-01-15 DIAGNOSIS — Z96641 Presence of right artificial hip joint: Secondary | ICD-10-CM | POA: Diagnosis not present

## 2024-01-19 DIAGNOSIS — H25013 Cortical age-related cataract, bilateral: Secondary | ICD-10-CM | POA: Diagnosis not present

## 2024-01-19 DIAGNOSIS — I10 Essential (primary) hypertension: Secondary | ICD-10-CM | POA: Diagnosis not present

## 2024-01-19 DIAGNOSIS — H25043 Posterior subcapsular polar age-related cataract, bilateral: Secondary | ICD-10-CM | POA: Diagnosis not present

## 2024-01-19 DIAGNOSIS — H2511 Age-related nuclear cataract, right eye: Secondary | ICD-10-CM | POA: Diagnosis not present

## 2024-01-19 DIAGNOSIS — H2513 Age-related nuclear cataract, bilateral: Secondary | ICD-10-CM | POA: Diagnosis not present

## 2024-02-02 ENCOUNTER — Ambulatory Visit: Payer: Medicare Other | Admitting: Primary Care

## 2024-02-02 ENCOUNTER — Encounter: Payer: Self-pay | Admitting: Primary Care

## 2024-02-02 VITALS — BP 136/64 | HR 68 | Temp 97.5°F | Ht 73.0 in | Wt 215.4 lb

## 2024-02-02 DIAGNOSIS — G4733 Obstructive sleep apnea (adult) (pediatric): Secondary | ICD-10-CM | POA: Diagnosis not present

## 2024-02-02 DIAGNOSIS — R3915 Urgency of urination: Secondary | ICD-10-CM | POA: Diagnosis not present

## 2024-02-02 DIAGNOSIS — R351 Nocturia: Secondary | ICD-10-CM | POA: Diagnosis not present

## 2024-02-02 DIAGNOSIS — N50811 Right testicular pain: Secondary | ICD-10-CM | POA: Diagnosis not present

## 2024-02-02 DIAGNOSIS — N401 Enlarged prostate with lower urinary tract symptoms: Secondary | ICD-10-CM | POA: Diagnosis not present

## 2024-02-02 NOTE — Patient Instructions (Signed)
  VISIT SUMMARY: You came in today for a follow-up visit regarding your mild obstructive sleep apnea. We discussed your current CPAP therapy and the issues you are experiencing with snoring and occasional air leaks from your mask.  YOUR PLAN: -OBSTRUCTIVE SLEEP APNEA: Obstructive sleep apnea is a condition where your breathing stops and starts repeatedly during sleep due to blocked airways. We will adjust your CPAP pressure settings to a maximum of 15 cm H2O to help reduce snoring. Additionally, we will order a chin strap to help keep your mouth closed during sleep and recommend using CPAP pillows designed for side sleepers to prevent mask movement. Please renew your CPAP supplies through the medical supply store. If snoring continues despite these adjustments, we may need to schedule a formal mask fitting at the sleep lab at [facility].  INSTRUCTIONS: Please follow up with us  if your snoring persists despite the adjustments. Renew your CPAP supplies as needed and consider using the recommended CPAP pillows and chin strap. If necessary, we will arrange a formal mask fitting at the sleep lab at [facility].  Orders: Renew CPAP supplies, add chin strap Adjust CPAP pressure settings 5-15cm h20   Follow-up 1 year with Beth NP or sooner if needed

## 2024-02-02 NOTE — Progress Notes (Signed)
 @Patient  ID: Alex Weiss, male    DOB: April 14, 1940, 83 y.o.   MRN: [id number]  Chief Complaint  Patient presents with   Obstructive Sleep Apnea    CPAP F/U. Pt states his wife has to push his chin up during the night due to snoring     Referring provider: Johnny Garnette LABOR, MD  HPI: 83 year old male, former smoker. PMH significant for hypertension, OSA, ulcerative colitis, osteoarthritis, total right hip arthroplasty. HST 01/2016 mild overall with 13 events per hour, severe on his back with 33 events per hour.  02/02/2024 Discussed the use of AI scribe software for clinical note transcription with the patient, who gave verbal consent to proceed. History of Present Illness Alex Weiss is an 83 year old male with sleep apnea who presents for a follow-up visit.  He has a history of mild sleep apnea, diagnosed in 2017 with a home sleep study showing 13 apneic events per hour. He has been using a CPAP machine for over ten years and received a replacement machine last year. His current CPAP settings are 5 to 13 cm H2O, but he continues to experience snoring, which his wife has noticed. He experiences occasional air leaks from the mask, which may be contributing to the snoring.  He uses a full face mask with his CPAP and changes the mask cushions regularly, most recently a month ago. He has not lost weight in the past year, which could affect mask fit. He sleeps mostly on his side, which sometimes causes the mask to move. He has not experienced waking up gasping or choking, and his wife primarily notices the snoring which can be disruptive.   He typically goes to bed around 11:00 to 11:30 PM and falls asleep within 5 to 10 minutes. He wakes up a couple of times during the night to urinate and rises around 6:30 to 7:00 AM. During the day, he takes a nap but remains active and does not fall asleep while driving.  He uses a [employer] full face mask and has always experienced some air  leaks.      Allergies  Allergen Reactions   Ivp Dye [Iodinated Contrast Media] Hives   Penicillins Hives   Clindamycin/Lincomycin Rash    Immunization History  Administered Date(s) Administered   Fluad Quad(high Dose 65+) 01/26/2021, 12/22/2021, 12/27/2023   INFLUENZA, HIGH DOSE SEASONAL PF 01/05/2016, 01/06/2017, 01/02/2018, 12/20/2019, 01/23/2023   Influenza Split 02/09/2011, 01/20/2012, 01/28/2013   Influenza Whole 12/28/2007   Influenza-Unspecified 02/03/2007, 01/15/2014, 01/07/2015, 12/11/2018   Moderna Covid-19 Fall Seasonal Vaccine 83yrs & older 12/27/2023   PFIZER Comirnaty(Gray Top)Covid-19 Tri-Sucrose Vaccine 12/24/2021   PFIZER(Purple Top)SARS-COV-2 Vaccination 04/18/2019, 05/08/2019, 12/20/2019, 07/21/2020, 12/28/2020   PNEUMOCOCCAL CONJUGATE-20 12/22/2023   Pfizer(Comirnaty)Fall Seasonal Vaccine 12 years and older 01/23/2023   Pneumococcal Conjugate-13 08/15/2013   Pneumococcal Polysaccharide-23 08/03/2006, 08/02/2012   Respiratory Syncytial Virus Vaccine,Recomb Aduvanted(Arexvy) 12/22/2023   Tdap 08/15/2013   Zoster Recombinant(Shingrix) 10/18/2019, 01/23/2020   Zoster, Live 08/03/2007    Past Medical History:  Diagnosis Date   Allergy    Arthritis    BPH (benign prostatic hypertrophy)    sees Dr. Norleen Seltzer    Cataract    starting    FH: colonic polyps    GERD (gastroesophageal reflux disease)    Hematuria, microscopic    History of kidney stones    History of nephrolithiasis    1962   History of prostatitis    History of ulcerative colitis  Hypertension    OSA (obstructive sleep apnea)    Premature atrial contraction    PVC's (premature ventricular contractions)    Sleep apnea    uses CPAP   Wears glasses     Tobacco History: Social History   Tobacco Use  Smoking Status Former   Current packs/day: 0.00   Average packs/day: 2.0 packs/day for 17.0 years (34.0 ttl pk-yrs)   Types: Cigarettes   Start date: 04/04/1958   Quit date: 04/05/1975    Years since quitting: 48.8  Smokeless Tobacco Never   Counseling given: Not Answered   Outpatient Medications Prior to Visit  Medication Sig Dispense Refill   Calcium  Carb-Cholecalciferol (CALCIUM  600 + D PO) Take 1 tablet by mouth daily.     cetirizine (ZYRTEC) 10 MG tablet Take 10 mg by mouth daily as needed for allergies.     famotidine (PEPCID) 20 MG tablet Take 20 mg by mouth 2 (two) times daily.      losartan  (COZAAR ) 25 MG tablet Take 1 tablet (25 mg total) by mouth daily. 90 tablet 3   metaxalone  (SKELAXIN ) 400 MG tablet Take 2 tablets (800 mg total) by mouth 3 (three) times daily as needed (muscle spasms). 120 tablet 0   metroNIDAZOLE (METROGEL) 0.75 % gel Apply 1 Application topically 2 (two) times daily as needed (rosacea).     Multiple Vitamin (MULTIVITAMIN) tablet Take 1 tablet by mouth daily.     silodosin (RAPAFLO) 8 MG CAPS capsule Take 8 mg by mouth daily.     sulindac  (CLINORIL ) 200 MG tablet Take 1 tablet (200 mg total) by mouth daily. 90 tablet 3   triamcinolone  cream (KENALOG ) 0.1 % Apply 1 Application topically 2 (two) times daily as needed (outter ear itching & dryness). 90 g 5   No facility-administered medications prior to visit.   Review of Systems  Review of Systems  Constitutional: Negative.   Respiratory: Negative.    Psychiatric/Behavioral: Negative.      Physical Exam  BP 136/64   Pulse 68   Temp (!) 97.5 F (36.4 C)   Ht 6' 1 (1.854 m)   Wt 215 lb 6.4 oz (97.7 kg)   SpO2 96% Comment: ra  BMI 28.42 kg/m  Physical Exam Constitutional:      Appearance: Normal appearance. He is well-developed.  HENT:     Head: Normocephalic and atraumatic.     Mouth/Throat:     Mouth: Mucous membranes are moist.     Pharynx: Oropharynx is clear.  Cardiovascular:     Rate and Rhythm: Normal rate and regular rhythm.     Heart sounds: Normal heart sounds.  Pulmonary:     Effort: Pulmonary effort is normal. No respiratory distress.     Breath sounds:  Normal breath sounds. No wheezing or rhonchi.  Musculoskeletal:        General: Normal range of motion.     Cervical back: Normal range of motion and neck supple.  Skin:    General: Skin is warm and dry.     Findings: No erythema or rash.  Neurological:     General: No focal deficit present.     Mental Status: He is alert and oriented to person, place, and time. Mental status is at baseline.  Psychiatric:        Mood and Affect: Mood normal.        Behavior: Behavior normal.        Thought Content: Thought content normal.  Judgment: Judgment normal.    Lab Results:  CBC    Component Value Date/Time   WBC 3.9 (L) 10/30/2023 0955   RBC 4.99 10/30/2023 0955   HGB 16.1 10/30/2023 0955   HCT 46.5 10/30/2023 0955   PLT 121.0 (L) 10/30/2023 0955   MCV 93.2 10/30/2023 0955   MCH 32.9 02/28/2022 0904   MCHC 34.7 10/30/2023 0955   RDW 12.8 10/30/2023 0955   LYMPHSABS 0.7 10/30/2023 0955   MONOABS 0.4 10/30/2023 0955   EOSABS 0.2 10/30/2023 0955   BASOSABS 0.0 10/30/2023 0955    BMET    Component Value Date/Time   NA 140 10/30/2023 0955   K 4.3 10/30/2023 0955   CL 105 10/30/2023 0955   CO2 29 10/30/2023 0955   GLUCOSE 115 (H) 10/30/2023 0955   BUN 25 (H) 10/30/2023 0955   CREATININE 0.84 10/30/2023 0955   CREATININE 0.94 10/14/2019 0852   CALCIUM  9.3 10/30/2023 0955   GFRNONAA >60 02/28/2022 0904   GFRAA >90 05/19/2014 1300    BNP No results found for: BNP  ProBNP No results found for: PROBNP  Imaging: No results found.   Assessment & Plan:   1. OSA (obstructive sleep apnea) (Primary)  Assessment and Plan Assessment & Plan Obstructive sleep apnea Obstructive sleep apnea, mild severity, diagnosed via home sleep study in 2017 with 13 apneic events per hour. Currently using CPAP therapy for over ten years. Recent CPAP machine replacement last year. Current settings are 5 to 13 cm H2O, with an apnea score of 3.3, indicating well-controlled apnea.  Occasional snoring, possibly due to air leaks from the mask. No significant weight loss affecting mask fit. No reports of waking up gasping or choking, but snoring is disruptive to his wife. Side sleeping may contribute to mask movement and air leaks. - Adjust CPAP pressure settings, increasing the maximum pressure to 15 cm H2O. - Order a chin strap to help maintain mouth closure during sleep. - Advise on the use of CPAP pillows designed for side sleepers to prevent mask movement. - Renew CPAP supplies through the medical supply store. - If snoring persists despite pressure adjustment and chin strap, consider a formal mask fitting at the sleep lab at [facility].   Almarie LELON Ferrari, NP 02/02/2024

## 2024-02-20 ENCOUNTER — Telehealth: Payer: Self-pay

## 2024-02-20 NOTE — Telephone Encounter (Signed)
 Copied from CRM #[id number]. Topic: Clinical - Order For Equipment >> Feb 19, 2024 12:50 PM Leila BROCKS wrote: Reason for CRM: Patient [phone] states after summary 02/02/24 with NP, [facility], renew CPAP supplies, add chin strap; Adjust CPAP pressure settings 5-15cm h20. Patient states has not heard from anyone. Patient does not know the name of the DME. Patient usually call the phone number is [phone] for his cpap supplies. I looked it up and it belongs to [facility]. Per CAL, [facility] [phone] and if patient is still having trouble call again and will send to [facility]. Patient states why does he have to change his DME company, patient has been using [facility] and wants to have clarification. Please call back.  [facility] can we figure out why this order was sent to [facility] instead of [facility] who the pt is already established with?

## 2024-02-20 NOTE — Telephone Encounter (Signed)
 It was originally sent to [facility] because the referral stated [facility] as the DME. We misunderstood the referral, and we have now forwarded it to [facility]. Nothing Further needed

## 2024-02-26 DIAGNOSIS — N5082 Scrotal pain: Secondary | ICD-10-CM | POA: Diagnosis not present
